# Patient Record
Sex: Female | Born: 1940 | Race: White | Hispanic: No | Smoking: Never smoker
Health system: Southern US, Community
[De-identification: ages and names within clinical notes are randomized; demographics above are authoritative.]

## PROBLEM LIST (undated history)

## (undated) DIAGNOSIS — E119 Type 2 diabetes mellitus without complications: Secondary | ICD-10-CM

## (undated) DIAGNOSIS — G4730 Sleep apnea, unspecified: Secondary | ICD-10-CM

## (undated) DIAGNOSIS — C189 Malignant neoplasm of colon, unspecified: Secondary | ICD-10-CM

## (undated) DIAGNOSIS — I820 Budd-Chiari syndrome: Secondary | ICD-10-CM

## (undated) DIAGNOSIS — G43809 Other migraine, not intractable, without status migrainosus: Secondary | ICD-10-CM

## (undated) DIAGNOSIS — E079 Disorder of thyroid, unspecified: Secondary | ICD-10-CM

## (undated) DIAGNOSIS — K219 Gastro-esophageal reflux disease without esophagitis: Secondary | ICD-10-CM

## (undated) DIAGNOSIS — K55069 Acute infarction of intestine, part and extent unspecified: Secondary | ICD-10-CM

## (undated) DIAGNOSIS — D649 Anemia, unspecified: Secondary | ICD-10-CM

## (undated) HISTORY — PX: UPPER GASTROINTESTINAL ENDOSCOPY: SHX188

## (undated) HISTORY — DX: Other migraine, not intractable, without status migrainosus: G43.809

## (undated) HISTORY — DX: Gastro-esophageal reflux disease without esophagitis: K21.9

## (undated) HISTORY — DX: Disorder of thyroid, unspecified: E07.9

## (undated) HISTORY — DX: Budd-Chiari syndrome: I82.0

## (undated) HISTORY — PX: COLON RESECTION: SHX5231

## (undated) HISTORY — PX: PORTACATH PLACEMENT: SHX2246

## (undated) HISTORY — DX: Type 2 diabetes mellitus without complications: E11.9

## (undated) HISTORY — DX: Acute infarction of intestine, part and extent unspecified: K55.069

## (undated) HISTORY — PX: COLONOSCOPY: SHX174

## (undated) HISTORY — DX: Malignant neoplasm of colon, unspecified: C18.9

## (undated) HISTORY — PX: SHOULDER ARTHROSCOPY: SHX128

## (undated) HISTORY — DX: Anemia, unspecified: D64.9

## (undated) HISTORY — DX: Sleep apnea, unspecified: G47.30

---

## 1985-11-19 HISTORY — PX: ABDOMINAL HYSTERECTOMY: SHX81

## 1986-11-19 DIAGNOSIS — C189 Malignant neoplasm of colon, unspecified: Secondary | ICD-10-CM

## 1986-11-19 HISTORY — DX: Malignant neoplasm of colon, unspecified: C18.9

## 2012-12-02 ENCOUNTER — Encounter: Payer: Self-pay | Admitting: Internal Medicine

## 2012-12-24 ENCOUNTER — Ambulatory Visit (INDEPENDENT_AMBULATORY_CARE_PROVIDER_SITE_OTHER): Payer: MEDICARE | Admitting: Internal Medicine

## 2012-12-24 ENCOUNTER — Encounter: Payer: Self-pay | Admitting: Internal Medicine

## 2012-12-24 VITALS — BP 118/62 | HR 64 | Ht 65.0 in | Wt 184.0 lb

## 2012-12-24 DIAGNOSIS — D509 Iron deficiency anemia, unspecified: Secondary | ICD-10-CM

## 2012-12-24 DIAGNOSIS — R195 Other fecal abnormalities: Secondary | ICD-10-CM

## 2012-12-24 HISTORY — DX: Iron deficiency anemia, unspecified: D50.9

## 2012-12-24 MED ORDER — NA SULFATE-K SULFATE-MG SULF 17.5-3.13-1.6 GM/177ML PO SOLN: ORAL | Status: DC

## 2012-12-24 NOTE — Patient Instructions (Addendum)
You have been scheduled for an endoscopy and colonoscopy with propofol. Please follow the written instructions given to you at your visit today. Please use the suprep kit you have been given today. If you use inhalers (even only as needed) or a CPAP machine, please bring them with you on the day of your procedure.  Thank you for choosing me and [facility].  Iva Boop, M.D., [facility]

## 2012-12-24 NOTE — Progress Notes (Signed)
Subjective:    Patient ID: Cristina Santos, female    DOB: 22-Jun-1941, 72 y.o.   MRN: [id number]  HPI Very nice elderly white woman here with her husband. She has a history of resection of colon cancer 25 years ago at [facility] in [place], [place]. Her daughter works as a Publishing rights manager there. This was followed by one year of chemotherapy. She apparently had no positive disease. In over time her port was not removed, and it remains in. She still follows with a hematologist oncologist and was found to be mildly anemic with microcytosis and had a low ferritin recently. An immune fecal occult blood test was positive. She has not seen any bleeding though indicates that she did have some dark stools at one point though she thought that that was probably related to ingestion of turnip greens. She was started on iron and stools have been dark since then.  Her last colonoscopy was October of 2009 was reportedly normal. She had an EGD at that time that showed fundic gland polyps was otherwise unremarkable. GI review of systems is otherwise negative. He has lost weight for reasons that are unclear to her at this point. She does say her appetite is improved after starting on the iron therapy. Allergies  Allergen Reactions  . Codeine   . Latex    No outpatient prescriptions prior to visit.    Last reviewed on 12/24/2012 10:58 AM by Iva Boop, MD Past Medical History  Diagnosis Date  . Thyroid disease   . GERD (gastroesophageal reflux disease)   . Diabetes   . Colon cancer 1988  . Sleep apnea    Past Surgical History  Procedure Date  . Abdominal hysterectomy 1987  . Shoulder arthroscopy     left  . Portacath placement   . Colon resection   . Upper gastrointestinal endoscopy   . Colonoscopy    History   Social History  . Marital Status:  married     Spouse Name: N/A    Number of Children: 2  . Years of Education: N/A   Occupational History  . retired    Social  History Main Topics  . Smoking status: Never Smoker   . Smokeless tobacco: Never Used  . Alcohol Use: No  . Drug Use: No    Social History Narrative   Married, 1 son one daughter. Daughter is a Publishing rights manager.She is retired2 cups coffee per day   Family History  Problem Relation Age of Onset  . Prostate cancer Brother   . Diabetes Mother   . Diabetes Brother   . Heart attack Brother   . Lung cancer Brother   . Bone cancer Brother   . Alzheimer's disease Mother        Review of Systems As per history of present illness all her systems are negative.    Objective:   Physical Exam General:  Well-developed, well-nourished and in no acute distress Eyes:  anicteric. Neck:   supple w/o thyromegaly or mass.  Lungs: Clear to auscultation bilaterally. Heart:  S1S2, no rubs, murmurs, gallops. Abdomen:  soft, non-tender, no hepatosplenomegaly, hernia, or mass and BS+.  Rectal: Deferred until colonoscopy Lymph:  no cervical or supraclavicular adenopathy. Extremities:   no edema Skin   no rash. Neuro:  A&O x 3.  Psych:  appropriate mood and  Affect.   Data Reviewed: As above Ferritin 22 11/20/12  On 12/08/2012 her hemoglobin was 12 with an MCV of 85 so  she has responded nicely to the iron supplementation she started.       Assessment & Plan:   1. iFOBT + stool  Ambulatory referral to Gastroenterology  2. Iron deficiency anemia  Ambulatory referral to Gastroenterology  3. Personal hx colon cancer resected 25 yrs ago   1. These problems may investigation with colonoscopy and possible upper endoscopy. The immune based fecal occult blood test is specific for colonic hemoglobin. 2. Will start with colonoscopy if that is unrevealing then would pursue an upper GI endoscopy. She believes she eats iron in her diet. The risks and benefits as well as alternatives of endoscopic procedure(s) have been discussed and reviewed. All questions answered. The patient agrees to  proceed.   I appreciate the opportunity to care for this patient.  ZO:XWRUEAV, Esperanza Sheets, MD and Elliot Dally, MD

## 2012-12-30 ENCOUNTER — Encounter: Payer: Self-pay | Admitting: Internal Medicine

## 2012-12-30 ENCOUNTER — Ambulatory Visit (AMBULATORY_SURGERY_CENTER): Payer: MEDICARE | Admitting: Internal Medicine

## 2012-12-30 VITALS — BP 137/62 | HR 61 | Temp 97.0°F | Resp 19 | Ht 65.0 in | Wt 184.0 lb

## 2012-12-30 DIAGNOSIS — D131 Benign neoplasm of stomach: Secondary | ICD-10-CM

## 2012-12-30 DIAGNOSIS — D126 Benign neoplasm of colon, unspecified: Secondary | ICD-10-CM

## 2012-12-30 DIAGNOSIS — D509 Iron deficiency anemia, unspecified: Secondary | ICD-10-CM

## 2012-12-30 DIAGNOSIS — K5730 Diverticulosis of large intestine without perforation or abscess without bleeding: Secondary | ICD-10-CM

## 2012-12-30 DIAGNOSIS — R195 Other fecal abnormalities: Secondary | ICD-10-CM

## 2012-12-30 DIAGNOSIS — Z85038 Personal history of other malignant neoplasm of large intestine: Secondary | ICD-10-CM

## 2012-12-30 DIAGNOSIS — K648 Other hemorrhoids: Secondary | ICD-10-CM

## 2012-12-30 MED ORDER — SODIUM CHLORIDE 0.9 % IV SOLN: 500.0000 mL | INTRAVENOUS | Status: DC

## 2012-12-30 NOTE — Patient Instructions (Addendum)
I found and removed 9 colon polyps, all small or medium size. They look benign. One tiny polyp was not recovered, the others were and will be analyzed by a pathologist.  You also have small internal hemorrhoids and mild diverticulosis.  The upper endoscopy exam showed multiple small polyps (fundic gland polyps) that we knew you had - these are not a problem.  I will let you know results and plans by mail and/or phone.  Thank you for choosing me and [facility].  Iva Boop, MD, FACG   YOU HAD AN ENDOSCOPIC PROCEDURE TODAY AT THE [facility]: Refer to the procedure report that was given to you for any specific questions about what was found during the examination.  If the procedure report does not answer your questions, please call your gastroenterologist to clarify.  If you requested that your care partner not be given the details of your procedure findings, then the procedure report has been included in a sealed envelope for you to review at your convenience later.  YOU SHOULD EXPECT: Some feelings of bloating in the abdomen. Passage of more gas than usual.  Walking can help get rid of the air that was put into your GI tract during the procedure and reduce the bloating. If you had a lower endoscopy (such as a colonoscopy or flexible sigmoidoscopy) you may notice spotting of blood in your stool or on the toilet paper. If you underwent a bowel prep for your procedure, then you may not have a normal bowel movement for a few days.  DIET: Your first meal following the procedure should be a light meal and then it is ok to progress to your normal diet.  A half-sandwich or bowl of soup is an example of a good first meal.  Heavy or fried foods are harder to digest and may make you feel nauseous or bloated.  Likewise meals heavy in dairy and vegetables can cause extra gas to form and this can also increase the bloating.  Drink plenty of fluids but you should avoid alcoholic  beverages for 24 hours.  ACTIVITY: Your care partner should take you home directly after the procedure.  You should plan to take it easy, moving slowly for the rest of the day.  You can resume normal activity the day after the procedure however you should NOT DRIVE or use heavy machinery for 24 hours (because of the sedation medicines used during the test).    SYMPTOMS TO REPORT IMMEDIATELY: A gastroenterologist can be reached at any hour.  During normal business hours, 8:30 AM to 5:00 PM Monday through Friday, call [phone].  After hours and on weekends, please call the GI answering service at [phone] who will take a message and have the physician on call contact you.   Following lower endoscopy (colonoscopy or flexible sigmoidoscopy):  Excessive amounts of blood in the stool  Significant tenderness or worsening of abdominal pains  Swelling of the abdomen that is new, acute  Fever of 100F or higher  Following upper endoscopy (EGD)  Vomiting of blood or coffee ground material  New chest pain or pain under the shoulder blades  Painful or persistently difficult swallowing  New shortness of breath  Fever of 100F or higher  Black, tarry-looking stools  FOLLOW UP: If any biopsies were taken you will be contacted by phone or by letter within the next 1-3 weeks.  Call your gastroenterologist if you have not heard about the biopsies in 3 weeks.  Our staff will call the home number listed on your records the next business day following your procedure to check on you and address any questions or concerns that you may have at that time regarding the information given to you following your procedure. This is a courtesy call and so if there is no answer at the home number and we have not heard from you through the emergency physician on call, we will assume that you have returned to your regular daily activities without incident.  SIGNATURES/CONFIDENTIALITY: You and/or your care partner  have signed paperwork which will be entered into your electronic medical record.  These signatures attest to the fact that that the information above on your After Visit Summary has been reviewed and is understood.  Full responsibility of the confidentiality of this discharge information lies with you and/or your care-partner.  Please continue your normal medications  YOU HAD AN ENDOSCOPIC PROCEDURE TODAY AT THE [facility]: Refer to the procedure report that was given to you for any specific questions about what was found during the examination.  If the procedure report does not answer your questions, please call your gastroenterologist to clarify.  If you requested that your care partner not be given the details of your procedure findings, then the procedure report has been included in a sealed envelope for you to review at your convenience later.  YOU SHOULD EXPECT: Some feelings of bloating in the abdomen. Passage of more gas than usual.  Walking can help get rid of the air that was put into your GI tract during the procedure and reduce the bloating. If you had a lower endoscopy (such as a colonoscopy or flexible sigmoidoscopy) you may notice spotting of blood in your stool or on the toilet paper. If you underwent a bowel prep for your procedure, then you may not have a normal bowel movement for a few days.  DIET: Your first meal following the procedure should be a light meal and then it is ok to progress to your normal diet.  A half-sandwich or bowl of soup is an example of a good first meal.  Heavy or fried foods are harder to digest and may make you feel nauseous or bloated.  Likewise meals heavy in dairy and vegetables can cause extra gas to form and this can also increase the bloating.  Drink plenty of fluids but you should avoid alcoholic beverages for 24 hours.  ACTIVITY: Your care partner should take you home directly after the procedure.  You should plan to take it easy, moving  slowly for the rest of the day.  You can resume normal activity the day after the procedure however you should NOT DRIVE or use heavy machinery for 24 hours (because of the sedation medicines used during the test).    SYMPTOMS TO REPORT IMMEDIATELY: A gastroenterologist can be reached at any hour.  During normal business hours, 8:30 AM to 5:00 PM Monday through Friday, call [phone].  After hours and on weekends, please call the GI answering service at [phone] who will take a message and have the physician on call contact you.   Following lower endoscopy (colonoscopy or flexible sigmoidoscopy):  Excessive amounts of blood in the stool  Significant tenderness or worsening of abdominal pains  Swelling of the abdomen that is new, acute  Fever of 100F or higher  Following upper endoscopy (EGD)  Vomiting of blood or coffee ground material  New chest pain or pain under the shoulder  blades  Painful or persistently difficult swallowing  New shortness of breath  Fever of 100F or higher  Black, tarry-looking stools  FOLLOW UP: If any biopsies were taken you will be contacted by phone or by letter within the next 1-3 weeks.  Call your gastroenterologist if you have not heard about the biopsies in 3 weeks.  Our staff will call the home number listed on your records the next business day following your procedure to check on you and address any questions or concerns that you may have at that time regarding the information given to you following your procedure. This is a courtesy call and so if there is no answer at the home number and we have not heard from you through the emergency physician on call, we will assume that you have returned to your regular daily activities without incident.  SIGNATURES/CONFIDENTIALITY: You and/or your care partner have signed paperwork which will be entered into your electronic medical record.  These signatures attest to the fact that that the information  above on your After Visit Summary has been reviewed and is understood.  Full responsibility of the confidentiality of this discharge information lies with you and/or your care-partner.  Dr. Marvell Fuller office will call you with the colon results and plan

## 2012-12-30 NOTE — Progress Notes (Signed)
Patient did not experience any of the following events: a burn prior to discharge; a fall within the facility; wrong site/side/patient/procedure/implant event; or a hospital transfer or hospital admission upon discharge from the facility. (G8907) Patient did not have preoperative order for IV antibiotic SSI prophylaxis. (G8918)  

## 2012-12-30 NOTE — Progress Notes (Signed)
Lidocaine-40mg IV prior to Propofol InductionPropofol given over incremental dosages 

## 2012-12-30 NOTE — Op Note (Signed)
 [facility] [address] [place], [address]   COLONOSCOPY PROCEDURE REPORT  PATIENT: Cristina Santos, Cristina Santos  MR#: [id number] BIRTHDATE: [birth year] , 71  yrs. old GENDER: Female ENDOSCOPIST: Iva Boop, MD, [facility]  PROCEDURE DATE:  12/30/2012 PROCEDURE:   Colonoscopy with biopsy and snare polypectomy ASA CLASS:   Class II INDICATIONS:Iron Deficiency Anemia, iFOBT + stool, and High risk patient with personal history of colon cancer. MEDICATIONS: propofol (Diprivan) 200mg  IV, MAC sedation, administered by CRNA, and These medications were titrated to patient response per physician's verbal order  DESCRIPTION OF PROCEDURE:   After the risks benefits and alternatives of the procedure were thoroughly explained, informed consent was obtained.  A digital rectal exam revealed no abnormalities of the rectum.   The LB CF-H180AL [id number]  endoscope was introduced through the anus and advanced to the terminal ileum which was intubated for a short distance. No adverse events experienced.   The quality of the prep was Suprep excellent  The instrument was then slowly withdrawn as the colon was fully examined.      COLON FINDINGS: Nine polypoid shaped sessile polyps measuring 1-8 mm in size were found at the cecum (2 cold bx removal), in the ascending colon, transverse colon, at the splenic flexure, in the descending colon, and sigmoid colon (all these cold snare removal). A polypectomy was performed with cold forceps and with a cold snare.  The resection was complete and the polyp tissue was partially retrieved, one polyp from ascending colon (3-22mm) not retrieved. All others retrieved.   There was rare diverticulosis noted in the sigmoid colon.   Small internal hemorrhoids were found.   The mucosa appeared normal in the distal terminal ileum. The colon mucosa was otherwise normal.  Retroflexed views revealed internal hemorrhoids. The time to cecum=2 minutes 40  seconds. Withdrawal time=15 minutes 40 seconds.  The scope was withdrawn and the procedure completed. COMPLICATIONS: There were no complications.  ENDOSCOPIC IMPRESSION: 1.   Nine sessile polyps measuring 1-8 mm in size were found at the cecum, in the ascending colon, transverse colon, at the splenic flexure, in the descending colon, and sigmoid colon; polypectomy was performed with cold forceps and with a cold snare 2.   There was mild diverticulosis noted in the sigmoid colon 3.   Small internal hemorrhoids 4.   Normal mucosa in the distal  terminal ileum 5.   The colon mucosa was otherwise normal - excellent prep  RECOMMENDATIONS: Upper endoscopy next as cannot say cause of anemia found here.   eSigned:  Iva Boop, MD, [facility] 12/30/2012 4:24 PM   cc: The Patient, Donnetta Hutching, MD and Elliot Dally, MD   PATIENT NAME:  Cristina Santos, Cristina Santos MR#: [id number]

## 2012-12-30 NOTE — Op Note (Signed)
[facility] [address] [place], [address]   ENDOSCOPY PROCEDURE REPORT  PATIENT: Cristina, Santos  MR#: [id number] BIRTHDATE: August 08, 1941 , 71  yrs. old GENDER: Female ENDOSCOPIST: Iva Boop, MD, [facility] PROCEDURE DATE:  12/30/2012 PROCEDURE:  EGD, diagnostic ASA CLASS:     Class II INDICATIONS:  Unexplained iron deficiency anemia. MEDICATIONS: propofol (Diprivan) 50mg  IV, MAC sedation, administered by CRNA, These medications were titrated to patient response per physician's verbal order, There was residual sedation effect present from prior procedure, and Robinul 0.2 mg IV TOPICAL ANESTHETIC: Cetacaine Spray  DESCRIPTION OF PROCEDURE: After the risks benefits and alternatives of the procedure were thoroughly explained, informed consent was obtained.  The LB GIF-H180 [id number] endoscope was introduced through the mouth and advanced to the second portion of the duodenum. Without limitations.  The instrument was slowly withdrawn as the mucosa was fully examined.        STOMACH: Multiple diminutive smooth sessile polyps were found in the gastric body and gastric fundus.  The remainder of the upper endoscopy exam was otherwise normal. Retroflexed views revealed no abnormalities.     The scope was then withdrawn from the patient and the procedure completed.  COMPLICATIONS: There were no complications. ENDOSCOPIC IMPRESSION: 1.   Multiple diminutive sessile polyps were found in the gastric body and gastric fundus 2.   The remainder of the upper endoscopy exam was otherwise normal  RECOMMENDATIONS: Office will call with results from colon pathology and plans. will discuss options - could need guaiac-based hemoccults to see if small bowel capsule endoscopy needed - could consider celiac serology also.   eSigned:  Iva Boop, MD, [facility] 12/30/2012 4:31 PM  CC:The Patient  , Donnetta Hutching, MD and Genia Plants, MD

## 2012-12-30 NOTE — Progress Notes (Addendum)
Called to room to assist during endoscopic procedure.  Patient ID and intended procedure confirmed with present staff. Received instructions for my participation in the procedure from the performing physician.  Total of 8 polyps retrieved in colon.

## 2012-12-31 ENCOUNTER — Telehealth: Payer: Self-pay | Admitting: *Deleted

## 2012-12-31 NOTE — Telephone Encounter (Signed)
  Follow up Call-  Call back number 12/30/2012  Post procedure Call Back phone  # [phone]  Permission to leave phone message Yes     Patient questions:  Do you have a fever, pain , or abdominal swelling? no Pain Score  0 *  Have you tolerated food without any problems? yes  Have you been able to return to your normal activities? yes  Do you have any questions about your discharge instructions: Diet   no Medications  no Follow up visit  no  Do you have questions or concerns about your Care? no  Actions: * If pain score is 4 or above: No action needed, pain <4.

## 2013-01-06 ENCOUNTER — Encounter: Payer: Self-pay | Admitting: Internal Medicine

## 2013-01-06 DIAGNOSIS — Z85038 Personal history of other malignant neoplasm of large intestine: Secondary | ICD-10-CM | POA: Insufficient documentation

## 2013-01-06 NOTE — Progress Notes (Signed)
Quick Note:  6 adenomas Repeat colonoscopy 3 years - 12/2015   Office to call  Also needs guaiac-based hemoccults - wait until 3 weeks after colonoscopy to do and TTG Ab and an Ig A level to evaluate iron-deficiency anemia  [facility] no letter - place the recall please ______

## 2013-01-07 ENCOUNTER — Other Ambulatory Visit: Payer: Self-pay

## 2013-01-07 DIAGNOSIS — D649 Anemia, unspecified: Secondary | ICD-10-CM

## 2013-01-26 ENCOUNTER — Other Ambulatory Visit (INDEPENDENT_AMBULATORY_CARE_PROVIDER_SITE_OTHER): Payer: MEDICARE

## 2013-01-26 DIAGNOSIS — D649 Anemia, unspecified: Secondary | ICD-10-CM

## 2013-01-26 LAB — IGA: IgA: 176 mg/dL (ref 68–378)

## 2013-01-27 LAB — TISSUE TRANSGLUTAMINASE, IGA: Tissue Transglutaminase Ab, IgA: 3.8 U/mL (ref <20)

## 2013-01-28 NOTE — Progress Notes (Signed)
Quick Note:  Labs negative for celiac dz Waiting on hemoccults ______

## 2013-02-09 ENCOUNTER — Telehealth: Payer: Self-pay | Admitting: Internal Medicine

## 2013-02-09 NOTE — Telephone Encounter (Signed)
Spoke with patient and gave her results of blood work. She states she mailed in her hemoccult cards also.

## 2013-02-09 NOTE — Telephone Encounter (Signed)
Lab has not received stool cards. Left a message for patient to call me.

## 2013-02-10 NOTE — Telephone Encounter (Signed)
Spoke with patient and told her the lab has not received her cards. Mailed her new cards.

## 2013-02-11 ENCOUNTER — Other Ambulatory Visit: Payer: MEDICARE

## 2013-02-11 DIAGNOSIS — D649 Anemia, unspecified: Secondary | ICD-10-CM

## 2013-02-11 LAB — HEMOCCULT SLIDES (X 3 CARDS)
Fecal Occult Blood: NEGATIVE
OCCULT 1: NEGATIVE
OCCULT 2: NEGATIVE
OCCULT 3: NEGATIVE
OCCULT 4: NEGATIVE
OCCULT 5: NEGATIVE

## 2013-02-12 NOTE — Progress Notes (Signed)
Quick Note:  No blood in stools So I do not recommend further testing Take iron supplements as directed and f/u hematologist and PCP  See me as planned for recall and prn ______

## 2016-01-03 ENCOUNTER — Encounter: Payer: Self-pay | Admitting: Internal Medicine

## 2016-02-29 ENCOUNTER — Ambulatory Visit (AMBULATORY_SURGERY_CENTER): Payer: Self-pay

## 2016-02-29 VITALS — Ht 65.0 in | Wt 200.8 lb

## 2016-02-29 DIAGNOSIS — C189 Malignant neoplasm of colon, unspecified: Secondary | ICD-10-CM

## 2016-02-29 NOTE — Progress Notes (Signed)
No allergies to eggs or soy No past problems with anesthesia No diet/weight loss meds No home oxygen  Has email and internet; refused emmi 

## 2016-03-14 ENCOUNTER — Ambulatory Visit (AMBULATORY_SURGERY_CENTER): Payer: MEDICARE | Admitting: Internal Medicine

## 2016-03-14 ENCOUNTER — Encounter: Payer: Self-pay | Admitting: Internal Medicine

## 2016-03-14 VITALS — BP 131/91 | HR 54 | Temp 97.8°F | Resp 18 | Ht 65.0 in | Wt 200.0 lb

## 2016-03-14 DIAGNOSIS — D120 Benign neoplasm of cecum: Secondary | ICD-10-CM | POA: Diagnosis not present

## 2016-03-14 DIAGNOSIS — D124 Benign neoplasm of descending colon: Secondary | ICD-10-CM

## 2016-03-14 DIAGNOSIS — Z86010 Personal history of colonic polyps: Secondary | ICD-10-CM

## 2016-03-14 MED ORDER — SODIUM CHLORIDE 0.9 % IV SOLN: 500.0000 mL | INTRAVENOUS | Status: DC

## 2016-03-14 NOTE — Progress Notes (Signed)
Called to room to assist during endoscopic procedure.  Patient ID and intended procedure confirmed with present staff. Received instructions for my participation in the procedure from the performing physician.  

## 2016-03-14 NOTE — Patient Instructions (Addendum)
I found and removed 3 tiny polyps. I will let you know pathology results and when/if to have another routine colonoscopy by mail. Not sure you will need another at 34.  I appreciate the opportunity to care for you. Gatha Mayer, MD, [facility]   Discharge instructions given. Handouts on polyps and diverticulosis. Resume previous medications. YOU HAD AN ENDOSCOPIC PROCEDURE TODAY AT [facility]:   Refer to the procedure report that was given to you for any specific questions about what was found during the examination.  If the procedure report does not answer your questions, please call your gastroenterologist to clarify.  If you requested that your care partner not be given the details of your procedure findings, then the procedure report has been included in a sealed envelope for you to review at your convenience later.  YOU SHOULD EXPECT: Some feelings of bloating in the abdomen. Passage of more gas than usual.  Walking can help get rid of the air that was put into your GI tract during the procedure and reduce the bloating. If you had a lower endoscopy (such as a colonoscopy or flexible sigmoidoscopy) you may notice spotting of blood in your stool or on the toilet paper. If you underwent a bowel prep for your procedure, you may not have a normal bowel movement for a few days.  Please Note:  You might notice some irritation and congestion in your nose or some drainage.  This is from the oxygen used during your procedure.  There is no need for concern and it should clear up in a day or so.  SYMPTOMS TO REPORT IMMEDIATELY:   Following lower endoscopy (colonoscopy or flexible sigmoidoscopy):  Excessive amounts of blood in the stool  Significant tenderness or worsening of abdominal pains  Swelling of the abdomen that is new, acute  Fever of 100F or higher  For urgent or emergent issues, a gastroenterologist can be reached at any hour by calling [phone].   DIET:  Your first meal following the procedure should be a small meal and then it is ok to progress to your normal diet. Heavy or fried foods are harder to digest and may make you feel nauseous or bloated.  Likewise, meals heavy in dairy and vegetables can increase bloating.  Drink plenty of fluids but you should avoid alcoholic beverages for 24 hours.  ACTIVITY:  You should plan to take it easy for the rest of today and you should NOT DRIVE or use heavy machinery until tomorrow (because of the sedation medicines used during the test).    FOLLOW UP: Our staff will call the number listed on your records the next business day following your procedure to check on you and address any questions or concerns that you may have regarding the information given to you following your procedure. If we do not reach you, we will leave a message.  However, if you are feeling well and you are not experiencing any problems, there is no need to return our call.  We will assume that you have returned to your regular daily activities without incident.  If any biopsies were taken you will be contacted by phone or by letter within the next 1-3 weeks.  Please call us at [phone] if you have not heard about the biopsies in 3 weeks.    SIGNATURES/CONFIDENTIALITY: You and/or your care partner have signed paperwork which will be entered into your electronic medical record.  These signatures attest to the  fact that that the information above on your After Visit Summary has been reviewed and is understood.  Full responsibility of the confidentiality of this discharge information lies with you and/or your care-partner.

## 2016-03-14 NOTE — Progress Notes (Signed)
A and Ox 3 Report to RN 

## 2016-03-14 NOTE — Op Note (Signed)
[facility] Patient Name: Cristina Santos Procedure Date: 03/14/2016 11:24 AM MRN: [id number] Endoscopist: Gatha Mayer , MD Age: 75 Date of Birth: 01/11/1941 Gender: Female Procedure:                Colonoscopy Indications:              Surveillance: Personal history of adenomatous                            polyps on last colonoscopy 3 years ago Medicines:                Propofol per Anesthesia, Monitored Anesthesia Care Procedure:                Pre-Anesthesia Assessment:                           - Prior to the procedure, a History and Physical                            was performed, and patient medications and                            allergies were reviewed. The patient's tolerance of                            previous anesthesia was also reviewed. The risks                            and benefits of the procedure and the sedation                            options and risks were discussed with the patient.                            All questions were answered, and informed consent                            was obtained. Prior Anticoagulants: The patient has                            taken no previous anticoagulant or antiplatelet                            agents. ASA Grade Assessment: II - A patient with                            mild systemic disease. After reviewing the risks                            and benefits, the patient was deemed in                            satisfactory condition to undergo the procedure.  After obtaining informed consent, the colonoscope                            was passed under direct vision. Throughout the                            procedure, the patient's blood pressure, pulse, and                            oxygen saturations were monitored continuously. The                            Model CF-HQ190L [id number]) scope was introduced                            through the anus and advanced to the  the cecum,                            identified by appendiceal orifice and ileocecal                            valve. The colonoscopy was performed without                            difficulty. The patient tolerated the procedure                            well. The quality of the bowel preparation was                            excellent. The bowel preparation used was Miralax.                            The ileocecal valve, appendiceal orifice, and                            rectum were photographed. Scope In: 11:35:29 AM Scope Out: 11:52:02 AM Scope Withdrawal Time: 0 hours 14 minutes 45 seconds  Total Procedure Duration: 0 hours 16 minutes 33 seconds  Findings:                 The perianal and digital rectal examinations were                            normal.                           A 2 mm polyp was found in the cecum. The polyp was                            sessile. The polyp was removed with a cold biopsy                            forceps. Resection and retrieval were complete.  Verification of patient identification for the                            specimen was done. Estimated blood loss was minimal.                           Two sessile polyps were found in the descending                            colon. The polyps were 3 to 5 mm in size. These                            polyps were removed with a cold snare. Resection                            and retrieval were complete. Verification of                            patient identification for the specimen was done.                            Estimated blood loss was minimal.                           Scattered diverticula were found in the sigmoid                            colon.                           The exam was otherwise without abnormality on                            direct and retroflexion views. Complications:            No immediate complications. Estimated Blood Loss:      Estimated blood loss was minimal. Impression:               - One 2 mm polyp in the cecum, removed with a cold                            biopsy forceps. Resected and retrieved.                           - Two 3 to 5 mm polyps in the descending colon,                            removed with a cold snare. Resected and retrieved.                           - The examination was otherwise normal on direct                            and retroflexion views. Recommendation:           -  Patient has a contact number available for                            emergencies. The signs and symptoms of potential                            delayed complications were discussed with the                            patient. Return to normal activities tomorrow.                            Written discharge instructions were provided to the                            patient.                           - Resume previous diet.                           - Continue present medications.                           - Repeat colonoscopy is to be considered .Probably                            does not need routine repeat colonoscopy w/                            findings and curent age. This will be determined                            after pathology results from today's exam become                            available for review. Gatha Mayer, MD 03/14/2016 12:03:52 PM This report has been signed electronically. CC Letter to:             Margit Banda. Josepha Pigg

## 2016-03-15 ENCOUNTER — Telehealth: Payer: Self-pay

## 2016-03-15 NOTE — Telephone Encounter (Signed)
  Follow up Call-  Call back number 03/14/2016  Post procedure Call Back phone  # [phone]  Permission to leave phone message Yes     Patient questions:  Do you have a fever, pain , or abdominal swelling? No. Pain Score  0 *  Have you tolerated food without any problems? Yes.    Have you been able to return to your normal activities? Yes.    Do you have any questions about your discharge instructions: Diet   No. Medications  No. Follow up visit  No.  Do you have questions or concerns about your Care? No.  Actions: * If pain score is 4 or above: No action needed, pain <4.

## 2016-03-19 ENCOUNTER — Encounter: Payer: Self-pay | Admitting: Internal Medicine

## 2016-03-19 DIAGNOSIS — Z85038 Personal history of other malignant neoplasm of large intestine: Secondary | ICD-10-CM

## 2016-03-19 NOTE — Progress Notes (Signed)
Quick Note:  Correction - no recall due to age + findings ______

## 2016-03-19 NOTE — Progress Notes (Signed)
Quick Note:  3 diminutive adenomas Recall 2020 ______

## 2019-04-23 ENCOUNTER — Other Ambulatory Visit: Payer: Self-pay

## 2019-05-27 NOTE — H&P (Signed)
TOTAL KNEE ADMISSION H&P  Patient is being admitted for left total knee arthroplasty.  Subjective:  Chief Complaint:left knee pain.  HPI: CHAMPAGNE PALETTA, 78 y.o. female, has a history of pain and functional disability in the left knee due to arthritis and has failed non-surgical conservative treatments for greater than 12 weeks to includecorticosteriod injections, viscosupplementation injections and activity modification.  Onset of symptoms was gradual, starting several years ago with gradually worsening course since that time. The patient noted no past surgery on the left knee(s).  Patient currently rates pain in the left knee(s) at 7 out of 10 with activity. Patient has worsening of pain with activity and weight bearing, crepitus and instability.  Patient has evidence of bone-on-bone arthritis in the medial and patellofemoral compartments of the left knee by imaging studies. There is no active infection.  Patient Active Problem List   Diagnosis Date Noted  . History of malignant neoplasm of large intestine 01/06/2013  . Iron deficiency anemia 12/24/2012  . iFOBT + stool 12/24/2012   Past Medical History:  Diagnosis Date  . Anemia   . Colon cancer ([place]) 1988  . Diabetes ([place])   . GERD (gastroesophageal reflux disease)   . Sleep apnea   . Thyroid disease     Past Surgical History:  Procedure Laterality Date  . ABDOMINAL HYSTERECTOMY  1987  . COLON RESECTION    . COLONOSCOPY    . PORTACATH PLACEMENT    . SHOULDER ARTHROSCOPY     left  . UPPER GASTROINTESTINAL ENDOSCOPY      No current facility-administered medications for this encounter.    Current Outpatient Medications  Medication Sig Dispense Refill Last Dose  . Biotin (BIOTIN MAXIMUM STRENGTH) 10 MG TABS Take by mouth.   03/13/2016  . Cholecalciferol (D3 ADULT PO) Take 1 tablet by mouth daily. Reported on 03/14/2016   Not Taking  . esomeprazole (NEXIUM) 40 MG capsule Take 40 mg by mouth daily before breakfast.   03/14/2016  .  fish oil-omega-3 fatty acids 1000 MG capsule Take 2 g by mouth daily.   Past Week  . levothyroxine (SYNTHROID, LEVOTHROID) 50 MCG tablet Take 50 mcg by mouth daily.   03/14/2016  . MAGNESIUM PO Take 1 tablet by mouth daily.   03/13/2016  . zolpidem (AMBIEN) 5 MG tablet Take 5 mg by mouth at bedtime as needed.   03/13/2016   Allergies  Allergen Reactions  . Codeine   . Latex     Social History   Tobacco Use  . Smoking status: Never Smoker  . Smokeless tobacco: Never Used  Substance Use Topics  . Alcohol use: No    Family History  Problem Relation Age of Onset  . Prostate cancer Brother   . Lung cancer Brother   . Heart disease Brother   . Diabetes Mother   . Alzheimer's disease Mother   . Diabetes Brother   . Heart attack Brother   . Lung cancer Brother   . Bone cancer Brother   . Colon cancer Neg Hx      Review of Systems  Constitutional: Negative for chills and fever.  HENT: Negative for congestion, sore throat and tinnitus.   Eyes: Negative for double vision, photophobia and pain.  Respiratory: Negative for cough, shortness of breath and wheezing.   Cardiovascular: Negative for chest pain, palpitations and orthopnea.  Gastrointestinal: Negative for heartburn, nausea and vomiting.  Genitourinary: Negative for dysuria, frequency and urgency.  Musculoskeletal: Positive for joint pain.  Neurological:  Negative for dizziness, weakness and headaches.    Objective:  Physical Exam  Well nourished and well developed.  General: Alert and oriented x3, cooperative and pleasant, no acute distress.  Head: normocephalic, atraumatic, neck supple.  Eyes: EOMI.  Respiratory: breath sounds clear in all fields, no wheezing, rales, or rhonchi. Cardiovascular: Regular rate and rhythm, no murmurs, gallops or rubs.  Abdomen: non-tender to palpation and soft, normoactive bowel sounds. Musculoskeletal:  Left Knee Exam:  No effusion.  Range of motion is 5-125 degrees.  No crepitus on  range of motion of the knee.  Positive medial joint line tenderness. No lateral joint line tenderness.  Stable knee.  Calves soft and nontender. Motor function intact in LE. Strength 5/5 LE bilaterally. Neuro: Distal pulses 2+. Sensation to light touch intact in LE.  Vital signs in last 24 hours: Blood pressure: 144/74 mmHg Pulse: 64 bpm  Labs:   Estimated body mass index is 33.28 kg/m as calculated from the following:   Height as of 03/14/16: 5\' 5"  (1.651 m).   Weight as of 03/14/16: 90.7 kg.   Imaging Review Plain radiographs demonstrate severe degenerative joint disease of the left knee(s). The overall alignment isneutral. The bone quality appears to be adequate for age and reported activity level.  Assessment/Plan:  End stage arthritis, left knee   The patient history, physical examination, clinical judgment of the provider and imaging studies are consistent with end stage degenerative joint disease of the left knee(s) and total knee arthroplasty is deemed medically necessary. The treatment options including medical management, injection therapy arthroscopy and arthroplasty were discussed at length. The risks and benefits of total knee arthroplasty were presented and reviewed. The risks due to aseptic loosening, infection, stiffness, patella tracking problems, thromboembolic complications and other imponderables were discussed. The patient acknowledged the explanation, agreed to proceed with the plan and consent was signed. Patient is being admitted for inpatient treatment for surgery, pain control, PT, OT, prophylactic antibiotics, VTE prophylaxis, progressive ambulation and ADL's and discharge planning. The patient is planning to be discharged home.  Anticipated LOS equal to or greater than 2 midnights due to - Age 61 and older with one or more of the following:  - Obesity  - Expected need for hospital services (PT, OT, Nursing) required for safe  discharge  - Anticipated need  for postoperative skilled nursing care or inpatient rehab  - Active co-morbidities: Diabetes OR   - Unanticipated findings during/Post Surgery: None  - Patient is a high risk of re-admission due to: None  Therapy Plans: Outpatient therapy at [facility] in [place] Disposition: Home with daughter Planned DVT Prophylaxis: Xarelto 10 mg daily (hx colon cancer) DME needed: None PCP: Ihor Gully, MD TXA: IV Allergies: Codeine (irregular heart rate), latex (throat swelling) Anesthesia Concerns: None BMI: 32.8 Last HgbA1c: 6.2% Other: Hx thrombocytopenia, most recent platelet count 124  - Patient was instructed on what medications to stop prior to surgery. - Follow-up visit in 2 weeks with Dr. Wynelle Link - Begin physical therapy following surgery - Pre-operative lab work as pre-surgical testing - Prescriptions will be provided in hospital at time of discharge  Theresa Duty, PA-C Orthopedic Surgery [facility] [place]

## 2019-06-02 ENCOUNTER — Other Ambulatory Visit (HOSPITAL_COMMUNITY): Payer: Self-pay | Admitting: *Deleted

## 2019-06-02 ENCOUNTER — Encounter (HOSPITAL_COMMUNITY): Payer: Self-pay

## 2019-06-02 NOTE — Patient Instructions (Addendum)
YOU NEED TO HAVE A COVID 19 TEST ON 06-04-2019 AT 1210 PM.  THIS TEST MUST BE DONE BEFORE SURGERY, COME TO [facility] ENTRANCE. ONCE YOUR COVID TEST IS COMPLETED, PLEASE BEGIN THE QUARANTINE INSTRUCTIONS AS OUTLINED IN YOUR HANDOUT.                Cristina Santos    Your procedure is scheduled on: 06-08-2019   Report to [facility] Main  Entrance    Report to admitting at 6:55  AM    Call this number if you have problems the morning of surgery [phone]    PLEASE [employer] CPAP MACHINE   Remember:  NO SOLID FOOD AFTER MIDNIGHT THE NIGHT PRIOR TO SURGERY. NOTHING BY MOUTH EXCEPT CLEAR LIQUIDS UNTIL 625AM. PLEASE FINISH G2  DRINK PER SURGEON ORDER 3 HOURS PRIOR TO SCHEDULED SURGERY TIME WHICH NEEDS TO BE COMPLETED AT 6:25 AM.  CLEAR LIQUID DIET   Foods Allowed                                                                     Foods Excluded  Coffee and tea, regular and decaf                             liquids that you cannot  Plain Jell-O in any flavor                                             see through such as: Fruit ices (not with fruit pulp)                                     milk, soups, orange juice  Iced Popsicles                                    All solid food Carbonated beverages, regular and diet                                    Cranberry, grape and apple juices Sports drinks like Gatorade Lightly seasoned clear broth or consume(fat free) Sugar, honey syrup  Sample Menu Breakfast                                Lunch                                     Supper Cranberry juice                    Beef broth  Chicken broth Jell-O                                     Grape juice                           Apple juice Coffee or tea                        Jell-O                                      Popsicle                                                Coffee or tea                         Coffee or tea  _____________________________________________________________________     Take these medicines the morning of surgery with A SIP OF WATER: Levothyroxine (Synthroid), and Pantoprazole (Protonix)   BRUSH YOUR TEETH MORNING OF SURGERY AND RINSE YOUR MOUTH OUT, NO CHEWING GUM CANDY OR MINTS.                               You may not have any metal on your body including hair pins and              piercings     Do not wear jewelry, make-up, lotions, powders or perfumes, deodorant              Do not wear nail polish.  Do not shave  48 hours prior to surgery.                 Do not bring valuables to the hospital. [place].  Contacts, dentures or bridgework may not be worn into surgery.     _____________________________________________________________________             [facility] - Preparing for Surgery Before surgery, you can play an important role.  Because skin is not sterile, your skin needs to be as free of germs as possible.  You can reduce the number of germs on your skin by washing with CHG (chlorahexidine gluconate) soap before surgery.  CHG is an antiseptic cleaner which kills germs and bonds with the skin to continue killing germs even after washing. Please DO NOT use if you have an allergy to CHG or antibacterial soaps.  If your skin becomes reddened/irritated stop using the CHG and inform your nurse when you arrive at Short Stay. Do not shave (including legs and underarms) for at least 48 hours prior to the first CHG shower.  You may shave your face/neck. Please follow these instructions carefully:  1.  Shower with CHG Soap the night before surgery and the  morning of Surgery.  2.  If you choose to wash your hair, wash your hair first as usual with your  normal  shampoo.  3.  After you shampoo, rinse your hair and body thoroughly to remove the  shampoo.                           4.  Use CHG as you  would any other liquid soap.  You can apply chg directly  to the skin and wash                       Gently with a scrungie or clean washcloth.  5.  Apply the CHG Soap to your body ONLY FROM THE NECK DOWN.   Do not use on face/ open                           Wound or open sores. Avoid contact with eyes, ears mouth and genitals (private parts).                       Wash face,  Genitals (private parts) with your normal soap.             6.  Wash thoroughly, paying special attention to the area where your surgery  will be performed.  7.  Thoroughly rinse your body with warm water from the neck down.  8.  DO NOT shower/wash with your normal soap after using and rinsing off  the CHG Soap.                9.  Pat yourself dry with a clean towel.            10.  Wear clean pajamas.            11.  Place clean sheets on your bed the night of your first shower and do not  sleep with pets. Day of Surgery : Do not apply any lotions/deodorants the morning of surgery.  Please wear clean clothes to the hospital/surgery center.  FAILURE TO FOLLOW THESE INSTRUCTIONS MAY RESULT IN THE CANCELLATION OF YOUR SURGERY PATIENT SIGNATURE_________________________________  NURSE SIGNATURE__________________________________  ________________________________________________________________________   [name]  An incentive spirometer is a tool that can help keep your lungs clear and active. This tool measures how well you are filling your lungs with each breath. Taking long deep breaths may help reverse or decrease the chance of developing breathing (pulmonary) problems (especially infection) following:  A long period of time when you are unable to move or be active. BEFORE THE PROCEDURE   If the spirometer includes an indicator to show your best effort, your nurse or respiratory therapist will set it to a desired goal.  If possible, sit up straight or lean slightly forward. Try not to slouch.  Hold  the incentive spirometer in an upright position. INSTRUCTIONS FOR USE  1. Sit on the edge of your bed if possible, or sit up as far as you can in bed or on a chair. 2. Hold the incentive spirometer in an upright position. 3. Breathe out normally. 4. Place the mouthpiece in your mouth and seal your lips tightly around it. 5. Breathe in slowly and as deeply as possible, raising the piston or the ball toward the top of the column. 6. Hold your breath for 3-5 seconds or for as long as possible. Allow the piston or ball to fall to the bottom of the column. 7. Remove the mouthpiece from your mouth and breathe out normally. 8. Rest for  a few seconds and repeat Steps 1 through 7 at least 10 times every 1-2 hours when you are awake. Take your time and take a few normal breaths between deep breaths. 9. The spirometer may include an indicator to show your best effort. Use the indicator as a goal to work toward during each repetition. 10. After each set of 10 deep breaths, practice coughing to be sure your lungs are clear. If you have an incision (the cut made at the time of surgery), support your incision when coughing by placing a pillow or rolled up towels firmly against it. Once you are able to get out of bed, walk around indoors and cough well. You may stop using the incentive spirometer when instructed by your caregiver.  RISKS AND COMPLICATIONS  Take your time so you do not get dizzy or light-headed.  If you are in pain, you may need to take or ask for pain medication before doing incentive spirometry. It is harder to take a deep breath if you are having pain. AFTER USE  Rest and breathe slowly and easily.  It can be helpful to keep track of a log of your progress. Your caregiver can provide you with a simple table to help with this. If you are using the spirometer at home, follow these instructions: [place] IF:   You are having difficultly using the spirometer.  You have trouble  using the spirometer as often as instructed.  Your pain medication is not giving enough relief while using the spirometer.  You develop fever of 100.5 F (38.1 C) or higher. SEEK IMMEDIATE MEDICAL CARE IF:   You cough up bloody sputum that had not been present before.  You develop fever of 102 F (38.9 C) or greater.  You develop worsening pain at or near the incision site. MAKE SURE YOU:   Understand these instructions.  Will watch your condition.  Will get help right away if you are not doing well or get worse. Document Released: 03/18/2007 Document Revised: 01/28/2012 Document Reviewed: 05/19/2007 ExitCare Patient Information 2014 ExitCare, [place].   ________________________________________________________________________  WHAT IS A BLOOD TRANSFUSION? Blood Transfusion Information  A transfusion is the replacement of blood or some of its parts. Blood is made up of multiple cells which provide different functions.  Red blood cells carry oxygen and are used for blood loss replacement.  White blood cells fight against infection.  Platelets control bleeding.  Plasma helps clot blood.  Other blood products are available for specialized needs, such as hemophilia or other clotting disorders. BEFORE THE TRANSFUSION  Who gives blood for transfusions?   Healthy volunteers who are fully evaluated to make sure their blood is safe. This is blood bank blood. Transfusion therapy is the safest it has ever been in the practice of medicine. Before blood is taken from a donor, a complete history is taken to make sure that person has no history of diseases nor engages in risky social behavior (examples are intravenous drug use or sexual activity with multiple partners). The donor's travel history is screened to minimize risk of transmitting infections, such as malaria. The donated blood is tested for signs of infectious diseases, such as HIV and hepatitis. The blood is then tested to be  sure it is compatible with you in order to minimize the chance of a transfusion reaction. If you or a relative donates blood, this is often done in anticipation of surgery and is not appropriate for emergency situations. It takes many days  to process the donated blood. RISKS AND COMPLICATIONS Although transfusion therapy is very safe and saves many lives, the main dangers of transfusion include:   Getting an infectious disease.  Developing a transfusion reaction. This is an allergic reaction to something in the blood you were given. Every precaution is taken to prevent this. The decision to have a blood transfusion has been considered carefully by your caregiver before blood is given. Blood is not given unless the benefits outweigh the risks. AFTER THE TRANSFUSION  Right after receiving a blood transfusion, you will usually feel much better and more energetic. This is especially true if your red blood cells have gotten low (anemic). The transfusion raises the level of the red blood cells which carry oxygen, and this usually causes an energy increase.  The nurse administering the transfusion will monitor you carefully for complications. HOME CARE INSTRUCTIONS  No special instructions are needed after a transfusion. You may find your energy is better. Speak with your caregiver about any limitations on activity for underlying diseases you may have. SEEK MEDICAL CARE IF:   Your condition is not improving after your transfusion.  You develop redness or irritation at the intravenous (IV) site. SEEK IMMEDIATE MEDICAL CARE IF:  Any of the following symptoms occur over the next 12 hours:  Shaking chills.  You have a temperature by mouth above 102 F (38.9 C), not controlled by medicine.  Chest, back, or muscle pain.  People around you feel you are not acting correctly or are confused.  Shortness of breath or difficulty breathing.  Dizziness and fainting.  You get a rash or develop  hives.  You have a decrease in urine output.  Your urine turns a dark color or changes to pink, red, or brown. Any of the following symptoms occur over the next 10 days:  You have a temperature by mouth above 102 F (38.9 C), not controlled by medicine.  Shortness of breath.  Weakness after normal activity.  The white part of the eye turns yellow (jaundice).  You have a decrease in the amount of urine or are urinating less often.  Your urine turns a dark color or changes to pink, red, or brown. Document Released: 11/02/2000 Document Revised: 01/28/2012 Document Reviewed: 06/21/2008 [facility] Patient Information 2014 [place], [place].  _______________________________________________________________________

## 2019-06-04 ENCOUNTER — Encounter (HOSPITAL_COMMUNITY)
Admission: RE | Admit: 2019-06-04 | Discharge: 2019-06-04 | Disposition: A | Discharge status: Home / Self Care | Payer: MEDICARE | Source: Ambulatory Visit | Attending: Orthopedic Surgery | Admitting: Orthopedic Surgery

## 2019-06-04 ENCOUNTER — Other Ambulatory Visit (HOSPITAL_COMMUNITY)
Admission: RE | Admit: 2019-06-04 | Discharge: 2019-06-04 | Disposition: A | Discharge status: Home / Self Care | Payer: MEDICARE | Source: Ambulatory Visit | Attending: Orthopedic Surgery | Admitting: Orthopedic Surgery

## 2019-06-04 ENCOUNTER — Encounter (HOSPITAL_COMMUNITY): Payer: Self-pay

## 2019-06-04 ENCOUNTER — Other Ambulatory Visit: Payer: Self-pay

## 2019-06-04 DIAGNOSIS — Z01812 Encounter for preprocedural laboratory examination: Secondary | ICD-10-CM | POA: Insufficient documentation

## 2019-06-04 DIAGNOSIS — Z1159 Encounter for screening for other viral diseases: Secondary | ICD-10-CM | POA: Diagnosis not present

## 2019-06-04 DIAGNOSIS — I10 Essential (primary) hypertension: Secondary | ICD-10-CM | POA: Insufficient documentation

## 2019-06-04 DIAGNOSIS — M1712 Unilateral primary osteoarthritis, left knee: Secondary | ICD-10-CM | POA: Diagnosis not present

## 2019-06-04 LAB — COMPREHENSIVE METABOLIC PANEL
ALT: 26 U/L (ref 0–44)
AST: 25 U/L (ref 15–41)
Albumin: 4.4 g/dL (ref 3.5–5.0)
Alkaline Phosphatase: 69 U/L (ref 38–126)
Anion gap: 12 (ref 5–15)
BUN: 13 mg/dL (ref 8–23)
CO2: 27 mmol/L (ref 22–32)
Calcium: 9.6 mg/dL (ref 8.9–10.3)
Chloride: 94 mmol/L — ABNORMAL LOW (ref 98–111)
Creatinine, Ser: 0.77 mg/dL (ref 0.44–1.00)
GFR calc Af Amer: >60 mL/min (ref >60)
GFR calc non Af Amer: >60 mL/min (ref >60)
Glucose, Bld: 103 mg/dL — ABNORMAL HIGH (ref 70–99)
Potassium: 3.5 mmol/L (ref 3.5–5.1)
Sodium: 133 mmol/L — ABNORMAL LOW (ref 135–145)
Total Bilirubin: 1.1 mg/dL (ref 0.3–1.2)
Total Protein: 7.5 g/dL (ref 6.5–8.1)

## 2019-06-04 LAB — SURGICAL PCR SCREEN
MRSA, PCR: NEGATIVE
Staphylococcus aureus: NEGATIVE

## 2019-06-04 LAB — CBC
HCT: 37.2 % (ref 36.0–46.0)
Hemoglobin: 11.8 g/dL — ABNORMAL LOW (ref 12.0–15.0)
MCH: 26.6 pg (ref 26.0–34.0)
MCHC: 31.7 g/dL (ref 30.0–36.0)
MCV: 84 fL (ref 80.0–100.0)
Platelets: 135 10*3/uL — ABNORMAL LOW (ref 150–400)
RBC: 4.43 MIL/uL (ref 3.87–5.11)
RDW: 14.5 % (ref 11.5–15.5)
WBC: 4.3 10*3/uL (ref 4.0–10.5)
nRBC: 0 % (ref 0.0–0.2)

## 2019-06-04 LAB — PROTIME-INR
INR: 1.1 (ref 0.8–1.2)
Prothrombin Time: 14.1 seconds (ref 11.4–15.2)

## 2019-06-04 LAB — APTT: aPTT: 31 seconds (ref 24–36)

## 2019-06-04 LAB — ABO/RH: ABO/RH(D): O POS

## 2019-06-04 LAB — GLUCOSE, CAPILLARY: Glucose-Capillary: 117 mg/dL — ABNORMAL HIGH (ref 70–99)

## 2019-06-04 NOTE — Progress Notes (Addendum)
04-10-19 EKG and  HGA1C on chart.

## 2019-06-05 LAB — SARS CORONAVIRUS 2 (TAT 6-24 HRS): SARS Coronavirus 2: NEGATIVE

## 2019-06-07 MED ORDER — BUPIVACAINE LIPOSOME 1.3 % IJ SUSP
20.0000 mL | Freq: Once | INTRAMUSCULAR | Status: AC
  Filled 2019-06-07: qty 20

## 2019-06-08 ENCOUNTER — Encounter (HOSPITAL_COMMUNITY)
Admission: RE | Disposition: A | Discharge status: Home / Self Care | Payer: Self-pay | Source: Ambulatory Visit | Attending: Orthopedic Surgery

## 2019-06-08 ENCOUNTER — Other Ambulatory Visit: Payer: Self-pay

## 2019-06-08 ENCOUNTER — Inpatient Hospital Stay (HOSPITAL_COMMUNITY): Payer: MEDICARE | Admitting: Anesthesiology

## 2019-06-08 ENCOUNTER — Encounter (HOSPITAL_COMMUNITY): Payer: Self-pay | Admitting: Emergency Medicine

## 2019-06-08 ENCOUNTER — Observation Stay (HOSPITAL_COMMUNITY)
Admission: RE | Admit: 2019-06-08 | Discharge: 2019-06-09 | Disposition: A | Discharge status: Home / Self Care | Payer: MEDICARE | Source: Ambulatory Visit | Attending: Orthopedic Surgery | Admitting: Orthopedic Surgery

## 2019-06-08 ENCOUNTER — Inpatient Hospital Stay (HOSPITAL_COMMUNITY): Payer: MEDICARE | Admitting: Emergency Medicine

## 2019-06-08 DIAGNOSIS — G4730 Sleep apnea, unspecified: Secondary | ICD-10-CM | POA: Diagnosis not present

## 2019-06-08 DIAGNOSIS — K219 Gastro-esophageal reflux disease without esophagitis: Secondary | ICD-10-CM | POA: Insufficient documentation

## 2019-06-08 DIAGNOSIS — M1712 Unilateral primary osteoarthritis, left knee: Secondary | ICD-10-CM | POA: Diagnosis present

## 2019-06-08 DIAGNOSIS — E669 Obesity, unspecified: Secondary | ICD-10-CM | POA: Insufficient documentation

## 2019-06-08 DIAGNOSIS — Z6832 Body mass index (BMI) 32.0-32.9, adult: Secondary | ICD-10-CM | POA: Diagnosis not present

## 2019-06-08 DIAGNOSIS — D649 Anemia, unspecified: Secondary | ICD-10-CM | POA: Insufficient documentation

## 2019-06-08 DIAGNOSIS — E119 Type 2 diabetes mellitus without complications: Secondary | ICD-10-CM | POA: Insufficient documentation

## 2019-06-08 DIAGNOSIS — E039 Hypothyroidism, unspecified: Secondary | ICD-10-CM | POA: Diagnosis not present

## 2019-06-08 DIAGNOSIS — Z79899 Other long term (current) drug therapy: Secondary | ICD-10-CM | POA: Insufficient documentation

## 2019-06-08 DIAGNOSIS — Z79890 Hormone replacement therapy (postmenopausal): Secondary | ICD-10-CM | POA: Diagnosis not present

## 2019-06-08 DIAGNOSIS — Z85038 Personal history of other malignant neoplasm of large intestine: Secondary | ICD-10-CM | POA: Diagnosis not present

## 2019-06-08 DIAGNOSIS — M179 Osteoarthritis of knee, unspecified: Secondary | ICD-10-CM | POA: Diagnosis present

## 2019-06-08 DIAGNOSIS — M1710 Unilateral primary osteoarthritis, unspecified knee: Secondary | ICD-10-CM | POA: Diagnosis present

## 2019-06-08 HISTORY — PX: TOTAL KNEE ARTHROPLASTY: SHX125

## 2019-06-08 LAB — TYPE AND SCREEN
ABO/RH(D): O POS
Antibody Screen: NEGATIVE

## 2019-06-08 LAB — GLUCOSE, CAPILLARY
Glucose-Capillary: 119 mg/dL — ABNORMAL HIGH (ref 70–99)
Glucose-Capillary: 106 mg/dL — ABNORMAL HIGH (ref 70–99)

## 2019-06-08 SURGERY — ARTHROPLASTY, KNEE, TOTAL
Anesthesia: Regional | Site: Knee | Laterality: Left

## 2019-06-08 MED ORDER — BUPIVACAINE IN DEXTROSE 0.75-8.25 % IT SOLN
INTRATHECAL | Status: DC | PRN
  Administered 2019-06-08: 1.6 mL via INTRATHECAL

## 2019-06-08 MED ORDER — PROPOFOL 10 MG/ML IV BOLUS
INTRAVENOUS | Status: DC | PRN
  Administered 2019-06-08: 20 mg via INTRAVENOUS

## 2019-06-08 MED ORDER — ACETAMINOPHEN 10 MG/ML IV SOLN
1000.0000 mg | Freq: Four times a day (QID) | INTRAVENOUS | Status: DC
  Administered 2019-06-08: 1000 mg via INTRAVENOUS
  Filled 2019-06-08: qty 100

## 2019-06-08 MED ORDER — MENTHOL 3 MG MT LOZG: 1.0000 | LOZENGE | OROMUCOSAL | Status: DC | PRN

## 2019-06-08 MED ORDER — MORPHINE SULFATE (PF) 2 MG/ML IV SOLN: 0.5000 mg | INTRAVENOUS | Status: DC | PRN

## 2019-06-08 MED ORDER — DOCUSATE SODIUM 100 MG PO CAPS
100.0000 mg | ORAL_CAPSULE | Freq: Two times a day (BID) | ORAL | Status: DC
  Administered 2019-06-08 – 2019-06-09 (×2): 100 mg via ORAL
  Filled 2019-06-08 (×2): qty 1

## 2019-06-08 MED ORDER — TRANEXAMIC ACID-NACL 1000-0.7 MG/100ML-% IV SOLN
1000.0000 mg | INTRAVENOUS | Status: AC
  Administered 2019-06-08: 1000 mg via INTRAVENOUS
  Filled 2019-06-08: qty 100

## 2019-06-08 MED ORDER — METOCLOPRAMIDE HCL 5 MG PO TABS: 5.0000 mg | ORAL_TABLET | Freq: Three times a day (TID) | ORAL | Status: DC | PRN

## 2019-06-08 MED ORDER — ONDANSETRON HCL 4 MG/2ML IJ SOLN
INTRAMUSCULAR | Status: DC | PRN
  Administered 2019-06-08: 4 mg via INTRAVENOUS

## 2019-06-08 MED ORDER — FENTANYL CITRATE (PF) 100 MCG/2ML IJ SOLN: 25.0000 ug | INTRAMUSCULAR | Status: DC | PRN

## 2019-06-08 MED ORDER — CLONIDINE HCL (ANALGESIA) 100 MCG/ML EP SOLN
EPIDURAL | Status: DC | PRN
  Administered 2019-06-08: 100 ug

## 2019-06-08 MED ORDER — ONDANSETRON HCL 4 MG/2ML IJ SOLN
INTRAMUSCULAR | Status: AC
  Filled 2019-06-08: qty 2

## 2019-06-08 MED ORDER — CEFAZOLIN SODIUM-DEXTROSE 2-4 GM/100ML-% IV SOLN
2.0000 g | Freq: Four times a day (QID) | INTRAVENOUS | Status: AC
  Administered 2019-06-08 (×2): 2 g via INTRAVENOUS
  Filled 2019-06-08 (×2): qty 100

## 2019-06-08 MED ORDER — DEXAMETHASONE SODIUM PHOSPHATE 10 MG/ML IJ SOLN
10.0000 mg | Freq: Once | INTRAMUSCULAR | Status: DC
  Filled 2019-06-08: qty 1

## 2019-06-08 MED ORDER — LEVOTHYROXINE SODIUM 75 MCG PO TABS
75.0000 ug | ORAL_TABLET | Freq: Every day | ORAL | Status: DC
  Administered 2019-06-09: 75 ug via ORAL
  Filled 2019-06-08: qty 1

## 2019-06-08 MED ORDER — FENTANYL CITRATE (PF) 100 MCG/2ML IJ SOLN
INTRAMUSCULAR | Status: DC | PRN
  Administered 2019-06-08: 100 ug via INTRAVENOUS

## 2019-06-08 MED ORDER — CHLORHEXIDINE GLUCONATE 4 % EX LIQD: 60.0000 mL | Freq: Once | CUTANEOUS | Status: DC

## 2019-06-08 MED ORDER — SODIUM CHLORIDE (PF) 0.9 % IJ SOLN
INTRAMUSCULAR | Status: AC
  Filled 2019-06-08: qty 50

## 2019-06-08 MED ORDER — 0.9 % SODIUM CHLORIDE (POUR BTL) OPTIME
TOPICAL | Status: DC | PRN
  Administered 2019-06-08: 1000 mL

## 2019-06-08 MED ORDER — TRAMADOL HCL 50 MG PO TABS
50.0000 mg | ORAL_TABLET | Freq: Four times a day (QID) | ORAL | Status: DC | PRN
  Filled 2019-06-08: qty 2

## 2019-06-08 MED ORDER — PROPOFOL 500 MG/50ML IV EMUL
INTRAVENOUS | Status: DC | PRN
  Administered 2019-06-08: 75 ug/kg/min via INTRAVENOUS

## 2019-06-08 MED ORDER — POVIDONE-IODINE 10 % EX SWAB
2.0000 "application " | Freq: Once | CUTANEOUS | Status: AC
  Administered 2019-06-08: 2 via TOPICAL

## 2019-06-08 MED ORDER — BISACODYL 10 MG RE SUPP: 10.0000 mg | Freq: Every day | RECTAL | Status: DC | PRN

## 2019-06-08 MED ORDER — OXYCODONE HCL 5 MG PO TABS
5.0000 mg | ORAL_TABLET | ORAL | Status: DC | PRN
  Administered 2019-06-08 (×3): 5 mg via ORAL
  Administered 2019-06-09: 10 mg via ORAL
  Filled 2019-06-08: qty 1
  Filled 2019-06-08: qty 2
  Filled 2019-06-08 (×2): qty 1

## 2019-06-08 MED ORDER — DIPHENHYDRAMINE HCL 12.5 MG/5ML PO ELIX: 12.5000 mg | ORAL_SOLUTION | ORAL | Status: DC | PRN

## 2019-06-08 MED ORDER — DEXAMETHASONE SODIUM PHOSPHATE 10 MG/ML IJ SOLN
INTRAMUSCULAR | Status: AC
  Filled 2019-06-08: qty 1

## 2019-06-08 MED ORDER — LIDOCAINE 2% (20 MG/ML) 5 ML SYRINGE
INTRAMUSCULAR | Status: AC
  Filled 2019-06-08: qty 5

## 2019-06-08 MED ORDER — FENTANYL CITRATE (PF) 100 MCG/2ML IJ SOLN
INTRAMUSCULAR | Status: AC
  Filled 2019-06-08: qty 2

## 2019-06-08 MED ORDER — PANTOPRAZOLE SODIUM 40 MG PO TBEC
40.0000 mg | DELAYED_RELEASE_TABLET | Freq: Every day | ORAL | Status: DC
  Administered 2019-06-09: 40 mg via ORAL
  Filled 2019-06-08: qty 1

## 2019-06-08 MED ORDER — TRANEXAMIC ACID-NACL 1000-0.7 MG/100ML-% IV SOLN
1000.0000 mg | Freq: Once | INTRAVENOUS | Status: AC
  Administered 2019-06-08: 1000 mg via INTRAVENOUS
  Filled 2019-06-08: qty 100

## 2019-06-08 MED ORDER — BUPIVACAINE-EPINEPHRINE (PF) 0.5% -1:200000 IJ SOLN
INTRAMUSCULAR | Status: DC | PRN
  Administered 2019-06-08: 20 mL via PERINEURAL

## 2019-06-08 MED ORDER — RIVAROXABAN 10 MG PO TABS
10.0000 mg | ORAL_TABLET | Freq: Every day | ORAL | Status: DC
  Administered 2019-06-09: 10 mg via ORAL
  Filled 2019-06-08: qty 1

## 2019-06-08 MED ORDER — LACTATED RINGERS IV SOLN
INTRAVENOUS | Status: DC
  Administered 2019-06-08 (×2): via INTRAVENOUS

## 2019-06-08 MED ORDER — FENTANYL CITRATE (PF) 100 MCG/2ML IJ SOLN
50.0000 ug | Freq: Once | INTRAMUSCULAR | Status: AC
  Administered 2019-06-08: 50 ug via INTRAVENOUS
  Filled 2019-06-08: qty 2

## 2019-06-08 MED ORDER — METOCLOPRAMIDE HCL 5 MG/ML IJ SOLN: 5.0000 mg | Freq: Three times a day (TID) | INTRAMUSCULAR | Status: DC | PRN

## 2019-06-08 MED ORDER — PHENOL 1.4 % MT LIQD: 1.0000 | OROMUCOSAL | Status: DC | PRN

## 2019-06-08 MED ORDER — ONDANSETRON HCL 4 MG/2ML IJ SOLN: 4.0000 mg | Freq: Four times a day (QID) | INTRAMUSCULAR | Status: DC | PRN

## 2019-06-08 MED ORDER — CEFAZOLIN SODIUM-DEXTROSE 2-4 GM/100ML-% IV SOLN
2.0000 g | INTRAVENOUS | Status: AC
  Administered 2019-06-08: 2 g via INTRAVENOUS
  Filled 2019-06-08: qty 100

## 2019-06-08 MED ORDER — DEXAMETHASONE SODIUM PHOSPHATE 10 MG/ML IJ SOLN
8.0000 mg | Freq: Once | INTRAMUSCULAR | Status: AC
  Administered 2019-06-08: 8 mg via INTRAVENOUS

## 2019-06-08 MED ORDER — MIDAZOLAM HCL 2 MG/2ML IJ SOLN
1.0000 mg | Freq: Once | INTRAMUSCULAR | Status: DC
  Filled 2019-06-08: qty 2

## 2019-06-08 MED ORDER — SODIUM CHLORIDE (PF) 0.9 % IJ SOLN
INTRAMUSCULAR | Status: DC | PRN
  Administered 2019-06-08: 60 mL

## 2019-06-08 MED ORDER — LIDOCAINE 2% (20 MG/ML) 5 ML SYRINGE
INTRAMUSCULAR | Status: DC | PRN
  Administered 2019-06-08: 50 mg via INTRAVENOUS

## 2019-06-08 MED ORDER — ONDANSETRON HCL 4 MG PO TABS: 4.0000 mg | ORAL_TABLET | Freq: Four times a day (QID) | ORAL | Status: DC | PRN

## 2019-06-08 MED ORDER — BUPIVACAINE LIPOSOME 1.3 % IJ SUSP
INTRAMUSCULAR | Status: DC | PRN
  Administered 2019-06-08: 20 mL

## 2019-06-08 MED ORDER — PRAMIPEXOLE DIHYDROCHLORIDE 0.25 MG PO TABS
0.5000 mg | ORAL_TABLET | Freq: Every day | ORAL | Status: DC
  Administered 2019-06-08: 0.5 mg via ORAL
  Filled 2019-06-08: qty 2

## 2019-06-08 MED ORDER — FLEET ENEMA 7-19 GM/118ML RE ENEM: 1.0000 | ENEMA | Freq: Once | RECTAL | Status: DC | PRN

## 2019-06-08 MED ORDER — METHOCARBAMOL 500 MG PO TABS
500.0000 mg | ORAL_TABLET | Freq: Four times a day (QID) | ORAL | Status: DC | PRN
  Administered 2019-06-08 (×2): 500 mg via ORAL
  Filled 2019-06-08 (×2): qty 1

## 2019-06-08 MED ORDER — POLYETHYLENE GLYCOL 3350 17 G PO PACK: 17.0000 g | PACK | Freq: Every day | ORAL | Status: DC | PRN

## 2019-06-08 MED ORDER — SODIUM CHLORIDE 0.9 % IV SOLN
INTRAVENOUS | Status: DC
  Administered 2019-06-08 (×2): 1000 mL via INTRAVENOUS
  Administered 2019-06-09: 04:00:00 via INTRAVENOUS

## 2019-06-08 MED ORDER — GABAPENTIN 100 MG PO CAPS
100.0000 mg | ORAL_CAPSULE | Freq: Three times a day (TID) | ORAL | Status: DC
  Administered 2019-06-08 – 2019-06-09 (×3): 100 mg via ORAL
  Filled 2019-06-08 (×3): qty 1

## 2019-06-08 MED ORDER — METHOCARBAMOL 500 MG IVPB - SIMPLE MED
500.0000 mg | Freq: Four times a day (QID) | INTRAVENOUS | Status: DC | PRN
  Filled 2019-06-08: qty 50

## 2019-06-08 MED ORDER — SODIUM CHLORIDE (PF) 0.9 % IJ SOLN
INTRAMUSCULAR | Status: AC
  Filled 2019-06-08: qty 10

## 2019-06-08 MED ORDER — PROPOFOL 10 MG/ML IV BOLUS
INTRAVENOUS | Status: AC
  Filled 2019-06-08: qty 40

## 2019-06-08 MED ORDER — SODIUM CHLORIDE 0.9 % IR SOLN
Status: DC | PRN
  Administered 2019-06-08: 1000 mL

## 2019-06-08 MED ORDER — ACETAMINOPHEN 500 MG PO TABS: 1000.0000 mg | ORAL_TABLET | Freq: Once | ORAL | Status: DC

## 2019-06-08 MED ORDER — ACETAMINOPHEN 500 MG PO TABS
1000.0000 mg | ORAL_TABLET | Freq: Four times a day (QID) | ORAL | Status: AC
  Administered 2019-06-08 – 2019-06-09 (×4): 1000 mg via ORAL
  Filled 2019-06-08 (×5): qty 2

## 2019-06-08 SURGICAL SUPPLY — 59 items
BAG ZIPLOCK 12X15 (MISCELLANEOUS) ×2 IMPLANT
BLADE SAG 18X100X1.27 (BLADE) ×2 IMPLANT
BLADE SAW SGTL 11.0X1.19X90.0M (BLADE) ×2 IMPLANT
BLADE SURG SZ10 CARB STEEL (BLADE) ×4 IMPLANT
BNDG ELASTIC 6X5.8 VLCR STR LF (GAUZE/BANDAGES/DRESSINGS) ×2 IMPLANT
BOWL SMART MIX CTS (DISPOSABLE) ×2 IMPLANT
CEMENT HV SMART SET (Cement) ×4 IMPLANT
CEMENT TIBIA MBT (Knees) IMPLANT
CLSR STERI-STRIP ANTIMIC 1/2X4 (GAUZE/BANDAGES/DRESSINGS) ×2 IMPLANT
COVER SURGICAL LIGHT HANDLE (MISCELLANEOUS) ×2 IMPLANT
COVER WAND RF STERILE (DRAPES) IMPLANT
CUFF TOURN SGL QUICK 34 (TOURNIQUET CUFF) ×1
CUFF TRNQT CYL 34X4.125X (TOURNIQUET CUFF) ×1 IMPLANT
DECANTER SPIKE VIAL GLASS SM (MISCELLANEOUS) ×3 IMPLANT
DRAPE U-SHAPE 47X51 STRL (DRAPES) ×2 IMPLANT
DRSG ADAPTIC 3X8 NADH LF (GAUZE/BANDAGES/DRESSINGS) ×2 IMPLANT
DRSG PAD ABDOMINAL 8X10 ST (GAUZE/BANDAGES/DRESSINGS) ×2 IMPLANT
DURAPREP 26ML APPLICATOR (WOUND CARE) ×2 IMPLANT
ELECT REM PT RETURN 15FT ADLT (MISCELLANEOUS) ×2 IMPLANT
EVACUATOR 1/8 PVC DRAIN (DRAIN) ×2 IMPLANT
FEMUR SIGMA PS KNEE SZ 4.0N L (Femur) ×1 IMPLANT
GAUZE SPONGE 4X4 12PLY STRL (GAUZE/BANDAGES/DRESSINGS) ×2 IMPLANT
GLOVE BIO SURGEON STRL SZ7 (GLOVE) ×1 IMPLANT
GLOVE BIO SURGEON STRL SZ8 (GLOVE) ×1 IMPLANT
GLOVE BIOGEL PI IND STRL 6.5 (GLOVE) ×1 IMPLANT
GLOVE BIOGEL PI IND STRL 7.0 (GLOVE) ×1 IMPLANT
GLOVE BIOGEL PI IND STRL 8 (GLOVE) ×1 IMPLANT
GLOVE BIOGEL PI INDICATOR 6.5 (GLOVE) ×1
GLOVE BIOGEL PI INDICATOR 7.0 (GLOVE)
GLOVE BIOGEL PI INDICATOR 8 (GLOVE) ×1
GLOVE SURG SS PI 6.5 STRL IVOR (GLOVE) ×2 IMPLANT
GLOVE SURG SS PI 8.0 STRL IVOR (GLOVE) ×1 IMPLANT
GOWN STRL REUS W/TWL LRG LVL3 (GOWN DISPOSABLE) ×6 IMPLANT
HANDPIECE INTERPULSE COAX TIP (DISPOSABLE) ×1
HOLDER FOLEY CATH W/STRAP (MISCELLANEOUS) ×1 IMPLANT
IMMOBILIZER KNEE 20 (SOFTGOODS) ×2
IMMOBILIZER KNEE 20 THIGH 36 (SOFTGOODS) ×1 IMPLANT
KIT TURNOVER KIT A (KITS) ×1 IMPLANT
MANIFOLD NEPTUNE II (INSTRUMENTS) ×2 IMPLANT
NS IRRIG 1000ML POUR BTL (IV SOLUTION) ×1 IMPLANT
PACK TOTAL KNEE CUSTOM (KITS) ×2 IMPLANT
PADDING CAST COTTON 6X4 STRL (CAST SUPPLIES) ×5 IMPLANT
PATELLA DOME PFC 38MM (Knees) ×1 IMPLANT
PIN STEINMAN FIXATION KNEE (PIN) ×1 IMPLANT
PLATE ROT INSERT 10MM SIZE 4 (Plate) ×1 IMPLANT
PROTECTOR NERVE ULNAR (MISCELLANEOUS) ×2 IMPLANT
SET HNDPC FAN SPRY TIP SCT (DISPOSABLE) ×1 IMPLANT
STRIP CLOSURE SKIN 1/2X4 (GAUZE/BANDAGES/DRESSINGS) ×2 IMPLANT
SUT MNCRL AB 4-0 PS2 18 (SUTURE) ×2 IMPLANT
SUT STRATAFIX 0 PDS 27 VIOLET (SUTURE) ×2
SUT VIC AB 2-0 CT1 27 (SUTURE) ×3
SUT VIC AB 2-0 CT1 TAPERPNT 27 (SUTURE) ×3 IMPLANT
SUTURE STRATFX 0 PDS 27 VIOLET (SUTURE) ×1 IMPLANT
TIBIA MBT CEMENT (Knees) ×2 IMPLANT
TRAY FOLEY MTR SLVR 14FR STAT (SET/KITS/TRAYS/PACK) ×1 IMPLANT
TRAY FOLEY MTR SLVR 16FR STAT (SET/KITS/TRAYS/PACK) ×1 IMPLANT
WATER STERILE IRR 1000ML POUR (IV SOLUTION) ×4 IMPLANT
WRAP KNEE MAXI GEL POST OP (GAUZE/BANDAGES/DRESSINGS) ×2 IMPLANT
YANKAUER SUCT BULB TIP 10FT TU (MISCELLANEOUS) ×2 IMPLANT

## 2019-06-08 NOTE — Progress Notes (Deleted)
Dr. Lanetta Inch notified that pt cbg was 397 on arrival to short stay.

## 2019-06-08 NOTE — Anesthesia Procedure Notes (Signed)
Anesthesia Regional Block: Adductor canal block   Pre-Anesthetic Checklist: ,, timeout performed, Correct Patient, Correct Site, Correct Laterality, Correct Procedure, Correct Position, site marked, Risks and benefits discussed,  Surgical consent,  Pre-op evaluation,  At surgeon's request and post-op pain management  Laterality: Left  Prep: Maximum Sterile Barrier Precautions used, chloraprep       Needles:  Injection technique: Single-shot  Needle Type: Echogenic Stimulator Needle     Needle Length: 9cm  Needle Gauge: 22     Additional Needles:   Procedures:,,,, ultrasound used (permanent image in chart),,,,  Narrative:  Start time: 06/08/2019 8:19 AM End time: 06/08/2019 8:29 AM Injection made incrementally with aspirations every 5 mL.  Performed by: Personally  Anesthesiologist: Freddrick March, MD  Additional Notes: Monitors applied. No increased pain on injection. No increased resistance to injection. Injection made in 5cc increments. Good needle visualization. Patient tolerated procedure well.

## 2019-06-08 NOTE — Anesthesia Preprocedure Evaluation (Addendum)
Anesthesia Evaluation  Patient identified by MRN, date of birth, ID band Patient awake    Reviewed: Allergy & Precautions, NPO status , Patient's Chart, lab work & pertinent test results  Airway Mallampati: II  TM Distance: >3 FB Neck ROM: Full    Dental no notable dental hx. (+) Teeth Intact, Dental Advisory Given   Pulmonary sleep apnea and Continuous Positive Airway Pressure Ventilation ,    Pulmonary exam normal breath sounds clear to auscultation       Cardiovascular negative cardio ROS Normal cardiovascular exam Rhythm:Regular Rate:Normal     Neuro/Psych negative neurological ROS  negative psych ROS   GI/Hepatic Neg liver ROS, GERD  Medicated,  Endo/Other  diabetesHypothyroidism   Renal/GU negative Renal ROS  negative genitourinary   Musculoskeletal  (+) Arthritis ,   Abdominal   Peds  Hematology negative hematology ROS (+)   Anesthesia Other Findings   Reproductive/Obstetrics                           Anesthesia Physical Anesthesia Plan  ASA: III  Anesthesia Plan: Spinal and Regional   Post-op Pain Management:  Regional for Post-op pain   Induction:   PONV Risk Score and Plan: Treatment may vary due to age or medical condition  Airway Management Planned: Natural Airway  Additional Equipment:   Intra-op Plan:   Post-operative Plan:   Informed Consent: I have reviewed the patients History and Physical, chart, labs and discussed the procedure including the risks, benefits and alternatives for the proposed anesthesia with the patient or authorized representative who has indicated his/her understanding and acceptance.     Dental advisory given  Plan Discussed with: CRNA  Anesthesia Plan Comments:         Anesthesia Quick Evaluation

## 2019-06-08 NOTE — Anesthesia Procedure Notes (Signed)
Spinal  Patient location during procedure: OR End time: 06/08/2019 9:24 AM Staffing Resident/CRNA: Noralyn Pick D, CRNA Performed: anesthesiologist and resident/CRNA  Preanesthetic Checklist Completed: patient identified, site marked, surgical consent, pre-op evaluation, timeout performed, IV checked, risks and benefits discussed and monitors and equipment checked Spinal Block Patient position: sitting Prep: Betadine Patient monitoring: heart rate, continuous pulse ox and blood pressure Approach: midline Location: L3-4 Injection technique: single-shot Needle Needle type: Sprotte  Needle gauge: 24 G Needle length: 9 cm Assessment Sensory level: T6 Additional Notes Expiration date of kit checked and confirmed. Patient tolerated procedure well, without complications.

## 2019-06-08 NOTE — Anesthesia Postprocedure Evaluation (Signed)
Anesthesia Post Note  Patient: Cristina Santos  Procedure(s) Performed: TOTAL KNEE ARTHROPLASTY (Left Knee)     Patient location during evaluation: PACU Anesthesia Type: Regional and Spinal Level of consciousness: oriented and awake and alert Pain management: pain level controlled Vital Signs Assessment: post-procedure vital signs reviewed and stable Respiratory status: spontaneous breathing, respiratory function stable and patient connected to nasal cannula oxygen Cardiovascular status: blood pressure returned to baseline and stable Postop Assessment: no headache, no backache and no apparent nausea or vomiting Anesthetic complications: no    Last Vitals:  Vitals:   06/08/19 1145 06/08/19 1200  BP: 122/60 128/63  Pulse: (!) 59 (!) 57  Resp: 17 12  Temp: (!) 36.3 C   SpO2: 97% 100%    Last Pain:  Vitals:   06/08/19 1115  TempSrc:   PainSc: 0-No pain                 Cristina Santos

## 2019-06-08 NOTE — Transfer of Care (Signed)
Immediate Anesthesia Transfer of Care Note  Patient: Cristina Santos  Procedure(s) Performed: TOTAL KNEE ARTHROPLASTY (Left Knee)  Patient Location: PACU  Anesthesia Type:Spinal  Level of Consciousness: awake, alert  and oriented  Airway & Oxygen Therapy: Patient Spontanous Breathing and Patient connected to nasal cannula oxygen  Post-op Assessment: Report given to RN and Post -op Vital signs reviewed and stable  Post vital signs: Reviewed and stable  Last Vitals:  Vitals Value Taken Time  BP    Temp    Pulse    Resp    SpO2      Last Pain:  Vitals:   06/08/19 0742  TempSrc: Oral  PainSc:       Patients Stated Pain Goal: 4 (25/67/20 9198)  Complications: No apparent anesthesia complications

## 2019-06-08 NOTE — Evaluation (Signed)
Physical Therapy Evaluation Patient Details Name: Cristina Santos MRN: [id number] DOB: 02-12-1941 Today's Date: 06/08/2019   History of Present Illness  s/p L TKA; PMH: R glut tendon repair  Clinical Impression  Pt is s/p TKA resulting in the deficits listed below (see PT Problem List).  Pt amb 11' with RW and min/mod assist, limited by knee buckling with KI in place.  Anticipate steady progress. Continue PT in acute setting  Pt will benefit from skilled PT to increase their independence and safety with mobility to allow discharge to the venue listed below.      Follow Up Recommendations Follow surgeon's recommendation for DC plan and follow-up therapies    Equipment Recommendations  None recommended by PT    Recommendations for Other Services       Precautions / Restrictions Precautions Precautions: Fall;Knee Precaution Booklet Issued: No Required Braces or Orthoses: Knee Immobilizer - Right Restrictions Weight Bearing Restrictions: No LLE Weight Bearing: Weight bearing as tolerated      Mobility  Bed Mobility Overal bed mobility: Needs Assistance Bed Mobility: Supine to Sit     Supine to sit: Min assist     General bed mobility comments: assist with LLE  Transfers Overall transfer level: Needs assistance Equipment used: Rolling walker (2 wheeled) Transfers: Sit to/from Stand Sit to Stand: Min assist         General transfer comment: cues for hand placement and LLE positioning  Ambulation/Gait Ambulation/Gait assistance: Min assist Gait Distance (Feet): 11 Feet Assistive device: Rolling walker (2 wheeled) Gait Pattern/deviations: Step-to pattern;Decreased weight shift to left     General Gait Details: cues for sequence, L knee buckling inside KI despite functional motor control at bed level, requiring assist when wt shifting  Stairs            Wheelchair Mobility    Modified Rankin (Stroke Patients Only)       Balance                                              Pertinent Vitals/Pain Pain Assessment: 0-10 Pain Score: 5  Pain Location: left knee Pain Descriptors / Indicators: Aching;Sore Pain Intervention(s): Monitored during session;Limited activity within patient's tolerance    Home Living Family/patient expects to be discharged to:: Private residence Living Arrangements: Alone Available Help at Discharge: Available PRN/intermittently Type of Home: House Home Access: Stairs to enter   [employer] of Steps: 1 Home Layout: One level Home Equipment: [place] - 4 wheels;Bedside commode;Walker - 2 wheels;Shower seat;Grab bars - tub/shower;Grab bars - toilet      Prior Function Level of Independence: Independent               Hand Dominance        Extremity/Trunk Assessment   Upper Extremity Assessment Upper Extremity Assessment: Overall WFL for tasks assessed    Lower Extremity Assessment Lower Extremity Assessment: LLE deficits/detail LLE Deficits / Details: ankle WFL; knee extension with hip flexion 2+/5       Communication   Communication: No difficulties  Cognition Arousal/Alertness: Awake/alert Behavior During Therapy: WFL for tasks assessed/performed Overall Cognitive Status: Within Functional Limits for tasks assessed  General Comments      Exercises Total Joint Exercises Ankle Circles/Pumps: AROM;10 reps;Both Quad Sets: AROM;5 reps;Both   Assessment/Plan    PT Assessment Patient needs continued PT services  PT Problem List Decreased strength;Decreased range of motion;Decreased activity tolerance;Decreased mobility;Pain;Decreased knowledge of use of DME       PT Treatment Interventions DME instruction;Gait training;Functional mobility training;Therapeutic activities;Patient/family education;Therapeutic exercise    PT Goals (Current goals can be found in the Care Plan section)  Acute Rehab PT  Goals PT Goal Formulation: With patient Time For Goal Achievement: 06/15/19 Potential to Achieve Goals: Good    Frequency 7X/week   Barriers to discharge        Co-evaluation               AM-PAC PT "6 Clicks" Mobility  Outcome Measure Help needed turning from your back to your side while in a flat bed without using bedrails?: A Little Help needed moving from lying on your back to sitting on the side of a flat bed without using bedrails?: A Little Help needed moving to and from a bed to a chair (including a wheelchair)?: A Little Help needed standing up from a chair using your arms (e.g., wheelchair or bedside chair)?: A Little Help needed to walk in hospital room?: A Lot Help needed climbing 3-5 steps with a railing? : Total 6 Click Score: 15    End of Session Equipment Utilized During Treatment: Gait belt;Left knee immobilizer Activity Tolerance: Patient tolerated treatment well;Other (comment)(limited by dizziness) Patient left: in chair;with call bell/phone within reach;with chair alarm set   PT Visit Diagnosis: Difficulty in walking, not elsewhere classified (R26.2)    Time: 0165-5374 PT Time Calculation (min) (ACUTE ONLY): 18 min   Charges:   PT Evaluation $PT Eval Low Complexity: 1 Low          Kenyon Ana, PT  Pager: [phone] Acute Rehab Dept [facility]): [phone]   06/08/2019   [facility] 06/08/2019, 5:33 PM

## 2019-06-08 NOTE — Progress Notes (Signed)
Assisted Dr. Lanetta Inch with left, ultrasound guided, adductor canal block. Side rails up, monitors on throughout procedure. See vital signs in flow sheet. Tolerated Procedure well.

## 2019-06-08 NOTE — Interval H&P Note (Signed)
History and Physical Interval Note:  06/08/2019 7:20 AM  Cristina Santos  has presented today for surgery, with the diagnosis of left knee osteoarthritis.  The various methods of treatment have been discussed with the patient and family. After consideration of risks, benefits and other options for treatment, the patient has consented to  Procedure(s) with comments: TOTAL KNEE ARTHROPLASTY (Left) - 40min as a surgical intervention.  The patient's history has been reviewed, patient examined, no change in status, stable for surgery.  I have reviewed the patient's chart and labs.  Questions were answered to the patient's satisfaction.     Pilar Plate Tamecka Milham

## 2019-06-08 NOTE — Op Note (Signed)
OPERATIVE REPORT-TOTAL KNEE ARTHROPLASTY   Pre-operative diagnosis- Osteoarthritis  Left knee(s)  Post-operative diagnosis- Osteoarthritis Left knee(s)  Procedure-  Left  Total Knee Arthroplasty  Surgeon- Dione Plover. Ligia Duguay, MD  Assistant- Ardeen Jourdain, PA-C   Anesthesia-  Adductor canal block and spinal  EBL- 25 ml   Drains Hemovac  Tourniquet time-  Total Tourniquet Time Documented: Thigh (Left) - 41 minutes Total: Thigh (Left) - 41 minutes     Complications- None  Condition-PACU - hemodynamically stable.   Brief Clinical Note  Cristina Santos is a 78 y.o. year old female with end stage OA of her left knee with progressively worsening pain and dysfunction. She has constant pain, with activity and at rest and significant functional deficits with difficulties even with ADLs. She has had extensive non-op management including analgesics, injections of cortisone and viscosupplements, and home exercise program, but remains in significant pain with significant dysfunction. Radiographs show bone on bone arthritis medial and patellofemoral. She presents now for left Total Knee Arthroplasty.    Procedure in detail---   The patient is brought into the operating room and positioned supine on the operating table. After successful administration of  Adductor canal block and spinal,   a tourniquet is placed high on the  Left thigh(s) and the lower extremity is prepped and draped in the usual sterile fashion. Time out is performed by the operating team and then the  Left lower extremity is wrapped in Esmarch, knee flexed and the tourniquet inflated to 300 mmHg.       A midline incision is made with a ten blade through the subcutaneous tissue to the level of the extensor mechanism. A fresh blade is used to make a medial parapatellar arthrotomy. Soft tissue over the proximal medial tibia is subperiosteally elevated to the joint line with a knife and into the semimembranosus bursa with a Cobb  elevator. Soft tissue over the proximal lateral tibia is elevated with attention being paid to avoiding the patellar tendon on the tibial tubercle. The patella is everted, knee flexed 90 degrees and the ACL and PCL are removed. Findings are bone on bone medial and patellofemoral with large global osteophytes.        The drill is used to create a starting hole in the distal femur and the canal is thoroughly irrigated with sterile saline to remove the fatty contents. The 5 degree Left  valgus alignment guide is placed into the femoral canal and the distal femoral cutting block is pinned to remove 10 mm off the distal femur. Resection is made with an oscillating saw.      The tibia is subluxed forward and the menisci are removed. The extramedullary alignment guide is placed referencing proximally at the medial aspect of the tibial tubercle and distally along the second metatarsal axis and tibial crest. The block is pinned to remove 72mm off the more deficient medial  side. Resection is made with an oscillating saw. Size 3is the most appropriate size for the tibia and the proximal tibia is prepared with the modular drill and keel punch for that size.      The femoral sizing guide is placed and size 4 is most appropriate. Rotation is marked off the epicondylar axis and confirmed by creating a rectangular flexion gap at 90 degrees. The size 4 cutting block is pinned in this rotation and the anterior, posterior and chamfer cuts are made with the oscillating saw. The intercondylar block is then placed and that cut is made.  Trial size 3 tibial component, trial size 4 narrow posterior stabilized femur and a 10  mm posterior stabilized rotating platform insert trial is placed. Full extension is achieved with excellent varus/valgus and anterior/posterior balance throughout full range of motion. The patella is everted and thickness measured to be 22  mm. Free hand resection is taken to 12 mm, a 38 template is placed, lug  holes are drilled, trial patella is placed, and it tracks normally. Osteophytes are removed off the posterior femur with the trial in place. All trials are removed and the cut bone surfaces prepared with pulsatile lavage. Cement is mixed and once ready for implantation, the size 3 tibial implant, size  4 narrow posterior stabilized femoral component, and the size 38 patella are cemented in place and the patella is held with the clamp. The trial insert is placed and the knee held in full extension. The Exparel (20 ml mixed with 60 ml saline) is injected into the extensor mechanism, posterior capsule, medial and lateral gutters and subcutaneous tissues.  All extruded cement is removed and once the cement is hard the permanent 10 mm posterior stabilized rotating platform insert is placed into the tibial tray.      The wound is copiously irrigated with saline solution and the extensor mechanism closed over a hemovac drain with #1 V-loc suture. The tourniquet is released for a total tourniquet time of 41  minutes. Flexion against gravity is 130 degrees and the patella tracks normally. Subcutaneous tissue is closed with 2.0 vicryl and subcuticular with running 4.0 Monocryl. The incision is cleaned and dried and steri-strips and a bulky sterile dressing are applied. The limb is placed into a knee immobilizer and the patient is awakened and transported to recovery in stable condition.      Please note that a surgical assistant was a medical necessity for this procedure in order to perform it in a safe and expeditious manner. Surgical assistant was necessary to retract the ligaments and vital neurovascular structures to prevent injury to them and also necessary for proper positioning of the limb to allow for anatomic placement of the prosthesis.   Dione Plover Yuriko Portales, MD    06/08/2019, 10:35 AM

## 2019-06-08 NOTE — Discharge Instructions (Addendum)
° °Dr. Frank Aluisio °Total Joint Specialist °[facility] °[address] °[place], [place] [address] °[phone] ° °TOTAL KNEE REPLACEMENT POSTOPERATIVE DIRECTIONS ° °Knee Rehabilitation, Guidelines Following Surgery  °Results after knee surgery are often greatly improved when you follow the exercise, range of motion and muscle strengthening exercises prescribed by your doctor. Safety measures are also important to protect the knee from further injury. Any time any of these exercises cause you to have increased pain or swelling in your knee joint, decrease the amount until you are comfortable again and slowly increase them. If you have problems or questions, call your caregiver or physical therapist for advice.  ° °HOME CARE INSTRUCTIONS  °• Remove items at home which could result in a fall. This includes throw rugs or furniture in walking pathways.  °· ICE to the affected knee every three hours for 30 minutes at a time and then as needed for pain and swelling.  Continue to use ice on the knee for pain and swelling from surgery. You may notice swelling that will progress down to the foot and ankle.  This is normal after surgery.  Elevate the leg when you are not up walking on it.   °· Continue to use the breathing machine which will help keep your temperature down.  It is common for your temperature to cycle up and down following surgery, especially at night when you are not up moving around and exerting yourself.  The breathing machine keeps your lungs expanded and your temperature down. °· Do not place pillow under knee, focus on keeping the knee straight while resting ° °DIET °You may resume your previous home diet once your are discharged from the hospital. ° °DRESSING / WOUND CARE / SHOWERING °You may change your dressing 3-5 days after surgery.  Then change the dressing every day with sterile gauze.  Please use good hand washing techniques before changing the dressing.  Do not use any lotions  or creams on the incision until instructed by your surgeon. °You may start showering once you are discharged home but do not submerge the incision under water. Just pat the incision dry and apply a dry gauze dressing on daily. °Change the surgical dressing daily and reapply a dry dressing each time. ° °ACTIVITY °Walk with your walker as instructed. °Use walker as long as suggested by your caregivers. °Avoid periods of inactivity such as sitting longer than an hour when not asleep. This helps prevent blood clots.  °You may resume a sexual relationship in one month or when given the OK by your doctor.  °You may return to work once you are cleared by your doctor.  °Do not drive a car for 6 weeks or until released by you surgeon.  °Do not drive while taking narcotics. ° °WEIGHT BEARING °Weight bearing as tolerated with assist device (walker, cane, etc) as directed, use it as long as suggested by your surgeon or therapist, typically at least 4-6 weeks. ° °POSTOPERATIVE CONSTIPATION PROTOCOL °Constipation - defined medically as fewer than three stools per week and severe constipation as less than one stool per week. ° °One of the most common issues patients have following surgery is constipation.  Even if you have a regular bowel pattern at home, your normal regimen is likely to be disrupted due to multiple reasons following surgery.  Combination of anesthesia, postoperative narcotics, change in appetite and fluid intake all can affect your bowels.  In order to avoid complications following surgery, here are some   recommendations in order to help you during your recovery period. ° °Colace (docusate) - Pick up an over-the-counter form of Colace or another stool softener and take twice a day as long as you are requiring postoperative pain medications.  Take with a full glass of water daily.  If you experience loose stools or diarrhea, hold the colace until you stool forms back up.  If your symptoms do not get better within 1  week or if they get worse, check with your doctor. ° °Dulcolax (bisacodyl) - Pick up over-the-counter and take as directed by the product packaging as needed to assist with the movement of your bowels.  Take with a full glass of water.  Use this product as needed if not relieved by Colace only.  ° °MiraLax (polyethylene glycol) - Pick up over-the-counter to have on hand.  MiraLax is a solution that will increase the amount of water in your bowels to assist with bowel movements.  Take as directed and can mix with a glass of water, juice, soda, coffee, or tea.  Take if you go more than two days without a movement. °Do not use MiraLax more than once per day. Call your doctor if you are still constipated or irregular after using this medication for 7 days in a row. ° °If you continue to have problems with postoperative constipation, please contact the office for further assistance and recommendations.  If you experience "the worst abdominal pain ever" or develop nausea or vomiting, please contact the office immediatly for further recommendations for treatment. ° °ITCHING °If you experience itching with your medications, try taking only a single pain pill, or even half a pain pill at a time.  You can also use Benadryl over the counter for itching or also to help with sleep.  ° °TED HOSE STOCKINGS °Wear the elastic stockings on both legs for three weeks following surgery during the day but you may remove then at night for sleeping. ° °MEDICATIONS °See your medication summary on the “After Visit Summary” that the nursing staff will review with you prior to discharge.  You may have some home medications which will be placed on hold until you complete the course of blood thinner medication.  It is important for you to complete the blood thinner medication as prescribed by your surgeon.  Continue your approved medications as instructed at time of discharge. ° °PRECAUTIONS °If you experience chest pain or shortness of breath -  call 911 immediately for transfer to the hospital emergency department.  °If you develop a fever greater that 101 F, purulent drainage from wound, increased redness or drainage from wound, foul odor from the wound/dressing, or calf pain - CONTACT YOUR SURGEON.   °                                                °FOLLOW-UP APPOINTMENTS °Make sure you keep all of your appointments after your operation with your surgeon and caregivers. You should call the office at the above phone number and make an appointment for approximately two weeks after the date of your surgery or on the date instructed by your surgeon outlined in the "After Visit Summary". ° °RANGE OF MOTION AND STRENGTHENING EXERCISES  °Rehabilitation of the knee is important following a knee injury or an operation. After just a few days of immobilization, the muscles of   the thigh which control the knee become weakened and shrink (atrophy). Knee exercises are designed to build up the tone and strength of the thigh muscles and to improve knee motion. Often times heat used for twenty to thirty minutes before working out will loosen up your tissues and help with improving the range of motion but do not use heat for the first two weeks following surgery. These exercises can be done on a training (exercise) mat, on the floor, on a table or on a bed. Use what ever works the best and is most comfortable for you Knee exercises include:   Leg Lifts - While your knee is still immobilized in a splint or cast, you can do straight leg raises. Lift the leg to 60 degrees, hold for 3 sec, and slowly lower the leg. Repeat 10-20 times 2-3 times daily. Perform this exercise against resistance later as your knee gets better.   Quad and Hamstring Sets - Tighten up the muscle on the front of the thigh (Quad) and hold for 5-10 sec. Repeat this 10-20 times hourly. Hamstring sets are done by pushing the foot backward against an object and holding for 5-10 sec. Repeat as with quad  sets.   Leg Slides: Lying on your back, slowly slide your foot toward your buttocks, bending your knee up off the floor (only go as far as is comfortable). Then slowly slide your foot back down until your leg is flat on the floor again.  Angel Wings: Lying on your back spread your legs to the side as far apart as you can without causing discomfort.  A rehabilitation program following serious knee injuries can speed recovery and prevent re-injury in the future due to weakened muscles. Contact your doctor or a physical therapist for more information on knee rehabilitation.   IF YOU ARE TRANSFERRED TO A SKILLED REHAB FACILITY If the patient is transferred to a skilled rehab facility following release from the hospital, a list of the current medications will be sent to the facility for the patient to continue.  When discharged from the skilled rehab facility, please have the facility set up the patient's [place] prior to being released. Also, the skilled facility will be responsible for providing the patient with their medications at time of release from the facility to include their pain medication, the muscle relaxants, and their blood thinner medication. If the patient is still at the rehab facility at time of the two week follow up appointment, the skilled rehab facility will also need to assist the patient in arranging follow up appointment in our office and any transportation needs.  MAKE SURE YOU:   Understand these instructions.   Get help right away if you are not doing well or get worse.    Pick up stool softner and laxative for home use following surgery while on pain medications. Do not submerge incision under water. Please use good hand washing techniques while changing dressing each day. May shower starting three days after surgery. Please use a clean towel to pat the incision dry following showers. Continue to use ice for pain and swelling after surgery. Do not  use any lotions or creams on the incision until instructed by your surgeon.   Information on my medicine - XARELTO (Rivaroxaban)  This medication education was reviewed with me or my healthcare representative as part of my discharge preparation.  The pharmacist that spoke with me during my hospital stay was:    Why was Xarelto  prescribed for you? Xarelto was prescribed for you to reduce the risk of blood clots forming after orthopedic surgery. The medical term for these abnormal blood clots is venous thromboembolism (VTE).  What do you need to know about xarelto ? Take your Xarelto ONCE DAILY at the same time every day. You may take it either with or without food.  If you have difficulty swallowing the tablet whole, you may crush it and mix in applesauce just prior to taking your dose.  Take Xarelto exactly as prescribed by your doctor and DO NOT stop taking Xarelto without talking to the doctor who prescribed the medication.  Stopping without other VTE prevention medication to take the place of Xarelto may increase your risk of developing a clot.  After discharge, you should have regular check-up appointments with your healthcare provider that is prescribing your Xarelto.    What do you do if you miss a dose? If you miss a dose, take it as soon as you remember on the same day then continue your regularly scheduled once daily regimen the next day. Do not take two doses of Xarelto on the same day.   Important Safety Information A possible side effect of Xarelto is bleeding. You should call your healthcare provider right away if you experience any of the following: ? Bleeding from an injury or your nose that does not stop. ? Unusual colored urine (red or dark brown) or unusual colored stools (red or black). ? Unusual bruising for unknown reasons. ? A serious fall or if you hit your head (even if there is no bleeding).  Some medicines may interact with Xarelto and might  increase your risk of bleeding while on Xarelto. To help avoid this, consult your healthcare provider or pharmacist prior to using any new prescription or non-prescription medications, including herbals, vitamins, non-steroidal anti-inflammatory drugs (NSAIDs) and supplements.  This website has more information on Xarelto: [email or web address]

## 2019-06-09 ENCOUNTER — Encounter (HOSPITAL_COMMUNITY): Payer: Self-pay | Admitting: Orthopedic Surgery

## 2019-06-09 DIAGNOSIS — M1712 Unilateral primary osteoarthritis, left knee: Secondary | ICD-10-CM | POA: Diagnosis not present

## 2019-06-09 LAB — CBC
HCT: 32.4 % — ABNORMAL LOW (ref 36.0–46.0)
Hemoglobin: 9.7 g/dL — ABNORMAL LOW (ref 12.0–15.0)
MCH: 25.6 pg — ABNORMAL LOW (ref 26.0–34.0)
MCHC: 29.9 g/dL — ABNORMAL LOW (ref 30.0–36.0)
MCV: 85.5 fL (ref 80.0–100.0)
Platelets: 104 10*3/uL — ABNORMAL LOW (ref 150–400)
RBC: 3.79 MIL/uL — ABNORMAL LOW (ref 3.87–5.11)
RDW: 14.2 % (ref 11.5–15.5)
WBC: 4.7 10*3/uL (ref 4.0–10.5)
nRBC: 0 % (ref 0.0–0.2)

## 2019-06-09 LAB — BASIC METABOLIC PANEL
Anion gap: 4 — ABNORMAL LOW (ref 5–15)
BUN: 9 mg/dL (ref 8–23)
CO2: 27 mmol/L (ref 22–32)
Calcium: 8.7 mg/dL — ABNORMAL LOW (ref 8.9–10.3)
Chloride: 105 mmol/L (ref 98–111)
Creatinine, Ser: 0.71 mg/dL (ref 0.44–1.00)
GFR calc Af Amer: >60 mL/min (ref >60)
GFR calc non Af Amer: >60 mL/min (ref >60)
Glucose, Bld: 150 mg/dL — ABNORMAL HIGH (ref 70–99)
Potassium: 4.5 mmol/L (ref 3.5–5.1)
Sodium: 136 mmol/L (ref 135–145)

## 2019-06-09 MED ORDER — RIVAROXABAN 10 MG PO TABS: 10.0000 mg | ORAL_TABLET | Freq: Every day | ORAL | 0 refills | Status: DC

## 2019-06-09 MED ORDER — GABAPENTIN 100 MG PO CAPS: 100.0000 mg | ORAL_CAPSULE | Freq: Three times a day (TID) | ORAL | 0 refills | Status: DC

## 2019-06-09 MED ORDER — TRAMADOL HCL 50 MG PO TABS: 50.0000 mg | ORAL_TABLET | Freq: Four times a day (QID) | ORAL | 0 refills | Status: DC | PRN

## 2019-06-09 MED ORDER — OXYCODONE HCL 5 MG PO TABS: 5.0000 mg | ORAL_TABLET | Freq: Four times a day (QID) | ORAL | 0 refills | Status: DC | PRN

## 2019-06-09 MED ORDER — METHOCARBAMOL 500 MG PO TABS: 500.0000 mg | ORAL_TABLET | Freq: Four times a day (QID) | ORAL | 0 refills | Status: DC | PRN

## 2019-06-09 NOTE — Progress Notes (Signed)
   06/09/19 1400  PT Visit Information  Last PT Received On 06/09/19--pt progressing well thi spm. No dizziness. Reviewed transfer safety, gait, stairs. Pt ready for d/c from PT standpoint  Assistance Needed +1  History of Present Illness s/p L TKA PMH: R glut tendon repair, DM  Precautions  Precautions Fall;Knee  Precaution Booklet Issued No  Precaution Comments independent SLRs  Restrictions  Weight Bearing Restrictions No  Other Position/Activity Restrictions WBAT  Pain Assessment  Pain Assessment 0-10  Pain Score 4  Pain Location left knee  Pain Descriptors / Indicators Aching;Sore  Pain Intervention(s) Limited activity within patient's tolerance;Monitored during session;Ice applied  Cognition  Arousal/Alertness Awake/alert  Behavior During Therapy WFL for tasks assessed/performed  Overall Cognitive Status Within Functional Limits for tasks assessed  Bed Mobility  General bed mobility comments NT--pt in recliner  Transfers  Overall transfer level Needs assistance  Equipment used Rolling walker (2 wheeled)  Transfers Sit to/from Stand  Sit to Stand Min guard  General transfer comment cues for hand placement and LLE positioning  Ambulation/Gait  Ambulation/Gait assistance Min guard;Supervision  Gait Distance (Feet) 100 Feet  Assistive device Rolling walker (2 wheeled)  Gait Pattern/deviations Step-to pattern;Decreased weight shift to left  General Gait Details cues for sequence and RW position. dizzy with amb, BP 142/66  Stairs Yes  Stairs assistance Min guard;Min assist  Stair Management No rails;Step to pattern;Backwards;With walker  Number of Stairs 1  General stair comments cues for sequence  and safe technique  Total Joint Exercises  Ankle Circles/Pumps AROM;Both;10 reps;Supine  Knee Flexion AROM;Left;5 reps;Seated  PT - End of Session  Equipment Utilized During Treatment Gait belt  Activity Tolerance Patient tolerated treatment well  Patient left with call  bell/phone within reach;in chair;with chair alarm set  Nurse Communication Mobility status   PT - Assessment/Plan  PT Plan Current plan remains appropriate  PT Visit Diagnosis Difficulty in walking, not elsewhere classified (R26.2)  PT Frequency (ACUTE ONLY) 7X/week  Follow Up Recommendations Follow surgeon's recommendation for DC plan and follow-up therapies  PT equipment None recommended by PT  AM-PAC PT "6 Clicks" Mobility Outcome Measure (Version 2)  Help needed turning from your back to your side while in a flat bed without using bedrails? 3  Help needed moving from lying on your back to sitting on the side of a flat bed without using bedrails? 3  Help needed moving to and from a bed to a chair (including a wheelchair)? 3  Help needed standing up from a chair using your arms (e.g., wheelchair or bedside chair)? 3  Help needed to walk in hospital room? 3  Help needed climbing 3-5 steps with a railing?  2  6 Click Score 17  Consider Recommendation of Discharge To: Home with [facility]  PT Goal Progression  Progress towards PT goals Progressing toward goals  Acute Rehab PT Goals  PT Goal Formulation With patient  Time For Goal Achievement 06/15/19  Potential to Achieve Goals Good  PT Time Calculation  PT Start Time (ACUTE ONLY) 1411  PT Stop Time (ACUTE ONLY) 1426  PT Time Calculation (min) (ACUTE ONLY) 15 min  PT General Charges  $$ ACUTE PT VISIT 1 Visit  PT Treatments  $Gait Training 8-22 mins

## 2019-06-09 NOTE — Plan of Care (Signed)
  Problem: Clinical Measurements: Goal: Ability to maintain clinical measurements within normal limits will improve Outcome: Progressing   Problem: Activity: Goal: Risk for activity intolerance will decrease Outcome: Progressing   Problem: Pain Managment: Goal: General experience of comfort will improve Outcome: Progressing   Problem: Education: Goal: Knowledge of the prescribed therapeutic regimen will improve Outcome: Progressing   Problem: Pain Management: Goal: Pain level will decrease with appropriate interventions Outcome: Progressing

## 2019-06-09 NOTE — Progress Notes (Signed)
   Subjective: 1 Day Post-Op Procedure(s) (LRB): TOTAL KNEE ARTHROPLASTY (Left) Patient reports pain as moderate.   Patient seen in rounds by Dr. Wynelle Link. Patient is well, and has had no acute complaints or problems other than pain in the left knee. Denies chest pain, SOB, or calf pain. Foley catheter removed this AM. No issues overnight.  We will continue therapy today.   Objective: Vital signs in last 24 hours: Temp:  [97.4 F (36.3 C)-98.1 F (36.7 C)] 97.5 F (36.4 C) (07/21 0525) Pulse Rate:  [55-67] 64 (07/21 0525) Resp:  [10-19] 16 (07/21 0525) BP: (114-151)/(54-79) 122/64 (07/21 0525) SpO2:  [97 %-100 %] 98 % (07/21 0525)  Intake/Output from previous day:  Intake/Output Summary (Last 24 hours) at 06/09/2019 0738 Last data filed at 06/09/2019 0786 Gross per 24 hour  Intake 4773.49 ml  Output 3420 ml  Net 1353.49 ml    Labs: Recent Labs    06/09/19 0323  HGB 9.7*   Recent Labs    06/09/19 0323  WBC 4.7  RBC 3.79*  HCT 32.4*  PLT 104*   Recent Labs    06/09/19 0323  NA 136  K 4.5  CL 105  CO2 27  BUN 9  CREATININE 0.71  GLUCOSE 150*  CALCIUM 8.7*   Exam: General - Patient is Alert and Oriented Extremity - Neurologically intact Neurovascular intact Sensation intact distally Dorsiflexion/Plantar flexion intact Dressing - dressing C/D/I Motor Function - intact, moving foot and toes well on exam.   Past Medical History:  Diagnosis Date  . Anemia   . Colon cancer ([place]) 1988  . Diabetes ([place])   . GERD (gastroesophageal reflux disease)   . Sleep apnea   . Thyroid disease     Assessment/Plan: 1 Day Post-Op Procedure(s) (LRB): TOTAL KNEE ARTHROPLASTY (Left) Principal Problem:   OA (osteoarthritis) of knee Active Problems:   Osteoarthritis of left knee  Estimated body mass index is 32.73 kg/m as calculated from the following:   Height as of this encounter: 5\' 6"  (1.676 m).   Weight as of this encounter: 92 kg. Advance diet Up with  therapy D/C IV fluids  Anticipated LOS equal to or greater than 2 midnights due to - Age 17 and older with one or more of the following:  - Obesity  - Expected need for hospital services (PT, OT, Nursing) required for safe  discharge  - Anticipated need for postoperative skilled nursing care or inpatient rehab  - Active co-morbidities: Diabetes OR   - Unanticipated findings during/Post Surgery: None  - Patient is a high risk of re-admission due to: None3    DVT Prophylaxis - Xarelto Weight bearing as tolerated. D/C O2 and pulse ox and try on room air. Hemovac pulled without difficulty, will continue therapy today.  Plan is to go Home after hospital stay. Possible discharge this afternoon if progresses with therapy. Scheduled for OPPT at [facility] in [place]. Follow-up in the office in 2 weeks.   Theresa Duty, PA-C Orthopedic Surgery 06/09/2019, 7:38 AM

## 2019-06-09 NOTE — Progress Notes (Signed)
Physical Therapy Progress Note  Clinical Impression: Patient seen for LE strengthening exercises. Patient tolerated therex well with cueing and demonstration. PT provided pt with HEP handout and reviewed in full with pt. PT also provided patient education re: positioning, ice and car transfers with demonstration. Pt would continue to benefit from skilled physical therapy services at this time while admitted and after d/c to address the below listed limitations in order to improve overall safety and independence with functional mobility.  Sherie Don, [place], DPT  Acute Rehabilitation Services Pager [phone] Office [phone]    06/09/19 1200  PT Visit Information  Last PT Received On 06/09/19  Assistance Needed +1  History of Present Illness Pt is a 78 y/o female s/p L TKA on 06/08/19. PMH including but not limited to DM and colon cancer.  Precautions  Precautions Fall;Knee  Precaution Booklet Issued Yes (comment)  Precaution Comments PT reviewed positioning of LE following knee surgery with pt  Restrictions  Weight Bearing Restrictions Yes  LLE Weight Bearing WBAT  Pain Assessment  Pain Assessment 0-10  Pain Score 5  Pain Location left knee  Pain Descriptors / Indicators Aching;Sore  Pain Intervention(s) Monitored during session;Repositioned  Cognition  Arousal/Alertness Awake/alert  Behavior During Therapy WFL for tasks assessed/performed  Overall Cognitive Status Within Functional Limits for tasks assessed  Exercises  Exercises Total Joint  Total Joint Exercises  Ankle Circles/Pumps AROM;Both;10 reps;Supine  Quad Sets AROM;Strengthening;Left;10 reps;Supine  Short Arc Quad AAROM;Left;10 reps;Supine  Heel Slides AAROM;Left;10 reps;Supine  Hip ABduction/ADduction AAROM;Left;10 reps;Supine  Straight Leg Raises AAROM;Left;10 reps;Supine  PT - End of Session  Activity Tolerance Patient tolerated treatment well  Patient left in bed;with call bell/phone within reach;with  SCD's reapplied  Nurse Communication Mobility status  CPM Left Knee  CPM Left Knee Off   PT - Assessment/Plan  PT Plan Current plan remains appropriate  PT Visit Diagnosis Other abnormalities of gait and mobility (R26.89);Pain  Pain - Right/Left Left  Pain - part of body Knee  PT Frequency (ACUTE ONLY) 7X/week  Follow Up Recommendations Supervision for mobility/OOB;Outpatient PT  PT equipment None recommended by PT  AM-PAC PT "6 Clicks" Mobility Outcome Measure (Version 2)  Help needed turning from your back to your side while in a flat bed without using bedrails? 3  Help needed moving from lying on your back to sitting on the side of a flat bed without using bedrails? 3  Help needed moving to and from a bed to a chair (including a wheelchair)? 3  Help needed standing up from a chair using your arms (e.g., wheelchair or bedside chair)? 3  Help needed to walk in hospital room? 3  Help needed climbing 3-5 steps with a railing?  2  6 Click Score 17  Consider Recommendation of Discharge To: Home with [facility]  PT Goal Progression  Progress towards PT goals Progressing toward goals  Acute Rehab PT Goals  PT Goal Formulation With patient  Time For Goal Achievement 06/15/19  Potential to Achieve Goals Good  PT Time Calculation  PT Start Time (ACUTE ONLY) 0949  PT Stop Time (ACUTE ONLY) 1004  PT Time Calculation (min) (ACUTE ONLY) 15 min  PT General Charges  $$ ACUTE PT VISIT 1 Visit  PT Treatments  $Therapeutic Exercise 8-22 mins

## 2019-06-09 NOTE — Care Management Obs Status (Signed)
[facility] NOTIFICATION   Patient Details  Name: YORLEY BUCH MRN: [id number] Date of Birth: 21-Oct-1941   Medicare Observation Status Notification Given:  Yes    Leeroy Cha, RN 06/09/2019, 12:03 PM

## 2019-06-09 NOTE — Progress Notes (Signed)
   06/09/19 1313  PT Visit Information  Last PT Received On 06/09/19--pt dizzy with amb. RN aware. Will see again this pm for second session to review stairs if dizziness resolved  Assistance Needed +1  History of Present Illness s/p L TKA PMH: R glut tendon repair, DM  Precautions  Precautions Fall;Knee  Precaution Booklet Issued No  Precaution Comments independent SLRs  Restrictions  Weight Bearing Restrictions No  Other Position/Activity Restrictions WBAT  Pain Assessment  Pain Assessment 0-10  Pain Score 4  Pain Location left knee  Pain Descriptors / Indicators Aching;Sore  Pain Intervention(s) Limited activity within patient's tolerance;Monitored during session;Premedicated before session  Cognition  Arousal/Alertness Awake/alert  Behavior During Therapy WFL for tasks assessed/performed  Overall Cognitive Status Within Functional Limits for tasks assessed  Bed Mobility  General bed mobility comments NT--pt in recliner  Transfers  Overall transfer level Needs assistance  Equipment used Rolling walker (2 wheeled)  Transfers Sit to/from Stand  Sit to Stand Min guard  General transfer comment cues for hand placement and LLE positioning  Ambulation/Gait  Ambulation/Gait assistance Min guard  Gait Distance (Feet) 35 Feet  Assistive device Rolling walker (2 wheeled)  Gait Pattern/deviations Step-to pattern;Decreased weight shift to left  General Gait Details cues for sequence and RW position. dizzy with amb, BP 142/66  PT - End of Session  Equipment Utilized During Treatment Gait belt  Activity Tolerance Patient tolerated treatment well;Other (comment) (limited by dizziness)  Patient left with call bell/phone within reach;in chair;with chair alarm set   PT - Assessment/Plan  PT Plan Current plan remains appropriate  PT Visit Diagnosis Difficulty in walking, not elsewhere classified (R26.2)  PT Frequency (ACUTE ONLY) 7X/week  Follow Up Recommendations Follow surgeon's  recommendation for DC plan and follow-up therapies  PT equipment None recommended by PT  AM-PAC PT "6 Clicks" Mobility Outcome Measure (Version 2)  Help needed turning from your back to your side while in a flat bed without using bedrails? 3  Help needed moving from lying on your back to sitting on the side of a flat bed without using bedrails? 3  Help needed moving to and from a bed to a chair (including a wheelchair)? 3  Help needed standing up from a chair using your arms (e.g., wheelchair or bedside chair)? 3  Help needed to walk in hospital room? 3  Help needed climbing 3-5 steps with a railing?  2  6 Click Score 17  Consider Recommendation of Discharge To: Home with [facility]  PT Goal Progression  Progress towards PT goals Progressing toward goals  Acute Rehab PT Goals  PT Goal Formulation With patient  Time For Goal Achievement 06/15/19  Potential to Achieve Goals Good  PT Time Calculation  PT Start Time (ACUTE ONLY) 1139  PT Stop Time (ACUTE ONLY) 1204  PT Time Calculation (min) (ACUTE ONLY) 25 min  PT General Charges  $$ ACUTE PT VISIT 1 Visit  PT Treatments  $Gait Training 23-37 mins  Kenyon Ana, PT  Pager: [phone] Acute Rehab Dept [facility]): [phone]   06/09/2019

## 2019-06-09 NOTE — Progress Notes (Signed)
Rn reviewed discharge instructions with patient and family (via phone).   All questions answered.   Paperwork sent with patient.   Prescriptions sent to pharmacy.   NT rolled patient down with all belongings to family car.

## 2019-06-10 NOTE — Discharge Summary (Signed)
Physician Discharge Summary   Patient ID: Cristina Santos MRN: [id number] DOB/AGE: 1941/08/07 78 y.o.  Admit date: 06/08/2019 Discharge date: 06/09/2019  Primary Diagnosis: Osteoarthritis, left knee   Admission Diagnoses:  Past Medical History:  Diagnosis Date   Anemia    Colon cancer ([place]) 1988   Diabetes ([place])    GERD (gastroesophageal reflux disease)    Sleep apnea    Thyroid disease    Discharge Diagnoses:   Principal Problem:   OA (osteoarthritis) of knee Active Problems:   Osteoarthritis of left knee  Estimated body mass index is 32.73 kg/m as calculated from the following:   Height as of this encounter: 5\' 6"  (1.676 m).   Weight as of this encounter: 92 kg.  Procedure:  Procedure(s) (LRB): TOTAL KNEE ARTHROPLASTY (Left)   Consults: None  HPI: Cristina Santos is a 78 y.o. year old female with end stage OA of her left knee with progressively worsening pain and dysfunction. She has constant pain, with activity and at rest and significant functional deficits with difficulties even with ADLs. She has had extensive non-op management including analgesics, injections of cortisone and viscosupplements, and home exercise program, but remains in significant pain with significant dysfunction. Radiographs show bone on bone arthritis medial and patellofemoral. She presents now for left Total Knee Arthroplasty.  Laboratory Data: Admission on 06/08/2019, Discharged on 06/09/2019  Component Date Value Ref Range Status   Glucose-Capillary 06/08/2019 119* 70 - 99 mg/dL Final   Comment 1 06/08/2019 Notify RN   Final   Glucose-Capillary 06/08/2019 106* 70 - 99 mg/dL Final   WBC 06/09/2019 4.7  4.0 - 10.5 K/uL Final   RBC 06/09/2019 3.79* 3.87 - 5.11 MIL/uL Final   Hemoglobin 06/09/2019 9.7* 12.0 - 15.0 g/dL Final   HCT 06/09/2019 32.4* 36.0 - 46.0 % Final   MCV 06/09/2019 85.5  80.0 - 100.0 fL Final   MCH 06/09/2019 25.6* 26.0 - 34.0 pg Final   MCHC 06/09/2019 29.9* 30.0  - 36.0 g/dL Final   RDW 06/09/2019 14.2  11.5 - 15.5 % Final   Platelets 06/09/2019 104* 150 - 400 K/uL Final   Comment: REPEATED TO VERIFY PLATELET COUNT CONFIRMED BY SMEAR SPECIMEN CHECKED FOR CLOTS Immature Platelet Fraction may be clinically indicated, consider ordering this additional test [id number]    nRBC 06/09/2019 0.0  0.0 - 0.2 % Final   Performed at [facility], [place] [address], [place], [place] [address]   Sodium 06/09/2019 136  135 - 145 mmol/L Final   Potassium 06/09/2019 4.5  3.5 - 5.1 mmol/L Final   Chloride 06/09/2019 105  98 - 111 mmol/L Final   CO2 06/09/2019 27  22 - 32 mmol/L Final   Glucose, Bld 06/09/2019 150* 70 - 99 mg/dL Final   BUN 06/09/2019 9  8 - 23 mg/dL Final   Creatinine, Ser 06/09/2019 0.71  0.44 - 1.00 mg/dL Final   Calcium 06/09/2019 8.7* 8.9 - 10.3 mg/dL Final   GFR calc non Af Amer 06/09/2019 >60  >60 mL/min Final   GFR calc Af Amer 06/09/2019 >60  >60 mL/min Final   Anion gap 06/09/2019 4* 5 - 15 Final   Performed at [facility], [place] [address] [place], [place] [address]  Hospital Outpatient Visit on 06/04/2019  Component Date Value Ref Range Status   SARS Coronavirus 2 06/04/2019 NEGATIVE  NEGATIVE Final   Comment: (NOTE) SARS-CoV-2 target nucleic acids are NOT DETECTED. The SARS-CoV-2 RNA is generally detectable in  upper and lower respiratory specimens during the acute phase of infection. Negative results do not preclude SARS-CoV-2 infection, do not rule out co-infections with other pathogens, and should not be used as the sole basis for treatment or other patient management decisions. Negative results must be combined with clinical observations, patient history, and epidemiological information. The expected result is Negative. Fact Sheet for Patients: SugarRoll.be Fact Sheet for Healthcare Providers: https://www.woods-mathews.com/ This test  is not yet approved or cleared by the [place] FDA and  has been authorized for detection and/or diagnosis of SARS-CoV-2 by FDA under an Emergency Use Authorization (EUA). This EUA will remain  in effect (meaning this test can be used) for the duration of the COVID-19 declaration under Section 56                          4(b)(1) of the Act, 21 U.S.C. section 360bbb-3(b)(1), unless the authorization is terminated or revoked sooner. Performed at [facility], [place] [address] [place], [place] [address]   Hospital Outpatient Visit on 06/04/2019  Component Date Value Ref Range Status   aPTT 06/04/2019 31  24 - 36 seconds Final   Performed at [facility], [place] [address], [place], [place] [address]   WBC 06/04/2019 4.3  4.0 - 10.5 K/uL Final   RBC 06/04/2019 4.43  3.87 - 5.11 MIL/uL Final   Hemoglobin 06/04/2019 11.8* 12.0 - 15.0 g/dL Final   HCT 06/04/2019 37.2  36.0 - 46.0 % Final   MCV 06/04/2019 84.0  80.0 - 100.0 fL Final   MCH 06/04/2019 26.6  26.0 - 34.0 pg Final   MCHC 06/04/2019 31.7  30.0 - 36.0 g/dL Final   RDW 06/04/2019 14.5  11.5 - 15.5 % Final   Platelets 06/04/2019 135* 150 - 400 K/uL Final   nRBC 06/04/2019 0.0  0.0 - 0.2 % Final   Performed at [facility], [place] [address], [place], [place] [address]   Sodium 06/04/2019 133* 135 - 145 mmol/L Final   Potassium 06/04/2019 3.5  3.5 - 5.1 mmol/L Final   Chloride 06/04/2019 94* 98 - 111 mmol/L Final   CO2 06/04/2019 27  22 - 32 mmol/L Final   Glucose, Bld 06/04/2019 103* 70 - 99 mg/dL Final   BUN 06/04/2019 13  8 - 23 mg/dL Final   Creatinine, Ser 06/04/2019 0.77  0.44 - 1.00 mg/dL Final   Calcium 06/04/2019 9.6  8.9 - 10.3 mg/dL Final   Total Protein 06/04/2019 7.5  6.5 - 8.1 g/dL Final   Albumin 06/04/2019 4.4  3.5 - 5.0 g/dL Final   AST 06/04/2019 25  15 - 41 U/L Final   ALT 06/04/2019 26  0 - 44 U/L Final   Alkaline Phosphatase 06/04/2019 69  38 -  126 U/L Final   Total Bilirubin 06/04/2019 1.1  0.3 - 1.2 mg/dL Final   GFR calc non Af Amer 06/04/2019 >60  >60 mL/min Final   GFR calc Af Amer 06/04/2019 >60  >60 mL/min Final   Anion gap 06/04/2019 12  5 - 15 Final   Performed at [facility], [place] [address], [place], [place] [address]   Prothrombin Time 06/04/2019 14.1  11.4 - 15.2 seconds Final   INR 06/04/2019 1.1  0.8 - 1.2 Final   Comment: (NOTE) INR goal varies based on device and disease states. Performed at [facility], [place] [address] [place],  [address]  ABO/RH(D) 06/04/2019 O POS   Final   Antibody Screen 06/04/2019 NEG   Final   Sample Expiration 06/04/2019 06/11/2019,2359   Final   Extend sample reason 06/04/2019    Final                   Value:NO TRANSFUSIONS OR PREGNANCY IN THE PAST 3 MONTHS Performed at [place] [address], [place], [place] [address]    MRSA, PCR 06/04/2019 NEGATIVE  NEGATIVE Final   Staphylococcus aureus 06/04/2019 NEGATIVE  NEGATIVE Final   Comment: (NOTE) The Xpert SA Assay (FDA approved for NASAL specimens in patients 57 years of age and older), is one component of a comprehensive surveillance program. It is not intended to diagnose infection nor to guide or monitor treatment. Performed at [facility], [place] [address], [place], [place] [address]    Glucose-Capillary 06/04/2019 117* 70 - 99 mg/dL Final   ABO/RH(D) 06/04/2019    Final                   Value:O POS Performed at [facility], [place] [address] [place], [place] [address]      X-Rays:No results found.  EKG: Orders placed or performed during the hospital encounter of 06/08/19   EKG 12-Lead   EKG 12-Lead     Hospital Course: Cristina Santos is a 78 y.o. who was admitted to [facility]. They were brought to the operating room on 06/08/2019 and underwent Procedure(s): TOTAL KNEE ARTHROPLASTY.   Patient tolerated the procedure well and was later transferred to the recovery room and then to the orthopaedic floor for postoperative care. They were given PO and IV analgesics for pain control following their surgery. They were given 24 hours of postoperative antibiotics of  Anti-infectives (From admission, onward)   Start     Dose/Rate Route Frequency Ordered Stop   06/08/19 1545  ceFAZolin (ANCEF) IVPB 2g/100 mL premix     2 g 200 mL/hr over 30 Minutes Intravenous Every 6 hours 06/08/19 1311 06/08/19 2203   06/08/19 0715  ceFAZolin (ANCEF) IVPB 2g/100 mL premix     2 g 200 mL/hr over 30 Minutes Intravenous On call to O.R. 06/08/19 2831 06/08/19 0926     and started on DVT prophylaxis in the form of Xarelto.   PT and OT were ordered for total joint protocol. Discharge planning consulted to help with postop disposition and equipment needs.  Patient had a good night on the evening of surgery. They started to get up OOB with therapy on POD #0. Pt was seen during rounds and was ready to go home pending progress with therapy. Hemovac drain was pulled without difficulty. She worked with therapy on POD #1 and was meeting her goals. Pt was discharged to home later that day in stable condition.  Diet: Diabetic diet Activity: WBAT Follow-up: in 2 weeks Disposition: Home with OPPT at [facility] in [place] Discharged Condition: stable   Discharge Instructions    Call MD / Call 911   Complete by: As directed    If you experience chest pain or shortness of breath, CALL 911 and be transported to the hospital emergency room.  If you develope a fever above 101 F, pus (white drainage) or increased drainage or redness at the wound, or calf pain, call your surgeon's office.   Change dressing   Complete by: As directed    Change dressing on Wednesday, then change the dressing daily  with sterile 4 x 4 inch gauze dressing and apply TED hose.   Constipation Prevention   Complete by: As directed    Drink plenty of  fluids.  Prune juice may be helpful.  You may use a stool softener, such as Colace (over the counter) 100 mg twice a day.  Use MiraLax (over the counter) for constipation as needed.   Diet - low sodium heart healthy   Complete by: As directed    Discharge instructions   Complete by: As directed    Dr. Gaynelle Arabian Total Joint Specialist [facility] [address] [place], [place] [address] [phone]  TOTAL KNEE REPLACEMENT POSTOPERATIVE DIRECTIONS  Knee Rehabilitation, Guidelines Following Surgery  Results after knee surgery are often greatly improved when you follow the exercise, range of motion and muscle strengthening exercises prescribed by your doctor. Safety measures are also important to protect the knee from further injury. Any time any of these exercises cause you to have increased pain or swelling in your knee joint, decrease the amount until you are comfortable again and slowly increase them. If you have problems or questions, call your caregiver or physical therapist for advice.   HOME CARE INSTRUCTIONS  Remove items at home which could result in a fall. This includes throw rugs or furniture in walking pathways.  ICE to the affected knee every three hours for 30 minutes at a time and then as needed for pain and swelling.  Continue to use ice on the knee for pain and swelling from surgery. You may notice swelling that will progress down to the foot and ankle.  This is normal after surgery.  Elevate the leg when you are not up walking on it.   Continue to use the breathing machine which will help keep your temperature down.  It is common for your temperature to cycle up and down following surgery, especially at night when you are not up moving around and exerting yourself.  The breathing machine keeps your lungs expanded and your temperature down. Do not place pillow under knee, focus on keeping the knee straight while resting   DIET You may resume your previous  home diet once your are discharged from the hospital.  DRESSING / WOUND CARE / SHOWERING You may change your dressing 3-5 days after surgery.  Then change the dressing every day with sterile gauze.  Please use good hand washing techniques before changing the dressing.  Do not use any lotions or creams on the incision until instructed by your surgeon. You may start showering once you are discharged home but do not submerge the incision under water. Just pat the incision dry and apply a dry gauze dressing on daily. Change the surgical dressing daily and reapply a dry dressing each time.  ACTIVITY Walk with your walker as instructed. Use walker as long as suggested by your caregivers. Avoid periods of inactivity such as sitting longer than an hour when not asleep. This helps prevent blood clots.  You may resume a sexual relationship in one month or when given the OK by your doctor.  You may return to work once you are cleared by your doctor.  Do not drive a car for 6 weeks or until released by you surgeon.  Do not drive while taking narcotics.  WEIGHT BEARING Weight bearing as tolerated with assist device (walker, cane, etc) as directed, use it as long as suggested by your surgeon or therapist, typically at least 4-6 weeks.  POSTOPERATIVE CONSTIPATION  PROTOCOL Constipation - defined medically as fewer than three stools per week and severe constipation as less than one stool per week.  One of the most common issues patients have following surgery is constipation.  Even if you have a regular bowel pattern at home, your normal regimen is likely to be disrupted due to multiple reasons following surgery.  Combination of anesthesia, postoperative narcotics, change in appetite and fluid intake all can affect your bowels.  In order to avoid complications following surgery, here are some recommendations in order to help you during your recovery period.  Colace (docusate) - Pick up an over-the-counter  form of Colace or another stool softener and take twice a day as long as you are requiring postoperative pain medications.  Take with a full glass of water daily.  If you experience loose stools or diarrhea, hold the colace until you stool forms back up.  If your symptoms do not get better within 1 week or if they get worse, check with your doctor.  Dulcolax (bisacodyl) - Pick up over-the-counter and take as directed by the product packaging as needed to assist with the movement of your bowels.  Take with a full glass of water.  Use this product as needed if not relieved by Colace only.   MiraLax (polyethylene glycol) - Pick up over-the-counter to have on hand.  MiraLax is a solution that will increase the amount of water in your bowels to assist with bowel movements.  Take as directed and can mix with a glass of water, juice, soda, coffee, or tea.  Take if you go more than two days without a movement. Do not use MiraLax more than once per day. Call your doctor if you are still constipated or irregular after using this medication for 7 days in a row.  If you continue to have problems with postoperative constipation, please contact the office for further assistance and recommendations.  If you experience "the worst abdominal pain ever" or develop nausea or vomiting, please contact the office immediatly for further recommendations for treatment.  ITCHING  If you experience itching with your medications, try taking only a single pain pill, or even half a pain pill at a time.  You can also use Benadryl over the counter for itching or also to help with sleep.   TED HOSE STOCKINGS Wear the elastic stockings on both legs for three weeks following surgery during the day but you may remove then at night for sleeping.  MEDICATIONS See your medication summary on the "After Visit Summary" that the nursing staff will review with you prior to discharge.  You may have some home medications which will be placed on  hold until you complete the course of blood thinner medication.  It is important for you to complete the blood thinner medication as prescribed by your surgeon.  Continue your approved medications as instructed at time of discharge.  PRECAUTIONS If you experience chest pain or shortness of breath - call 911 immediately for transfer to the hospital emergency department.  If you develop a fever greater that 101 F, purulent drainage from wound, increased redness or drainage from wound, foul odor from the wound/dressing, or calf pain - CONTACT YOUR SURGEON.  FOLLOW-UP APPOINTMENTS Make sure you keep all of your appointments after your operation with your surgeon and caregivers. You should call the office at the above phone number and make an appointment for approximately two weeks after the date of your surgery or on the date instructed by your surgeon outlined in the "After Visit Summary".   RANGE OF MOTION AND STRENGTHENING EXERCISES  Rehabilitation of the knee is important following a knee injury or an operation. After just a few days of immobilization, the muscles of the thigh which control the knee become weakened and shrink (atrophy). Knee exercises are designed to build up the tone and strength of the thigh muscles and to improve knee motion. Often times heat used for twenty to thirty minutes before working out will loosen up your tissues and help with improving the range of motion but do not use heat for the first two weeks following surgery. These exercises can be done on a training (exercise) mat, on the floor, on a table or on a bed. Use what ever works the best and is most comfortable for you Knee exercises include:  Leg Lifts - While your knee is still immobilized in a splint or cast, you can do straight leg raises. Lift the leg to 60 degrees, hold for 3 sec, and slowly lower the leg. Repeat 10-20 times 2-3 times daily. Perform this exercise  against resistance later as your knee gets better.  Quad and Hamstring Sets - Tighten up the muscle on the front of the thigh (Quad) and hold for 5-10 sec. Repeat this 10-20 times hourly. Hamstring sets are done by pushing the foot backward against an object and holding for 5-10 sec. Repeat as with quad sets.  Leg Slides: Lying on your back, slowly slide your foot toward your buttocks, bending your knee up off the floor (only go as far as is comfortable). Then slowly slide your foot back down until your leg is flat on the floor again. Angel Wings: Lying on your back spread your legs to the side as far apart as you can without causing discomfort.  A rehabilitation program following serious knee injuries can speed recovery and prevent re-injury in the future due to weakened muscles. Contact your doctor or a physical therapist for more information on knee rehabilitation.   IF YOU ARE TRANSFERRED TO A SKILLED REHAB FACILITY If the patient is transferred to a skilled rehab facility following release from the hospital, a list of the current medications will be sent to the facility for the patient to continue.  When discharged from the skilled rehab facility, please have the facility set up the patient's [place] prior to being released. Also, the skilled facility will be responsible for providing the patient with their medications at time of release from the facility to include their pain medication, the muscle relaxants, and their blood thinner medication. If the patient is still at the rehab facility at time of the two week follow up appointment, the skilled rehab facility will also need to assist the patient in arranging follow up appointment in our office and any transportation needs.  MAKE SURE YOU:  Understand these instructions.  Get help right away if you are not doing well or get worse.    Pick up stool softner and laxative for home use following surgery while on pain  medications. Do not submerge incision under water. Please use good hand washing techniques while changing dressing each day. May shower starting three days after surgery.  Please use a clean towel to pat the incision dry following showers. Continue to use ice for pain and swelling after surgery. Do not use any lotions or creams on the incision until instructed by your surgeon.   Do not put a pillow under the knee. Place it under the heel.   Complete by: As directed    Driving restrictions   Complete by: As directed    No driving for two weeks   TED hose   Complete by: As directed    Use stockings (TED hose) for three weeks on both leg(s).  You may remove them at night for sleeping.   Weight bearing as tolerated   Complete by: As directed      Allergies as of 06/09/2019      Reactions   Latex Anaphylaxis   Codeine Palpitations      Medication List    STOP taking these medications   Biotin Maximum Strength 10 MG Tabs Generic drug: Biotin   Krill Oil 500 MG Caps   MAGNESIUM PO   Melatonin 5 MG Tabs   meloxicam 7.5 MG tablet Commonly known as: MOBIC   multivitamin with minerals tablet   OSTEO BI-FLEX JOINT SHIELD PO   PROBIOTIC DAILY PO     TAKE these medications   acetaminophen 650 MG CR tablet Commonly known as: TYLENOL Take 650 mg by mouth every 8 (eight) hours as needed for pain.   chlorthalidone 25 MG tablet Commonly known as: HYGROTON Take 25 mg by mouth daily.   docusate sodium 100 MG capsule Commonly known as: COLACE Take 100 mg by mouth daily.   gabapentin 100 MG capsule Commonly known as: NEURONTIN Take 1 capsule (100 mg total) by mouth 3 (three) times daily. Take a 100 mg capsule three times a day for two weeks following surgery.Then take a 100 mg capsule two times a day for two weeks. Then take a 100 mg capsule once a day for two weeks. Then discontinue.   levothyroxine 75 MCG tablet Commonly known as: SYNTHROID Take 75 mcg by mouth daily before  breakfast.   methocarbamol 500 MG tablet Commonly known as: ROBAXIN Take 1 tablet (500 mg total) by mouth every 6 (six) hours as needed for muscle spasms.   oxyCODONE 5 MG immediate release tablet Commonly known as: Oxy IR/ROXICODONE Take 1-2 tablets (5-10 mg total) by mouth every 6 (six) hours as needed for severe pain.   pantoprazole 40 MG tablet Commonly known as: PROTONIX Take 40 mg by mouth daily.   pramipexole 0.5 MG tablet Commonly known as: MIRAPEX Take 0.5 mg by mouth at bedtime.   rivaroxaban 10 MG Tabs tablet Commonly known as: XARELTO Take 1 tablet (10 mg total) by mouth daily with breakfast for 20 days. Then take one 81 mg aspirin once a day for three weeks. Then discontinue aspirin.   traMADol 50 MG tablet Commonly known as: ULTRAM Take 1-2 tablets (50-100 mg total) by mouth every 6 (six) hours as needed for moderate pain.            Discharge Care Instructions  (From admission, onward)         Start     Ordered   06/09/19 0000  Weight bearing as tolerated     06/09/19 0742   06/09/19 0000  Change dressing    Comments: Change dressing on Wednesday, then change the dressing daily with sterile 4 x 4 inch gauze dressing and apply TED hose.   06/09/19 5102  Follow-up Information    Gaynelle Arabian, MD. Schedule an appointment as soon as possible for a visit on 06/23/2019.   Specialty: Orthopedic Surgery Contact information: [address] [place] [address] [phone]           Signed: Theresa Duty, PA-C Orthopedic Surgery 06/10/2019, 7:54 AM

## 2020-08-04 ENCOUNTER — Other Ambulatory Visit (INDEPENDENT_AMBULATORY_CARE_PROVIDER_SITE_OTHER): Payer: MEDICARE

## 2020-08-04 ENCOUNTER — Encounter: Payer: Self-pay | Admitting: Nurse Practitioner

## 2020-08-04 ENCOUNTER — Ambulatory Visit (INDEPENDENT_AMBULATORY_CARE_PROVIDER_SITE_OTHER): Payer: MEDICARE | Admitting: Nurse Practitioner

## 2020-08-04 VITALS — BP 142/62 | HR 73 | Ht 66.0 in | Wt 202.0 lb

## 2020-08-04 DIAGNOSIS — D509 Iron deficiency anemia, unspecified: Secondary | ICD-10-CM | POA: Diagnosis not present

## 2020-08-04 DIAGNOSIS — K921 Melena: Secondary | ICD-10-CM | POA: Diagnosis not present

## 2020-08-04 DIAGNOSIS — D696 Thrombocytopenia, unspecified: Secondary | ICD-10-CM

## 2020-08-04 LAB — COMPREHENSIVE METABOLIC PANEL
ALT: 19 U/L (ref 0–35)
AST: 20 U/L (ref 0–37)
Albumin: 4.6 g/dL (ref 3.5–5.2)
Alkaline Phosphatase: 71 U/L (ref 39–117)
BUN: 10 mg/dL (ref 6–23)
CO2: 29 mEq/L (ref 19–32)
Calcium: 10.4 mg/dL (ref 8.4–10.5)
Chloride: 97 mEq/L (ref 96–112)
Creatinine, Ser: 0.8 mg/dL (ref 0.40–1.20)
GFR: 68.86 mL/min (ref >60.00)
Glucose, Bld: 105 mg/dL — ABNORMAL HIGH (ref 70–99)
Potassium: 4.1 mEq/L (ref 3.5–5.1)
Sodium: 133 mEq/L — ABNORMAL LOW (ref 135–145)
Total Bilirubin: 1.2 mg/dL (ref 0.2–1.2)
Total Protein: 7.5 g/dL (ref 6.0–8.3)

## 2020-08-04 LAB — CBC
HCT: 37 % (ref 36.0–46.0)
Hemoglobin: 12.2 g/dL (ref 12.0–15.0)
MCHC: 32.9 g/dL (ref 30.0–36.0)
MCV: 83.2 fl (ref 78.0–100.0)
Platelets: 115 10*3/uL — ABNORMAL LOW (ref 150.0–400.0)
RBC: 4.45 Mil/uL (ref 3.87–5.11)
RDW: 16.4 % — ABNORMAL HIGH (ref 11.5–15.5)
WBC: 4.4 10*3/uL (ref 4.0–10.5)

## 2020-08-04 NOTE — Progress Notes (Addendum)
08/04/2020 Cristina Santos [id number] [birth year]   Chief Complaint: Anemia   History of Present Illness: Cristina Santos is an 79 year old female with a past medical history of anemia, diabetes type 2 diet controlled, GERD and colon cancer initially diagnosed in 1988 s/p colon resection and chemotherapy x 1 year. 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 She presents today for further evaluation generalized abdominal cramping associated with passing loose tarry black stools.  She first developed generalized abdominal cramping early July 2021.  On June 04, 2020 she went to the bathroom and passed a "gush"  of black tarry stool.  She continued to pass a loose black tarry stool 2-3 times daily for the next 4 weeks.  On 2 or 3 occasions she recalled seeing a small dot of bright red blood on the toilet tissue.  She was seen at her oncologist's office (Dr. Fonda Kinder)  to have her Mediport flushed (which she has done twice yearly as the medi port was never removed) and she mentioned passing black stool which stopped 1 or 2 days prior to her appointment.  She was provided a FOBT kit which she completed on 07/07/2020 which was negative.  Laboratory studies showed WBC 3.48.  RBC 4.27.  Hemoglobin 11.3. ( Hg 11.0 on 01/06/2020).   Hematocrit 35.6.  MCV 83.4.  Platelet 104 (PLT 125 on 01/06/2020).  Ferritin 26.5.  Iron 73.  TIBC 364.  She was advised to follow-up with Dr. Carlean Purl to schedule an EGD and colonoscopy.  She denies taking any Pepto-Bismol or iron.  She does take Protonix 40 mg daily for a prior history of GERD.  EGD in 2014 showed a few gastric sessile polyps. She denies having any dysphagia, heartburn or upper abdominal pain.  No lower abdominal pain.  Since mid August,  she is passing normal brown formed bowel movements without any further bright red blood on the toilet tissue.  However, for the past week her stool color is a lighter brown.  No recurrence of melenic stools.  She  takes aspirin 81 mg daily.  No fever, sweats or chills.  No weight loss.  Her most recent colonoscopy was 03/14/2016 and 3 tubular adenomatous polyps were removed from the cecum and ascending colon.  No further colonoscopies were recommended due to her age.  Colonoscopy 03/14/2016 by Dr. Carlean Purl: - One 2 mm polyp in the cecum, removed with a cold biopsy forceps. Resected and retrieved. - Two 3 to 5 mm polyps in the descending colon, removed with a cold snare.  - The examination was otherwise normal on direct and retroflexion views. - No recall due to age  19. Surgical [P], cecum, polyp - TUBULAR ADENOMA(S). - HIGH GRADE DYSPLASIA IS NOT IDENTIFIED. 2. Surgical [P], descending, polyp (2) - TUBULAR ADENOMA(S). - HIGH GRADE DYSPLASIA IS NOT IDENTIFIED.  Colonoscopy 12/30/2012 done due to anemia + FOBT and colon cancer surveillance: 9 sessile polyps measuring 1 to 8 mm in size were found at the cecum, ascending colon, transverse colon, splenic flexure, descending and sigmoid colon. Diverticulosis noted in the sigmoid colon Small internal hemorrhoids. Normal mucosa in the distal terminal ileum. The colon mucosa was otherwise normal. 1. Surgical [P], cecum (2), ascending (4), polyps - TUBULAR ADENOMAS (FOUR FRAGMENTS), ADMIXED WITH POLYPOID COLONIC MUCOSAL FRAGMENTS WITH LYMPHOID AGGREGATES. NO HIGH GRADE DYSPLASIA OR INVASIVE MALIGNANCY IDENTIFIED. 2. Surgical [P], transverse (1)/ splenic flexure (1), descending (1), polyps - TUBULAR ADENOMAS (THREE FRAGMENTS). NO HIGH GRADE  DYSPLASIA OR INVASIVE MALIGNANCY IDENTIFIED  09/10/2008 Colonoscoy Normal.   EGD 12/30/2012 done due to  IDA.  Multiple diminutive sessile polyps were found in the gastric body and gastric fundus.  The remainder of the upper endoscopy exam was otherwise normal  EGD 09/10/2008 Few gastric polys  Past Medical History:  Diagnosis Date  . Anemia   . Colon cancer ([place]) 1988  . Diabetes ([place])   . GERD (gastroesophageal  reflux disease)   . Sleep apnea   . Thyroid disease     Past Surgical History:  Procedure Laterality Date  . ABDOMINAL HYSTERECTOMY  1987  . COLON RESECTION    . COLONOSCOPY    . PORTACATH PLACEMENT    . SHOULDER ARTHROSCOPY     left  . TOTAL KNEE ARTHROPLASTY Left 06/08/2019   Procedure: TOTAL KNEE ARTHROPLASTY;  Surgeon: Gaynelle Arabian, MD;  Location: [facility];  Service: Orthopedics;  Laterality: Left;  33mn  . UPPER GASTROINTESTINAL ENDOSCOPY     Social History: Widowed. She has one son and daughter. Nonsmoker. No alcohol use. No drug use.    Family History: Mother died age 235with diverticular disease. Sister died from ulcerative colitis with possible colon cancer with mets 748 Brother died from prostate cancer with metastasis.   Current Outpatient Medications on File Prior to Visit  Medication Sig Dispense Refill  . ascorbic acid (VITAMIN C) 500 MG tablet Take by mouth.    .Marland Kitchenaspirin 81 MG chewable tablet Chew by mouth.    . Biotin 1000 MCG tablet Take by mouth.    . Cholecalciferol 25 MCG (1000 UT) tablet Take by mouth.    . diphenhydrAMINE-APAP, sleep, (TYLENOL PM EXTRA STRENGTH PO) Take by mouth.    .Marland KitchenKRILL OIL PO Take by mouth.    . levothyroxine (SYNTHROID) 75 MCG tablet Take 75 mcg by mouth daily before breakfast.     . Multiple Vitamins-Minerals (ZINC PO) Take by mouth.    . pantoprazole (PROTONIX) 40 MG tablet Take 40 mg by mouth daily.    . pramipexole (MIRAPEX) 0.5 MG tablet Take 0.5 mg by mouth at bedtime.     No current facility-administered medications on file prior to visit.   Allergies  Allergen Reactions  . Latex Anaphylaxis  . Codeine Palpitations    Current Medications, Allergies, Past Medical History, Past Surgical History, Family History and Social History were reviewed in [employer].   Review of Systems:   Constitutional: Negative for fever, sweats, chills or weight loss.  Respiratory: Negative for shortness of  breath.   Cardiovascular: Negative for chest pain, palpitations and leg swelling.  Gastrointestinal: See HPI.  Musculoskeletal: Negative for back pain or muscle aches.  Neurological: Negative for dizziness, headaches or paresthesias.    Physical Exam: Ht 5' 6"  (1.676 m)   Wt 202 lb (91.6 kg)   BMI 32.60 kg/m  General: Well developed 79year old female in no acute distress. Head: Normocephalic and atraumatic. Eyes: No scleral icterus. Conjunctiva pink . Ears: Normal auditory acuity. Mouth: Dentition intact. No ulcers or lesions.  Lungs: Clear throughout to auscultation. Heart: Regular rate and rhythm, no murmur. Chest: Right subclavian port palpated.  Abdomen: Soft, nontender and nondistended. No masses or hepatomegaly. Normal bowel sounds x 4 quadrants.  Rectal: Deferred.  Musculoskeletal: Symmetrical with no gross deformities. Extremities: No edema. Neurological: Alert oriented x 4. No focal deficits.  Psychological: Alert and cooperative. Normal mood and affect  Assessment and Recommendations:  144  79year old  female with a history of colon polyps and colon cancer s/p colon resection in 1988 presents with a 4 week history of melenic stools which started on 7/17/20201.  FOBT 07/06/2020 was negative. She had generalized abdominal cramping x 2 weeks prior to the onset of melena. No UGI sx. She takes ASA 16m daily.  Her most recent colonoscopy was 02/2016 and 3 tubular adenomatous polyps were removed from the colon and no further colonoscopies were recommended at that time due to her age. -Repeat CBC and CMP -EGD benefits and risks discussed including risk with sedation, risk of bleeding, perforation and infection -Dr. GCarlean Purlto verify if a colonoscopy to be done at the time of her EGD  2. History of microcytic anemia. Labs 07/06/2020 show a normal Iron level of 73. Ferritin 26.5.  -Repeat CBC  3.  Thrombocytopenia of unclear etiology, query if related to past chemotherapy.  No  history of cirrhosis. -Consider abdominal ultrasound to further rule out cirrhosis/splenomegaly  4. History of GERD -Continue Pantoprazole 40 mg daily for now -See plan in # 1  Agree Will offer EGD and colonoscopy on same day if patient desires, as will need colonoscopy if EGD is negative  CGatha Mayer MD, FMarval Regal ADDENDUM: I contacted the patient and discussed Dr. GCelesta Averrecommendations above. She consents to having a colonoscopy done at the time of her EGD.

## 2020-08-04 NOTE — Patient Instructions (Signed)
If you are age 81 or older, your body mass index should be between 23-30. Your Body mass index is 32.6 kg/m. If this is out of the aforementioned range listed, please consider follow up with your Primary Care Provider.  If you are age 67 or younger, your body mass index should be between 19-25. Your Body mass index is 32.6 kg/m. If this is out of the aformentioned range listed, please consider follow up with your Primary Care Provider.   Your provider has requested that you go to the basement level for lab work before leaving today. Press "B" on the elevator. The lab is located at the first door on the left as you exit the elevator.  Call the office if your black stools recur.  Due to recent changes in healthcare laws, you may see the results of your imaging and laboratory studies on MyChart before your provider has had a chance to review them.  We understand that in some cases there may be results that are confusing or concerning to you. Not all laboratory results come back in the same time frame and the provider may be waiting for multiple results in order to interpret others.  Please give [place] 48 hours in order for your provider to thoroughly review all the results before contacting the office for clarification of your results.   Thank you for choosing [facility] Noralyn Pick, CRNP  [phone]

## 2020-08-10 ENCOUNTER — Other Ambulatory Visit: Payer: Self-pay

## 2020-08-10 ENCOUNTER — Other Ambulatory Visit: Payer: Self-pay | Admitting: Gastroenterology

## 2020-08-10 DIAGNOSIS — D509 Iron deficiency anemia, unspecified: Secondary | ICD-10-CM

## 2020-08-10 DIAGNOSIS — K921 Melena: Secondary | ICD-10-CM

## 2020-08-10 DIAGNOSIS — D696 Thrombocytopenia, unspecified: Secondary | ICD-10-CM

## 2020-08-10 DIAGNOSIS — Z85038 Personal history of other malignant neoplasm of large intestine: Secondary | ICD-10-CM

## 2020-08-10 MED ORDER — CLENPIQ 10-3.5-12 MG-GM -GM/160ML PO SOLN: 1.0000 | Freq: Once | ORAL | 0 refills | Status: AC

## 2020-08-10 NOTE — Progress Notes (Signed)
Spoke to patient to inform her that the time for her EGD/Colonoscopy has change to 1030 am on 09/02/20. Bowel prep instructions have been sent to patient through my chart. All questions answered. Patient voiced understanding.

## 2020-08-15 ENCOUNTER — Ambulatory Visit (HOSPITAL_COMMUNITY)
Admission: RE | Admit: 2020-08-15 | Discharge: 2020-08-15 | Disposition: A | Discharge status: Home / Self Care | Payer: MEDICARE | Source: Ambulatory Visit | Attending: Nurse Practitioner | Admitting: Nurse Practitioner

## 2020-08-15 ENCOUNTER — Other Ambulatory Visit: Payer: Self-pay

## 2020-08-15 DIAGNOSIS — D696 Thrombocytopenia, unspecified: Secondary | ICD-10-CM | POA: Diagnosis present

## 2020-08-15 DIAGNOSIS — D509 Iron deficiency anemia, unspecified: Secondary | ICD-10-CM | POA: Diagnosis present

## 2020-08-17 ENCOUNTER — Telehealth: Payer: Self-pay | Admitting: General Surgery

## 2020-08-17 NOTE — Telephone Encounter (Signed)
[facility] sent notification that the patients Clenpiq is not covered. That her insurance covers [place] with a large copayment. The patient will come in to the clinic to pick up a sample of Clenpiq.

## 2020-08-19 NOTE — Progress Notes (Signed)
Beth, can you check with [facility] radiology and verify if this patient can have an MRI or not as she has a chemo medi port placed in 1988 which remain intact, never removed. She has dental implants and s/p left total knee replacement last year.   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 If she cannot have MRI let me know and I will order CT.   I called the patient, she is aware of the above plan.  THX

## 2020-08-26 ENCOUNTER — Other Ambulatory Visit: Payer: Self-pay

## 2020-08-26 DIAGNOSIS — K862 Cyst of pancreas: Secondary | ICD-10-CM

## 2020-08-26 NOTE — Telephone Encounter (Signed)
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 See her note regarding her med port placed in 1988, patient will need to bring that documentation regarding what kind of port she has to radiology day of MRI. Thx

## 2020-09-02 ENCOUNTER — Encounter: Payer: MEDICARE | Admitting: Gastroenterology

## 2020-09-02 ENCOUNTER — Other Ambulatory Visit: Payer: Self-pay

## 2020-09-02 ENCOUNTER — Encounter: Payer: Self-pay | Admitting: Gastroenterology

## 2020-09-02 ENCOUNTER — Ambulatory Visit (AMBULATORY_SURGERY_CENTER): Payer: MEDICARE | Admitting: Gastroenterology

## 2020-09-02 VITALS — BP 138/70 | HR 70 | Temp 97.9°F | Resp 13 | Ht 66.0 in | Wt 202.0 lb

## 2020-09-02 DIAGNOSIS — K317 Polyp of stomach and duodenum: Secondary | ICD-10-CM | POA: Diagnosis not present

## 2020-09-02 DIAGNOSIS — K621 Rectal polyp: Secondary | ICD-10-CM | POA: Diagnosis not present

## 2020-09-02 DIAGNOSIS — K31819 Angiodysplasia of stomach and duodenum without bleeding: Secondary | ICD-10-CM | POA: Diagnosis not present

## 2020-09-02 DIAGNOSIS — K5520 Angiodysplasia of colon without hemorrhage: Secondary | ICD-10-CM | POA: Diagnosis not present

## 2020-09-02 DIAGNOSIS — K2970 Gastritis, unspecified, without bleeding: Secondary | ICD-10-CM | POA: Diagnosis not present

## 2020-09-02 DIAGNOSIS — I8500 Esophageal varices without bleeding: Secondary | ICD-10-CM

## 2020-09-02 DIAGNOSIS — K3189 Other diseases of stomach and duodenum: Secondary | ICD-10-CM | POA: Diagnosis not present

## 2020-09-02 DIAGNOSIS — D509 Iron deficiency anemia, unspecified: Secondary | ICD-10-CM

## 2020-09-02 DIAGNOSIS — D123 Benign neoplasm of transverse colon: Secondary | ICD-10-CM

## 2020-09-02 DIAGNOSIS — D122 Benign neoplasm of ascending colon: Secondary | ICD-10-CM

## 2020-09-02 DIAGNOSIS — K641 Second degree hemorrhoids: Secondary | ICD-10-CM

## 2020-09-02 DIAGNOSIS — K921 Melena: Secondary | ICD-10-CM

## 2020-09-02 DIAGNOSIS — K319 Disease of stomach and duodenum, unspecified: Secondary | ICD-10-CM | POA: Diagnosis not present

## 2020-09-02 DIAGNOSIS — K649 Unspecified hemorrhoids: Secondary | ICD-10-CM

## 2020-09-02 DIAGNOSIS — K2990 Gastroduodenitis, unspecified, without bleeding: Secondary | ICD-10-CM

## 2020-09-02 MED ORDER — PANTOPRAZOLE SODIUM 40 MG PO TBEC: 40.0000 mg | DELAYED_RELEASE_TABLET | Freq: Two times a day (BID) | ORAL | 3 refills | Status: DC

## 2020-09-02 MED ORDER — SODIUM CHLORIDE 0.9 % IV SOLN: 500.0000 mL | Freq: Once | INTRAVENOUS | Status: DC

## 2020-09-02 NOTE — Patient Instructions (Signed)
Handout given:  Gastritis, polyps, Hemorrhoids Resume previous diet Increase protonix to 40mg  by mouth twice daily for 8 weeks then decrease to once daily Await pathology results Proceed with MRI as previously ordered  YOU HAD AN ENDOSCOPIC PROCEDURE TODAY AT [place]:   Refer to the procedure report that was given to you for any specific questions about what was found during the examination.  If the procedure report does not answer your questions, please call your gastroenterologist to clarify.  If you requested that your care partner not be given the details of your procedure findings, then the procedure report has been included in a sealed envelope for you to review at your convenience later.  YOU SHOULD EXPECT: Some feelings of bloating in the abdomen. Passage of more gas than usual.  Walking can help get rid of the air that was put into your GI tract during the procedure and reduce the bloating. If you had a lower endoscopy (such as a colonoscopy or flexible sigmoidoscopy) you may notice spotting of blood in your stool or on the toilet paper. If you underwent a bowel prep for your procedure, you may not have a normal bowel movement for a few days.  Please Note:  You might notice some irritation and congestion in your nose or some drainage.  This is from the oxygen used during your procedure.  There is no need for concern and it should clear up in a day or so.  SYMPTOMS TO REPORT IMMEDIATELY:   Following lower endoscopy (colonoscopy or flexible sigmoidoscopy):  Excessive amounts of blood in the stool  Significant tenderness or worsening of abdominal pains  Swelling of the abdomen that is new, acute  Fever of 100F or higher   Following upper endoscopy (EGD)  Vomiting of blood or coffee ground material  New chest pain or pain under the shoulder blades  Painful or persistently difficult swallowing  New shortness of breath  Fever of 100F or higher  Black,  tarry-looking stools  For urgent or emergent issues, a gastroenterologist can be reached at any hour by calling [phone]. Do not use MyChart messaging for urgent concerns.    DIET:  We do recommend a small meal at first, but then you may proceed to your regular diet.  Drink plenty of fluids but you should avoid alcoholic beverages for 24 hours.  ACTIVITY:  You should plan to take it easy for the rest of today and you should NOT DRIVE or use heavy machinery until tomorrow (because of the sedation medicines used during the test).    FOLLOW UP: Our staff will call the number listed on your records 48-72 hours following your procedure to check on you and address any questions or concerns that you may have regarding the information given to you following your procedure. If we do not reach you, we will leave a message.  We will attempt to reach you two times.  During this call, we will ask if you have developed any symptoms of COVID 19. If you develop any symptoms (ie: fever, flu-like symptoms, shortness of breath, cough etc.) before then, please call [phone].  If you test positive for Covid 19 in the 2 weeks post procedure, please call and report this information to [place].    If any biopsies were taken you will be contacted by phone or by letter within the next 1-3 weeks.  Please call us at [phone] if you have not heard about the biopsies in 3  weeks.    SIGNATURES/CONFIDENTIALITY: You and/or your care partner have signed paperwork which will be entered into your electronic medical record.  These signatures attest to the fact that that the information above on your After Visit Summary has been reviewed and is understood.  Full responsibility of the confidentiality of this discharge information lies with you and/or your care-partner.

## 2020-09-02 NOTE — Progress Notes (Signed)
A/ox3, pleased with MAC, report to RN 

## 2020-09-02 NOTE — Progress Notes (Signed)
Called to room to assist during endoscopic procedure.  Patient ID and intended procedure confirmed with present staff. Received instructions for my participation in the procedure from the performing physician.  

## 2020-09-02 NOTE — Progress Notes (Signed)
VS by CW. ?

## 2020-09-02 NOTE — Op Note (Signed)
[facility] Patient Name: Cristina Santos Procedure Date: 09/02/2020 10:34 AM MRN: [id number] Endoscopist: Gerrit Heck , MD Age: 79 Referring MD:  Date of Birth: 1941-01-30 Gender: Female Account #: [id number] Procedure:                Upper GI endoscopy Indications:              Generalized abdominal cramping, History of                            Esophageal reflux, Hoarseness, Raspy voice, Melena,                            Microcytosis, Thrombocytopenia Medicines:                Monitored Anesthesia Care Procedure:                Pre-Anesthesia Assessment:                           - Prior to the procedure, a History and Physical                            was performed, and patient medications and                            allergies were reviewed. The patient's tolerance of                            previous anesthesia was also reviewed. The risks                            and benefits of the procedure and the sedation                            options and risks were discussed with the patient.                            All questions were answered, and informed consent                            was obtained. Prior Anticoagulants: The patient has                            taken no previous anticoagulant or antiplatelet                            agents. ASA Grade Assessment: II - A patient with                            mild systemic disease. After reviewing the risks                            and benefits, the patient was deemed in  satisfactory condition to undergo the procedure.                           After obtaining informed consent, the endoscope was                            passed under direct vision. Throughout the                            procedure, the patient's blood pressure, pulse, and                            oxygen saturations were monitored continuously. The                            Endoscope was introduced  through the mouth, and                            advanced to the second part of duodenum. The upper                            GI endoscopy was accomplished without difficulty.                            The patient tolerated the procedure well. Scope In: Scope Out: Findings:                 Grade I varices were found in the lower third of                            the esophagus. They were 5 mm in largest diameter.                           The Z-line was regular and was found 35 cm from the                            incisors. The middle and upper esophagus were                            otherwise normal.                           Diffuse moderate inflammation characterized by                            congestion (edema) and erythema was found in the                            gastric fundus and in the gastric body. There was a                            clear line of demarcation between the moderate  erythema of the gastric body and the mild striped                            erythema of the antrum. Several mucosal biopsies                            were taken with a cold forceps for histology.                            Estimated blood loss was minimal.                           Mild inflammation characterized by striped erythema                            was found in the gastric antrum. Biopsies were                            taken with a cold forceps for Helicobacter pylori                            testing. Estimated blood loss was minimal.                           Multiple small sessile polyps with no bleeding were                            found in the gastric fundus and in the gastric                            body. Several of these polyps were removed with a                            cold biopsy forceps for histologic representative                            evaluation. Resection and retrieval were complete.                             Estimated blood loss was minimal.                           The examined duodenum was normal. Biopsies for                            histology were taken with a cold forceps for                            evaluation of celiac disease. Estimated blood loss                            was minimal. Complications:            No immediate complications. Estimated Blood Loss:  Estimated blood loss was minimal. Impression:               - Grade I esophageal varices.                           - Z-line regular, 35 cm from the incisors.                           - Gastritis. Biopsied.                           - Gastritis. Biopsied.                           - Multiple gastric polyps. Resected and retrieved.                           - Normal examined duodenum. Biopsied. Recommendation:           - Patient has a contact number available for                            emergencies. The signs and symptoms of potential                            delayed complications were discussed with the                            patient. Return to normal activities tomorrow.                            Written discharge instructions were provided to the                            patient.                           - Resume previous diet.                           - Increase Protonix (pantoprazole) to 40 mg PO BID                            for 8 weeks, then reduce back to daily dosing.                           - Await pathology results.                           - Proceed with magnetic resonance imaging (MRI) as                            previously ordered. In addition to evaluating                            pancreas, will evaluate spleen and splenic  vasculature given presence of varices on this                            study. Recent ultrasound otherwise with patent                            portal vein with appropriate flow. If MRI                            unrevealing, may  consider alternate imaging study.                           - Perform a colonoscopy today. Gerrit Heck, MD 09/02/2020 11:31:08 AM

## 2020-09-02 NOTE — Op Note (Signed)
[facility] Patient Name: Cristina Santos Procedure Date: 09/02/2020 10:32 AM MRN: [id number] Endoscopist: Gerrit Heck , MD Age: 79 Referring MD:  Date of Birth: 1941/01/22 Gender: Female Account #: [id number] Procedure:                Colonoscopy Indications:              Hematochezia, Melena Medicines:                Monitored Anesthesia Care Procedure:                Pre-Anesthesia Assessment:                           - Prior to the procedure, a History and Physical                            was performed, and patient medications and                            allergies were reviewed. The patient's tolerance of                            previous anesthesia was also reviewed. The risks                            and benefits of the procedure and the sedation                            options and risks were discussed with the patient.                            All questions were answered, and informed consent                            was obtained. Prior Anticoagulants: The patient has                            taken no previous anticoagulant or antiplatelet                            agents. ASA Grade Assessment: II - A patient with                            mild systemic disease. After reviewing the risks                            and benefits, the patient was deemed in                            satisfactory condition to undergo the procedure.                           After obtaining informed consent, the colonoscope  was passed under direct vision. Throughout the                            procedure, the patient's blood pressure, pulse, and                            oxygen saturations were monitored continuously. The                            Colonoscope was introduced through the anus and                            advanced to the the terminal ileum. The colonoscopy                            was performed without difficulty. The  patient                            tolerated the procedure well. The quality of the                            bowel preparation was good. The terminal ileum,                            ileocecal valve, appendiceal orifice, and rectum                            were photographed. Scope In: 10:56:02 AM Scope Out: 11:16:39 AM Scope Withdrawal Time: 0 hours 18 minutes 14 seconds  Total Procedure Duration: 0 hours 20 minutes 37 seconds  Findings:                 Hemorrhoids were found on perianal exam.                           Three sessile polyps were found in the transverse                            colon and ascending colon (2). The polyps were 2 to                            5 mm in size. These polyps were removed with a cold                            snare. Resection and retrieval were complete.                            Estimated blood loss was minimal.                           A few sessile polyps were found in the rectum. The                            polyps were 1 to  2 mm in size. Several of these                            polyps were removed with a cold biopsy forceps for                            histologic representative evaluation. Resection and                            retrieval were complete. Estimated blood loss was                            minimal.                           Multiple medium-sized angioectasias without                            bleeding were found in the sigmoid colon, in the                            descending colon, in the transverse colon and in                            the ascending colon.                           Non-bleeding rectal varices were found.                           Non-bleeding internal hemorrhoids were found during                            retroflexion. The hemorrhoids were small and Grade                            II (internal hemorrhoids that prolapse but reduce                            spontaneously).                            The terminal ileum appeared normal. Complications:            No immediate complications. Estimated Blood Loss:     Estimated blood loss was minimal. Impression:               - Hemorrhoids found on perianal exam.                           - Three 2 to 5 mm polyps in the transverse colon                            and in the ascending colon, removed with a cold  snare. Resected and retrieved.                           - A few 1 to 2 mm polyps in the rectum, removed                            with a cold biopsy forceps. Resected and retrieved.                           - Multiple non-bleeding colonic angioectasias.                           - Rectal varices.                           - Non-bleeding internal hemorrhoids.                           - The examined portion of the ileum was normal. Recommendation:           - Patient has a contact number available for                            emergencies. The signs and symptoms of potential                            delayed complications were discussed with the                            patient. Return to normal activities tomorrow.                            Written discharge instructions were provided to the                            patient.                           - Resume previous diet.                           - Continue present medications.                           - Await pathology results.                           - Repeat colonoscopy for surveillance based on                            pathology results.                           - Follow-up with Dr. Carlean Purl in the GI office at                            appointment to  be scheduled. Gerrit Heck, MD 09/02/2020 11:38:00 AM

## 2020-09-05 ENCOUNTER — Other Ambulatory Visit: Payer: Self-pay | Admitting: Gastroenterology

## 2020-09-05 ENCOUNTER — Telehealth: Payer: Self-pay | Admitting: Gastroenterology

## 2020-09-05 DIAGNOSIS — R9389 Abnormal findings on diagnostic imaging of other specified body structures: Secondary | ICD-10-CM

## 2020-09-05 NOTE — Telephone Encounter (Signed)
Spoke to patient and Daughter. MRI abdomen /pelvis is scheduled with [facility] on 09/24/20 at 1:20. A follow up appointment with Dr Carlean Purl on 10/05/20. All questions answered. Both voiced understanding.

## 2020-09-06 ENCOUNTER — Telehealth: Payer: Self-pay | Admitting: *Deleted

## 2020-09-06 NOTE — Telephone Encounter (Signed)
°  Follow up Call-  Call back number 09/02/2020  Post procedure Call Back phone  # [phone]  Permission to leave phone message Yes  Some recent data might be hidden     Patient questions:  Do you have a fever, pain , or abdominal swelling? No. Pain Score  0 *  Have you tolerated food without any problems? Yes.    Have you been able to return to your normal activities? Yes.    Do you have any questions about your discharge instructions: Diet   No. Medications  No. Follow up visit  No.  Do you have questions or concerns about your Care? No.  Actions: * If pain score is 4 or above: No action needed, pain <4  1. Have you developed a fever since your procedure? NO  2.   Have you had an respiratory symptoms (SOB or cough) since your procedure? NO  3.   Have you tested positive for COVID 19 since your procedure NO  4.   Have you had any family members/close contacts diagnosed with the COVID 19 since your procedure?  NO   If yes to any of these questions please route to Joylene John, RN and Joella Prince, RN

## 2020-09-14 ENCOUNTER — Encounter: Payer: Self-pay | Admitting: Gastroenterology

## 2020-09-24 ENCOUNTER — Other Ambulatory Visit: Payer: Self-pay

## 2020-09-24 ENCOUNTER — Ambulatory Visit
Admission: RE | Admit: 2020-09-24 | Discharge: 2020-09-24 | Disposition: A | Discharge status: Home / Self Care | Payer: MEDICARE | Source: Ambulatory Visit | Attending: Gastroenterology | Admitting: Gastroenterology

## 2020-09-24 DIAGNOSIS — R9389 Abnormal findings on diagnostic imaging of other specified body structures: Secondary | ICD-10-CM

## 2020-09-24 MED ORDER — GADOBENATE DIMEGLUMINE 529 MG/ML IV SOLN
19.0000 mL | Freq: Once | INTRAVENOUS | Status: AC | PRN
  Administered 2020-09-24: 19 mL via INTRAVENOUS

## 2020-09-27 ENCOUNTER — Telehealth: Payer: Self-pay | Admitting: General Surgery

## 2020-09-27 NOTE — Telephone Encounter (Signed)
Contacted the patient and went over MRI with her. She had printed it out yesterday and is very concerned. I suggested that she keep her appointment with Dr Carlean Purl to come up with a treatment plan and follow up time frame of imaging. The patient verbalized understanding.

## 2020-09-27 NOTE — Telephone Encounter (Signed)
-----   Message from [place], DO sent at 09/26/2020  5:05 PM EST ----- MRI notable for the following:-2 small pancreatic cysts with the larger measuring 1.7 x 1.2 x 1.3 cm, with no definite connection to the pancreatic duct.  Possibilities include postinflammatory cystic lesions or IPMN, with recommendation for repeat MRI in 2 years-Mild prominence of lateral left back lobe suspicious for early cirrhosis-Splenomegaly-Trace perihepatic ascites-Suspected small right gastric varices and paraesophageal varices indicating portal venous hypertension-Lower thoracic and lumbar degenerative disc diseaseRecommendations:-Follow-up in GI clinic as scheduled on 10/05/2020-Will plan to discuss the role in optimal timing for repeat imaging for ongoing surveillance of the pancreatic cystic lesions-MRI findings go along with endoscopic findings portal hypertension

## 2020-10-05 ENCOUNTER — Encounter: Payer: Self-pay | Admitting: Internal Medicine

## 2020-10-05 ENCOUNTER — Ambulatory Visit (INDEPENDENT_AMBULATORY_CARE_PROVIDER_SITE_OTHER): Payer: MEDICARE | Admitting: Internal Medicine

## 2020-10-05 ENCOUNTER — Other Ambulatory Visit (INDEPENDENT_AMBULATORY_CARE_PROVIDER_SITE_OTHER): Payer: MEDICARE

## 2020-10-05 VITALS — BP 140/76 | HR 70 | Ht 66.0 in | Wt 195.0 lb

## 2020-10-05 DIAGNOSIS — K7460 Unspecified cirrhosis of liver: Secondary | ICD-10-CM

## 2020-10-05 DIAGNOSIS — I8500 Esophageal varices without bleeding: Secondary | ICD-10-CM

## 2020-10-05 DIAGNOSIS — K766 Portal hypertension: Secondary | ICD-10-CM

## 2020-10-05 DIAGNOSIS — R1013 Epigastric pain: Secondary | ICD-10-CM | POA: Diagnosis not present

## 2020-10-05 DIAGNOSIS — R188 Other ascites: Secondary | ICD-10-CM

## 2020-10-05 DIAGNOSIS — R490 Dysphonia: Secondary | ICD-10-CM

## 2020-10-05 DIAGNOSIS — K862 Cyst of pancreas: Secondary | ICD-10-CM

## 2020-10-05 LAB — CBC WITH DIFFERENTIAL/PLATELET
Basophils Absolute: 0 10*3/uL (ref 0.0–0.1)
Basophils Relative: 0.6 % (ref 0.0–3.0)
Eosinophils Absolute: 0.2 10*3/uL (ref 0.0–0.7)
Eosinophils Relative: 5.4 % — ABNORMAL HIGH (ref 0.0–5.0)
HCT: 36.5 % (ref 36.0–46.0)
Hemoglobin: 11.8 g/dL — ABNORMAL LOW (ref 12.0–15.0)
Lymphocytes Relative: 12.6 % (ref 12.0–46.0)
Lymphs Abs: 0.5 10*3/uL — ABNORMAL LOW (ref 0.7–4.0)
MCHC: 32.4 g/dL (ref 30.0–36.0)
MCV: 82.8 fl (ref 78.0–100.0)
Monocytes Absolute: 0.2 10*3/uL (ref 0.1–1.0)
Monocytes Relative: 6.4 % (ref 3.0–12.0)
Neutro Abs: 2.8 10*3/uL (ref 1.4–7.7)
Neutrophils Relative %: 75 % (ref 43.0–77.0)
Platelets: 161 10*3/uL (ref 150.0–400.0)
RBC: 4.41 Mil/uL (ref 3.87–5.11)
RDW: 15.6 % — ABNORMAL HIGH (ref 11.5–15.5)
WBC: 3.7 10*3/uL — ABNORMAL LOW (ref 4.0–10.5)

## 2020-10-05 LAB — COMPREHENSIVE METABOLIC PANEL
ALT: 21 U/L (ref 0–35)
AST: 23 U/L (ref 0–37)
Albumin: 4.3 g/dL (ref 3.5–5.2)
Alkaline Phosphatase: 88 U/L (ref 39–117)
BUN: 8 mg/dL (ref 6–23)
CO2: 28 mEq/L (ref 19–32)
Calcium: 9.8 mg/dL (ref 8.4–10.5)
Chloride: 97 mEq/L (ref 96–112)
Creatinine, Ser: 0.72 mg/dL (ref 0.40–1.20)
GFR: 78.6 mL/min (ref >60.00)
Glucose, Bld: 90 mg/dL (ref 70–99)
Potassium: 4.1 mEq/L (ref 3.5–5.1)
Sodium: 134 mEq/L — ABNORMAL LOW (ref 135–145)
Total Bilirubin: 0.9 mg/dL (ref 0.2–1.2)
Total Protein: 7.2 g/dL (ref 6.0–8.3)

## 2020-10-05 LAB — PROTIME-INR
INR: 1.2 ratio — ABNORMAL HIGH (ref 0.8–1.0)
Prothrombin Time: 13.7 s — ABNORMAL HIGH (ref 9.6–13.1)

## 2020-10-05 MED ORDER — PANTOPRAZOLE SODIUM 40 MG PO TBEC
40.0000 mg | DELAYED_RELEASE_TABLET | Freq: Two times a day (BID) | ORAL | 1 refills | Status: DC
Start: 2020-10-05 — End: 2021-01-02

## 2020-10-05 MED ORDER — NADOLOL 20 MG PO TABS: 20.0000 mg | ORAL_TABLET | Freq: Every day | ORAL | 2 refills | Status: DC

## 2020-10-05 NOTE — Patient Instructions (Addendum)
We have sent the following medications to your pharmacy for you to pick up at your convenience: Pantoprazole, Nadolol   Your provider has requested that you go to the basement level for lab work before leaving today. Press "B" on the elevator. The lab is located at the first door on the left as you exit the elevator.   Call us in a week with your BP and Pulse. Please call in AM on 10/12/2020 as we close early that day.    Please follow up with Dr Carlean Purl in a month.  We are providing you with a handout to read on Hoarseness, Esophageal Varices, Cirrhosis, and a low sodium eating plan.    I appreciate the opportunity to care for you. Silvano Rusk, MD, [facility]

## 2020-10-05 NOTE — Progress Notes (Signed)
TRANIYA PRICHETT 79 y.o. 01-12-1941 [id number]  Assessment & Plan:   Encounter Diagnoses  Name Primary?  . Cirrhosis of liver with trace ascites, unspecified hepatic cirrhosis type ([place]) Yes  . Portal hypertension (HCC)-gastropathy and colopathy   . Esophageal varices without bleeding, unspecified esophageal varices type ([place])   . Abdominal pain, epigastric   . Hoarseness   . 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  Statistically nonalcoholic steatohepatitis would be the most likely disorder.  I do not think we have signs of this before.  How this is not an unusual scenario where there is subclinical cirrhosis manifested by thrombocytopenia causing portal hypertension at times.  I explained how portal hypertension develops in the setting of cirrhosis and how the blood flow shifts and creates gastropathy varices in the esophagus and rectum  Her portal hypertension may be contributing to her recurrent anemia though she has had problems with iron deficiency anemia for years off and on it seems.  No clear cause found.  She has tested negative for celiac disease in the past.  She has some decompensation with trace ascites and the portal hypertension and varices.  Overall though functional status appears reasonably good though I explained we do not have specific interventions to reverse this disorder.  Etiology not clear will work-up with serologies.  I do not think a biopsy is necessary as I do not think it would change what we do but keep that in mind.  Her father was an alcoholic, she has been iron deficient I do not think she has hemochromatosis.   Regarding the hoarseness try twice daily pantoprazole prescription written.  She will try that.  We will see what happens with the epigastric pain back on that.  If  the hoarseness responds to the pantoprazole twice daily then we can likely reasonably conclude it was from GERD.  If not consider ENT evaluation.  Regarding the varices and portal hypertension trial of nadolol 20 mg daily.  Side effects discussed.  She takes her blood pressure at home and she is to call me with pulse and blood pressure in a week or so.  Sort out need for vaccination status with labs to check immunity to hepatitis A and B.  I doubt the pancreatic cysts are clinically significant we can consider repeat MRI in 2 years.  Patient education handouts were provided regarding cirrhosis, esophageal varices with advice about avoiding straining and lifting heavy objects, 2 g sodium diet is recommended and information provided as well.   Orders Placed This Encounter  Procedures  . ANA  . Hepatitis B surface antibody,qualitative  . Hepatitis B surface antigen  . Hepatitis A antibody, total  . Hepatitis C antibody  . Hepatitis B core antibody, total  . Alpha-1-antitrypsin  . Mitochondrial antibodies  . Anti-smooth muscle antibody, IgG  . Protime-INR  . CBC with Differential/Platelet  . Comprehensive metabolic panel   Meds ordered this encounter  Medications  . pantoprazole (PROTONIX) 40 MG tablet    Sig: Take 1 tablet (40 mg total) by mouth 2 (two) times daily.    Dispense:  90 tablet    Refill:  1  . nadolol (CORGARD) 20 MG tablet    Sig: Take 1 tablet (20 mg total) by mouth daily.    Dispense:  30 tablet  Refill:  2   Medications Discontinued During This Encounter  Medication Reason  . omeprazole (PRILOSEC OTC) 20 MG tablet   . pantoprazole (PROTONIX) 40 MG tablet Reorder  . aspirin 81 MG chewable tablet    I will see her back on November 07, 2020  I appreciate the opportunity to care for this patient. CC: Earney Mallet, MD Dr. Georgiann Mohs   Subjective:   Chief Complaint: New diagnosis of cirrhosis  HPI 79 year old white woman with recently  discovered varices, portal hypertension and cirrhosis here with her daughter (nurse practitioner) and her son for follow-up and discussion of recent results.  She has a past medical history notable for chronic anemia type 2 diabetes, GERD and remote colon cancer diagnosed in 1988.  She saw Jiraiya Mcewan Best, NP in our office due to anemia on August 04, 2020, she was status post colonoscopy by me in 2017 and had 3 tubular adenomas removed with no further routine colonoscopy recommended.  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  Her ferritin was 26 and she was sent to [place] by her oncologist.  She had been on Protonix daily for history of GERD and prior EGD in 2014 in [place] with a few sessile stomach polyps that were benign.  Note she had a history of chronic recurrent iron deficiency anemia.  Colleen appropriately arranged for an EGD and a colonoscopy that was performed by Dr. Bryan Lemma in my absence due to injury.  She also ordered an abdominal ultrasound because of the thrombocytopenia question cirrhosis and splenomegaly.  She underwent EGD and colonoscopy on September 02, 2020 with the following findings:  Gr I esophageal varices Gastritis do not store any chocolate near Gastric polyps  Hemorrhoids found on perianal exam. - Three 2 to 5 mm polyps in the transverse colon and in the ascending colon, removed with a cold snare. Resected and retrieved. - A few 1 to 2 mm polyps in the rectum, removed with a cold biopsy forceps. Resected and retrieved. - Multiple non-bleeding colonic angioectasias. - Rectal varices. - Non-bleeding internal hemorrhoids. - The examined portion of the ileum was normal.  Pathology results as below 1. Surgical [P], duodenal random - DUODENAL MUCOSA WITH NO SPECIFIC  HISTOPATHOLOGIC CHANGES - NEGATIVE FOR INCREASED INTRAEPITHELIAL LYMPHOCYTES OR VILLOUS ARCHITECTURAL CHANGES 2. Surgical [P], gastric antrum and gastric body - GASTRIC ANTRAL MUCOSA SHOWING REACTIVE GASTROPATHY WITH ECTATIC LAMINA PROPRIA CAPILLARIES, CONSISTENT WITH PORTAL GASTROPATHY - GASTRIC OXYNTIC MUCOSA WITH PARIETAL CELL HYPERPLASIA AS CAN BE SEEN IN HYPERGASTRINEMIC STATES SUCH AS PPI THERAPY. - WARTHIN STARRY STAIN IS NEGATIVE FOR HELICOBACTER PYLORI 3. Surgical [P], gastric polyps (multiple) - FUNDIC GLAND POLYP(S) - NEGATIVE FOR DYSPLASIA 4. Surgical [P], colon, ascending x 2, transverse x 1, polyp (3) - TUBULAR ADENOMA(S) - NEGATIVE FOR HIGH-GRADE DYSPLASIA OR MALIGNANCY 5. Surgical [P], colon, rectum, polyp (3) - HYPERPLASTIC POLYP(S)  Ultrasound August 15, 2020 demonstrated Splenomegaly Segmental gallbladder wall thickening 1.5 cm cystic focus in the pancreatic body  MRI of the abdomen with and without contrast was then done on 09/24/2020 there was some respiratory motion which limited exam accuracy somewhat Images are personally viewed by me as well  The gallbladder wall actually looked okay, there was mild prominence of the lateral segment of left hepatic lobe suspicious for early morphologic findings of cirrhosis  2 small pancreatic cystic lesions  were present, the larger along the upper margin of the pancreatic body without definite connection to the dorsal pancreatic duct measuring 1.7 x 1.2 x 1.3 cm  Splenomegaly 14.5 x 6.9 x 14.7 cm with a volume of 770 cm  Suspected small right gastric varices small uphill paraesophageal varices  Trace perihepatic ascites   The patient is perplexed by this diagnosis of cirrhosis.  Platelets have been low for many years and were always attributed to prior chemotherapy.   She has some fatigue.  Dr. Bryan Lemma had recommended twice daily pantoprazole but her prescription was not updated and she could not get a refill  after taking her daily prescription for twice daily.  She tried some omeprazole but that gave her diarrhea so she stopped it.  So she has been off PPI for a couple of weeks she has had some intermittent epigastric pain since then.  There is been some mild hoarseness.  Per patient and family no significant confusion.  I.e. no signs of hepatic encephalopathy.   Father died at 14 from cirrhosis but he was an alcoholic.    Allergies  Allergen Reactions  . Latex Anaphylaxis  . Codeine Palpitations   Current Meds  Medication Sig  . ascorbic acid (VITAMIN C) 500 MG tablet Take by mouth.  . Biotin 1000 MCG tablet Take by mouth.  . Cholecalciferol 50 MCG (2000 UT) TABS Take 2,000 Units by mouth.   . diphenhydrAMINE-APAP, sleep, (TYLENOL PM EXTRA STRENGTH PO) Take by mouth.  Marland Kitchen KRILL OIL PO Take by mouth.  . levothyroxine (SYNTHROID) 75 MCG tablet Take 75 mcg by mouth daily before breakfast.   . magnesium 30 MG tablet Take 30 mg by mouth at bedtime.  . Multiple Vitamins-Minerals (ZINC PO) Take by mouth.  . pramipexole (MIRAPEX) 0.5 MG tablet Take 0.5 mg by mouth at bedtime.  . vitamin B-12 (CYANOCOBALAMIN) 1000 MCG tablet Take 1,000 mcg by mouth daily.  Marland Kitchen zinc gluconate 50 MG tablet Take 50 mg by mouth daily.  . [DISCONTINUED] aspirin 81 MG chewable tablet Chew by mouth.  . [DISCONTINUED] omeprazole (PRILOSEC OTC) 20 MG tablet Take 20 mg by mouth daily.   Past Medical History:  Diagnosis Date  . Anemia   . Colon cancer ([place]) 1988  . Diabetes ([place])   . Esophageal varices without bleeding ([place]) 10/09/2020  . GERD (gastroesophageal reflux disease)   . Iron deficiency anemia 12/24/2012  . Pancreatic cysts 10/09/2020  . Portal hypertension (HCC)-gastropathy and colopathy 10/09/2020  . Sleep apnea   . Thyroid disease    Past Surgical History:  Procedure Laterality Date  . ABDOMINAL HYSTERECTOMY  1987  . COLON RESECTION    . COLONOSCOPY    . PORTACATH PLACEMENT    . SHOULDER ARTHROSCOPY      left  . TOTAL KNEE ARTHROPLASTY Left 06/08/2019   Procedure: TOTAL KNEE ARTHROPLASTY;  Surgeon: Gaynelle Arabian, MD;  Location: [facility];  Service: Orthopedics;  Laterality: Left;  33min  . UPPER GASTROINTESTINAL ENDOSCOPY     Social History   Social History Narrative   Married, 1 son one daughter. Daughter is a Designer, jewellery.   She is retired   2 cups coffee per day   family history includes Alzheimer's disease in her mother; Bone cancer in her brother and brother; Diabetes in her brother and mother; Heart attack in her brother; Heart disease in her brother; Lung cancer in her brother, brother, and sister; Prostate cancer in her brother; Ulcerative colitis in her sister.  Review of Systems As per HPI  Objective:   Physical Exam BP 140/76   Pulse 70   Ht 5\' 6"  (1.676 m)   Wt 195 lb (88.5 kg)   BMI 31.47 kg/m   Elderly white woman in no acute distress Alert and oriented x3, no asterixis Lungs are clear Heart sounds are normal The abdomen is soft and nontender without organomegaly or mass I do not see any stigmata of chronic liver disease like spider angiomata prominent abdominal wall veins   50 minutes total time

## 2020-10-09 ENCOUNTER — Encounter: Payer: Self-pay | Admitting: Internal Medicine

## 2020-10-09 DIAGNOSIS — I8500 Esophageal varices without bleeding: Secondary | ICD-10-CM

## 2020-10-09 DIAGNOSIS — K7460 Unspecified cirrhosis of liver: Secondary | ICD-10-CM

## 2020-10-09 DIAGNOSIS — K766 Portal hypertension: Secondary | ICD-10-CM

## 2020-10-09 DIAGNOSIS — R490 Dysphonia: Secondary | ICD-10-CM | POA: Insufficient documentation

## 2020-10-09 DIAGNOSIS — K862 Cyst of pancreas: Secondary | ICD-10-CM

## 2020-10-09 DIAGNOSIS — K7581 Nonalcoholic steatohepatitis (NASH): Secondary | ICD-10-CM

## 2020-10-09 HISTORY — DX: Esophageal varices without bleeding: I85.00

## 2020-10-09 HISTORY — DX: Portal hypertension: K76.6

## 2020-10-09 HISTORY — DX: Nonalcoholic steatohepatitis (NASH): K75.81

## 2020-10-09 HISTORY — DX: Unspecified cirrhosis of liver: K74.60

## 2020-10-09 HISTORY — DX: Cyst of pancreas: K86.2

## 2020-10-09 LAB — HEPATITIS C ANTIBODY
Hepatitis C Ab: NONREACTIVE
SIGNAL TO CUT-OFF: 0.01 (ref <1.00)

## 2020-10-09 LAB — ALPHA-1-ANTITRYPSIN: A-1 Antitrypsin, Ser: 165 mg/dL (ref 83–199)

## 2020-10-09 LAB — MITOCHONDRIAL ANTIBODIES: Mitochondrial M2 Ab, IgG: <=20 U

## 2020-10-09 LAB — ANTI-SMOOTH MUSCLE ANTIBODY, IGG: Actin (Smooth Muscle) Antibody (IGG): <20 U (ref <20)

## 2020-10-09 LAB — HEPATITIS B CORE ANTIBODY, TOTAL: Hep B Core Total Ab: NONREACTIVE

## 2020-10-09 LAB — HEPATITIS A ANTIBODY, TOTAL: Hepatitis A AB,Total: REACTIVE — AB

## 2020-10-09 LAB — HEPATITIS B SURFACE ANTIBODY,QUALITATIVE: Hep B S Ab: NONREACTIVE

## 2020-10-09 LAB — HEPATITIS B SURFACE ANTIGEN: Hepatitis B Surface Ag: NONREACTIVE

## 2020-10-09 LAB — ANA: Anti Nuclear Antibody (ANA): NEGATIVE

## 2020-11-07 ENCOUNTER — Ambulatory Visit (INDEPENDENT_AMBULATORY_CARE_PROVIDER_SITE_OTHER): Payer: MEDICARE | Admitting: Internal Medicine

## 2020-11-07 ENCOUNTER — Encounter: Payer: Self-pay | Admitting: Internal Medicine

## 2020-11-07 VITALS — BP 140/70 | HR 96 | Ht 66.0 in | Wt 195.6 lb

## 2020-11-07 DIAGNOSIS — K862 Cyst of pancreas: Secondary | ICD-10-CM

## 2020-11-07 DIAGNOSIS — D500 Iron deficiency anemia secondary to blood loss (chronic): Secondary | ICD-10-CM

## 2020-11-07 DIAGNOSIS — K7581 Nonalcoholic steatohepatitis (NASH): Secondary | ICD-10-CM

## 2020-11-07 DIAGNOSIS — K7460 Unspecified cirrhosis of liver: Secondary | ICD-10-CM

## 2020-11-07 DIAGNOSIS — K766 Portal hypertension: Secondary | ICD-10-CM

## 2020-11-07 DIAGNOSIS — E669 Obesity, unspecified: Secondary | ICD-10-CM

## 2020-11-07 DIAGNOSIS — E8881 Metabolic syndrome: Secondary | ICD-10-CM

## 2020-11-07 DIAGNOSIS — R490 Dysphonia: Secondary | ICD-10-CM | POA: Diagnosis not present

## 2020-11-07 DIAGNOSIS — I8510 Secondary esophageal varices without bleeding: Secondary | ICD-10-CM

## 2020-11-07 MED ORDER — HEPATITIS B VAC RECOMBINANT 5 MCG/0.5ML IJ SUSP: INTRAMUSCULAR | 2 refills | Status: DC

## 2020-11-07 NOTE — Progress Notes (Signed)
Cristina Santos 79 y.o. 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  Work on breakfast first.  [email or web address]. Careful monitoring for any illness infection is the best way to avoid deterioration. rtc late Feb  Pancreatic cysts F/u MRI 2 yrs  Portal hypertension (HCC)-gastropathy and colopathy  Nadolol - stay at current dose due to fatigue and #  Esophageal varices without bleeding (HCC) Nadolol - stay at current dose due to fatigue and #  Iron deficiency anemia Stable.  Continue iron supplementation.  Hoarseness Take twice daily PPI for 3 months.  If she is better at that point continue until she sees me in February, if she is not any better reduce to once a day.     Subjective:   Chief Complaint: Follow-up of cirrhosis  HPI Cristina Santos is here for follow-up of her recently diagnosed cirrhosis and chronic hoarseness.  Recall that she had EGD and colonoscopy for iron deficiency anemia recently and had esophageal varices and portal gastropathy.  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  She was seen November 17 accompanied by her son and daughter Doctor, hospital from [place]).  At that point I started her on nadolol 20 mg daily for prophylaxis of esophageal varices and portal hypertension, because I was considering the possibility that she had some blood loss contributing to iron deficiency related to these problems and the portal colopathy.  She is tolerating that though she is taking it at bedtime.  See blood pressures and pulses she has recorded attached by image.  This is in the physical exam objective section.  She takes it at bedtime so she is not fatigue during the day.  She did run out  of it a few days ago.  Serologic work on that November 17 appointment was negative.  She is nave to hepatitis B.  Immune to hepatitis A.  I reviewed multiple questions that she had.  She asked what she could do to help prevent complications from her liver disease.  We reviewed her diet she does eat grains and cereals particularly things like instant oatmeal and cereal for breakfast.  She brought labs taken at primary care with Earney Mallet, MD   Hoarseness is about the same after 6 weeks on twice daily PPI. Marland Kitchen  Hemoglobin 11.6 MCV 81 this is stable with November 17.  Platelets 115.  Interestingly they were normal when I checked them last.  A1c is 6.2.  Creatinine 0.88 BUN 11 GFR 62.  Sodium 133 potassium 5.1.  Bilirubin 1.8 alk phos 92 AST ALT normal albumin 4.1.  UA negative TSH normal T4 normal ferritin 48 Allergies  Allergen Reactions  . Latex Anaphylaxis  . Codeine Palpitations   Current Meds  Medication Sig  . ascorbic acid (VITAMIN C) 500 MG tablet Take by mouth.  . Biotin 1000 MCG tablet Take by mouth.  . Cholecalciferol 50 MCG (2000 UT) TABS Take 2,000 Units by mouth.   . diphenhydrAMINE-APAP, sleep, (TYLENOL PM EXTRA STRENGTH PO) Take by mouth.  Marland Kitchen KRILL OIL PO Take by mouth.  . levothyroxine (SYNTHROID) 75 MCG tablet Take 75 mcg by mouth daily before breakfast.   . magnesium 30 MG tablet Take 30 mg by mouth at  bedtime.  . Multiple Vitamins-Minerals (ZINC PO) Take by mouth.  . nadolol (CORGARD) 20 MG tablet Take 1 tablet (20 mg total) by mouth daily.  . pantoprazole (PROTONIX) 40 MG tablet Take 1 tablet (40 mg total) by mouth 2 (two) times daily.  . pramipexole (MIRAPEX) 0.5 MG tablet Take 0.5 mg by mouth at bedtime.  . vitamin B-12 (CYANOCOBALAMIN) 1000 MCG tablet Take 1,000 mcg by mouth daily.  Marland Kitchen zinc gluconate 50 MG tablet Take 50 mg by mouth daily.   Past Medical History:  Diagnosis Date  . Anemia   . Cirrhosis of liver with trace ascites ([place]) 10/09/2020  . Colon  cancer ([place]) 1988  . Diabetes ([place])   . Esophageal varices without bleeding ([place]) 10/09/2020  . GERD (gastroesophageal reflux disease)   . Iron deficiency anemia 12/24/2012  . 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 Pancreatic cysts 10/09/2020  . Portal hypertension (HCC)-gastropathy and colopathy 10/09/2020  . Sleep apnea   . Thyroid disease    Past Surgical History:  Procedure Laterality Date  . ABDOMINAL HYSTERECTOMY  1987  . COLON RESECTION    . COLONOSCOPY    . PORTACATH PLACEMENT     Never removed placed at time of colon cancer treatment  . SHOULDER ARTHROSCOPY     left  . TOTAL KNEE ARTHROPLASTY Left 06/08/2019   Procedure: TOTAL KNEE ARTHROPLASTY;  Surgeon: Gaynelle Arabian, MD;  Location: [facility];  Service: Orthopedics;  Laterality: Left;  58mn  . UPPER GASTROINTESTINAL ENDOSCOPY     Social History   Social History Narrative   Married, 1 son one daughter. Daughter is a nDesigner, jewellery   She is retired   2 cups coffee per day   family history includes Alzheimer's disease in her mother; Bone cancer in her brother and brother; Diabetes in her brother and mother; Heart attack in her brother; Heart disease in her brother; Lung cancer in her brother, brother, and sister; Prostate cancer in her brother; Ulcerative colitis in her sister.   Review of Systems   Objective:   Physical Exam BP 140/70   Pulse 96   Ht 5' 6"  (1.676 m)   Wt 195 lb 9.6 oz (88.7 kg)   BMI 31.57 kg/m

## 2020-11-07 NOTE — Assessment & Plan Note (Signed)
Nadolol - stay at current dose due to fatigue and #

## 2020-11-07 NOTE — Patient Instructions (Signed)
If the hoarseness is not better by mid January go to one pantoprazole daily. If it is better stay on the twice daily dosage.   We are providing you with a Hepatitis B vaccine order to take to the pharmacy.  We will see you again in February.  Try and cut out breads, pasta, cereal's to help decrease carbs.  Therapist, [email or web address] for more information.   I appreciate the opportunity to care for you. Silvano Rusk, MD, [facility]

## 2020-11-07 NOTE — Assessment & Plan Note (Addendum)
She is a Child's A 6 points HBV vaccine at home wgt loss trying to lose belly fat by eliminating some carbs grains etc.  Work on breakfast first.  [email or web address]. Careful monitoring for any illness infection is the best way to avoid deterioration. rtc late Feb

## 2020-11-07 NOTE — Assessment & Plan Note (Signed)
F/u MRI 2 yrs

## 2020-11-08 ENCOUNTER — Encounter: Payer: Self-pay | Admitting: Internal Medicine

## 2020-11-08 NOTE — Assessment & Plan Note (Signed)
Take twice daily PPI for 3 months.  If she is better at that point continue until she sees me in February, if she is not any better reduce to once a day.

## 2020-11-08 NOTE — Assessment & Plan Note (Signed)
Stable. Continue iron supplementation. 

## 2021-01-02 ENCOUNTER — Encounter: Payer: Self-pay | Admitting: Internal Medicine

## 2021-01-02 ENCOUNTER — Ambulatory Visit (INDEPENDENT_AMBULATORY_CARE_PROVIDER_SITE_OTHER): Payer: MEDICARE | Admitting: Internal Medicine

## 2021-01-02 ENCOUNTER — Ambulatory Visit: Payer: MEDICARE | Admitting: Internal Medicine

## 2021-01-02 ENCOUNTER — Other Ambulatory Visit: Payer: Self-pay

## 2021-01-02 ENCOUNTER — Other Ambulatory Visit (INDEPENDENT_AMBULATORY_CARE_PROVIDER_SITE_OTHER): Payer: MEDICARE

## 2021-01-02 VITALS — BP 120/68 | HR 65 | Ht 66.0 in | Wt 191.6 lb

## 2021-01-02 DIAGNOSIS — K7581 Nonalcoholic steatohepatitis (NASH): Secondary | ICD-10-CM

## 2021-01-02 DIAGNOSIS — K7460 Unspecified cirrhosis of liver: Secondary | ICD-10-CM | POA: Diagnosis not present

## 2021-01-02 DIAGNOSIS — K766 Portal hypertension: Secondary | ICD-10-CM

## 2021-01-02 DIAGNOSIS — D500 Iron deficiency anemia secondary to blood loss (chronic): Secondary | ICD-10-CM

## 2021-01-02 DIAGNOSIS — R1013 Epigastric pain: Secondary | ICD-10-CM

## 2021-01-02 DIAGNOSIS — R490 Dysphonia: Secondary | ICD-10-CM

## 2021-01-02 DIAGNOSIS — K7469 Other cirrhosis of liver: Secondary | ICD-10-CM

## 2021-01-02 DIAGNOSIS — E669 Obesity, unspecified: Secondary | ICD-10-CM

## 2021-01-02 DIAGNOSIS — E8881 Metabolic syndrome: Secondary | ICD-10-CM

## 2021-01-02 LAB — COMPREHENSIVE METABOLIC PANEL
ALT: 17 U/L (ref 0–35)
AST: 18 U/L (ref 0–37)
Albumin: 4.2 g/dL (ref 3.5–5.2)
Alkaline Phosphatase: 73 U/L (ref 39–117)
BUN: 15 mg/dL (ref 6–23)
CO2: 28 mEq/L (ref 19–32)
Calcium: 9.8 mg/dL (ref 8.4–10.5)
Chloride: 98 mEq/L (ref 96–112)
Creatinine, Ser: 0.81 mg/dL (ref 0.40–1.20)
GFR: 68.12 mL/min (ref >60.00)
Glucose, Bld: 92 mg/dL (ref 70–99)
Potassium: 4.8 mEq/L (ref 3.5–5.1)
Sodium: 133 mEq/L — ABNORMAL LOW (ref 135–145)
Total Bilirubin: 1.2 mg/dL (ref 0.2–1.2)
Total Protein: 7 g/dL (ref 6.0–8.3)

## 2021-01-02 LAB — PROTIME-INR
INR: 1.2 ratio — ABNORMAL HIGH (ref 0.8–1.0)
Prothrombin Time: 13.2 s — ABNORMAL HIGH (ref 9.6–13.1)

## 2021-01-02 LAB — CBC WITH DIFFERENTIAL/PLATELET
Basophils Absolute: 0 10*3/uL (ref 0.0–0.1)
Basophils Relative: 0.5 % (ref 0.0–3.0)
Eosinophils Absolute: 0.2 10*3/uL (ref 0.0–0.7)
Eosinophils Relative: 6 % — ABNORMAL HIGH (ref 0.0–5.0)
HCT: 37.5 % (ref 36.0–46.0)
Hemoglobin: 12.4 g/dL (ref 12.0–15.0)
Lymphocytes Relative: 17.1 % (ref 12.0–46.0)
Lymphs Abs: 0.7 10*3/uL (ref 0.7–4.0)
MCHC: 33.1 g/dL (ref 30.0–36.0)
MCV: 82.5 fl (ref 78.0–100.0)
Monocytes Absolute: 0.3 10*3/uL (ref 0.1–1.0)
Monocytes Relative: 8.3 % (ref 3.0–12.0)
Neutro Abs: 2.8 10*3/uL (ref 1.4–7.7)
Neutrophils Relative %: 68.1 % (ref 43.0–77.0)
Platelets: 136 10*3/uL — ABNORMAL LOW (ref 150.0–400.0)
RBC: 4.54 Mil/uL (ref 3.87–5.11)
RDW: 16 % — ABNORMAL HIGH (ref 11.5–15.5)
WBC: 4.1 10*3/uL (ref 4.0–10.5)

## 2021-01-02 LAB — FERRITIN: Ferritin: 26.9 ng/mL (ref 10.0–291.0)

## 2021-01-02 MED ORDER — NADOLOL 20 MG PO TABS: 20.0000 mg | ORAL_TABLET | Freq: Every day | ORAL | 1 refills | Status: DC

## 2021-01-02 NOTE — Patient Instructions (Addendum)
Normal BMI (Body Mass Index- based on height and weight) is between 23 and 30. Your BMI today is Body mass index is 30.93 kg/m. Marland Kitchen Please consider follow up  regarding your BMI with your Primary Care Provider.  Your provider has requested that you go to the basement level for lab work before leaving today. Press "B" on the elevator. The lab is located at the first door on the left as you exit the elevator.  We have sent the following medications to your pharmacy for you to pick up at your convenience: Rosendo Gros  Due to recent changes in healthcare laws, you may see the results of your imaging and laboratory studies on MyChart before your provider has had a chance to review them.  We understand that in some cases there may be results that are confusing or concerning to you. Not all laboratory results come back in the same time frame and the provider may be waiting for multiple results in order to interpret others.  Please give [place] 48 hours in order for your provider to thoroughly review all the results before contacting the office for clarification of your results.   Continue mylanta and continue trying to cut down on raw vegetables.  I appreciate the opportunity to care for you. Silvano Rusk, MD, [facility]

## 2021-01-02 NOTE — Progress Notes (Signed)
Cristina Santos 80 y.o. 18-Jun-1941 [id number]  Assessment & Plan:   Encounter Diagnoses  Name Primary?  . 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 Hoarseness   . Portal hypertension (HCC)-gastropathy and colopathy    . Abdominal obesity and metabolic syndrome   . Abdominal pain, epigastric   . 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  Continue current medications see me in May.  Continue efforts at diet changes and weight changes.  Be careful with the roughage and try to have more cooked vegetables may have less upper abdominal pain.  Certainly okay to use as needed Mylanta. Orders Placed This Encounter  Procedures  . CBC with Differential/Platelet  . Comprehensive metabolic panel  . Protime-INR  . Ferritin     Meds ordered this encounter  Medications  . nadolol (CORGARD) 20 MG tablet    Sig: Take 1 tablet (20 mg total) by mouth daily.    Dispense:  90 tablet    Refill:  1     I appreciate the opportunity to care for this patient. CC: Laurena Slimmer, MD    Subjective:   Chief Complaint: Follow-up of cirrhosis  HPI Cristina Santos is an 80 year old white woman diagnosed with cirrhosis from fatty liver disease last year, presenting with her daughter Cristina Santos who is an NP in [place].  She is here for follow-up after having been seen in December.  Since that time she has been working to try to lose some weight and has been successful though is disappointed that she has not lost more.  She is trying to change her diet and she has been eating more fresh vegetables and salads.  She has been having discomfort in the epigastrium in the evenings and at night about 4 times a week with bloating and distention and a dull pain that can make her quite miserable and disrupt sleep.  Her daughter saw her yesterday with that and said she had a soft benign  abdomen.  Pepto-Bismol has provided some relief for this but that constipates her at times so she does not really want to use that much.  Last night she preemptively took some Mylanta and felt like that allowed her to tolerate things.  She has not noted it overall increase in abdominal girth nor is there edema.  She is otherwise stable with respect to her liver disease as far as we know.  She is currently off CPAP because of a recall of her equipment and waiting for a new device.  Hoarseness which was treated with twice daily PPI is better though not gone she is now down to once daily pantoprazole.  Wt Readings from Last 3 Encounters:  01/02/21 191 lb 9.6 oz (86.9 kg)  11/07/20 195 lb 9.6 oz (88.7 kg)  10/05/20 195 lb (88.5 kg)    Allergies  Allergen Reactions  . Latex Anaphylaxis  . Codeine Palpitations   Current Meds  Medication Sig  . ascorbic acid (VITAMIN C) 500 MG tablet Take 500 mg by mouth daily.  . Biotin 1000 MCG tablet Take 1,000 mcg by mouth daily.  . Cholecalciferol 50 MCG (2000 UT) TABS Take 2,000 Units by mouth daily.  . diphenhydrAMINE-APAP, sleep, (TYLENOL PM EXTRA STRENGTH PO) Take 1 tablet by mouth as needed.  Marland Kitchen KRILL OIL PO Take 1 tablet by mouth daily.  Marland Kitchen levothyroxine (SYNTHROID) 75  MCG tablet Take 75 mcg by mouth daily before breakfast.   . magnesium 30 MG tablet Take 30 mg by mouth at bedtime.  . nadolol (CORGARD) 20 MG tablet Take 1 tablet (20 mg total) by mouth daily.  . pantoprazole (PROTONIX) 40 MG tablet Take 40 mg by mouth daily.  . pramipexole (MIRAPEX) 0.5 MG tablet Take 0.5 mg by mouth at bedtime.  . vitamin B-12 (CYANOCOBALAMIN) 1000 MCG tablet Take 1,000 mcg by mouth daily.  Marland Kitchen zinc gluconate 50 MG tablet Take 50 mg by mouth daily.   Past Medical History:  Diagnosis Date  . Anemia   . Cirrhosis of liver with trace ascites ([place]) 10/09/2020  . Colon cancer ([place]) 1988  . Diabetes ([place])   . Esophageal varices without bleeding ([place]) 10/09/2020  . GERD  (gastroesophageal reflux disease)   . Iron deficiency anemia 12/24/2012  . 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 Pancreatic cysts 10/09/2020  . Portal hypertension (HCC)-gastropathy and colopathy 10/09/2020  . Sleep apnea   . Thyroid disease    Past Surgical History:  Procedure Laterality Date  . ABDOMINAL HYSTERECTOMY  1987  . COLON RESECTION    . COLONOSCOPY    . PORTACATH PLACEMENT     Never removed placed at time of colon cancer treatment  . SHOULDER ARTHROSCOPY     left  . TOTAL KNEE ARTHROPLASTY Left 06/08/2019   Procedure: TOTAL KNEE ARTHROPLASTY;  Surgeon: Gaynelle Arabian, MD;  Location: [facility];  Service: Orthopedics;  Laterality: Left;  7min  . UPPER GASTROINTESTINAL ENDOSCOPY     Social History   Social History Narrative   Married, 1 son one daughter. Daughter is a Designer, jewellery.   She is retired   2 cups coffee per day   family history includes Alzheimer's disease in her mother; Bone cancer in her brother and brother; Diabetes in her brother and mother; Heart attack in her brother; Heart disease in her brother; Lung cancer in her brother, brother, and sister; Prostate cancer in her brother; Ulcerative colitis in her sister.   Review of Systems As per HPI Objective:   Physical Exam BP 120/68   Pulse 65   Ht 5\' 6"  (1.676 m)   Wt 191 lb 9.6 oz (86.9 kg)   SpO2 99%   BMI 30.93 kg/m  Elderly NAD Anicteric Lungs cta Cor NL abd obese soft NT no clear ascites Alert and oriented

## 2021-01-02 NOTE — Assessment & Plan Note (Signed)
Labs today.  Continue current medications see me in May.  Continue efforts at diet changes and weight changes.  Be careful with the roughage and try to have more cooked vegetables may have less upper abdominal pain.  Certainly okay to use as needed Mylanta. Orders Placed This Encounter  Procedures  . CBC with Differential/Platelet  . Comprehensive metabolic panel  . Protime-INR  . Ferritin

## 2021-01-03 ENCOUNTER — Other Ambulatory Visit: Payer: Self-pay | Admitting: Internal Medicine

## 2021-03-21 ENCOUNTER — Other Ambulatory Visit (INDEPENDENT_AMBULATORY_CARE_PROVIDER_SITE_OTHER): Payer: MEDICARE

## 2021-03-21 ENCOUNTER — Ambulatory Visit (INDEPENDENT_AMBULATORY_CARE_PROVIDER_SITE_OTHER): Payer: MEDICARE | Admitting: Internal Medicine

## 2021-03-21 ENCOUNTER — Encounter: Payer: Self-pay | Admitting: Internal Medicine

## 2021-03-21 VITALS — BP 150/60 | HR 64 | Ht 64.6 in | Wt 193.0 lb

## 2021-03-21 DIAGNOSIS — K862 Cyst of pancreas: Secondary | ICD-10-CM | POA: Diagnosis not present

## 2021-03-21 DIAGNOSIS — I8510 Secondary esophageal varices without bleeding: Secondary | ICD-10-CM

## 2021-03-21 DIAGNOSIS — R1013 Epigastric pain: Secondary | ICD-10-CM

## 2021-03-21 DIAGNOSIS — K7581 Nonalcoholic steatohepatitis (NASH): Secondary | ICD-10-CM | POA: Diagnosis not present

## 2021-03-21 DIAGNOSIS — D500 Iron deficiency anemia secondary to blood loss (chronic): Secondary | ICD-10-CM

## 2021-03-21 DIAGNOSIS — K766 Portal hypertension: Secondary | ICD-10-CM

## 2021-03-21 DIAGNOSIS — K7460 Unspecified cirrhosis of liver: Secondary | ICD-10-CM

## 2021-03-21 LAB — PROTIME-INR
INR: 1.2 ratio — ABNORMAL HIGH (ref 0.8–1.0)
Prothrombin Time: 13 s (ref 9.6–13.1)

## 2021-03-21 LAB — CBC WITH DIFFERENTIAL/PLATELET
Basophils Absolute: 0 10*3/uL (ref 0.0–0.1)
Basophils Relative: 0.7 % (ref 0.0–3.0)
Eosinophils Absolute: 0.2 10*3/uL (ref 0.0–0.7)
Eosinophils Relative: 4.9 % (ref 0.0–5.0)
HCT: 37.4 % (ref 36.0–46.0)
Hemoglobin: 12.4 g/dL (ref 12.0–15.0)
Lymphocytes Relative: 20.5 % (ref 12.0–46.0)
Lymphs Abs: 0.9 10*3/uL (ref 0.7–4.0)
MCHC: 33.1 g/dL (ref 30.0–36.0)
MCV: 85.2 fl (ref 78.0–100.0)
Monocytes Absolute: 0.3 10*3/uL (ref 0.1–1.0)
Monocytes Relative: 6.5 % (ref 3.0–12.0)
Neutro Abs: 2.9 10*3/uL (ref 1.4–7.7)
Neutrophils Relative %: 67.4 % (ref 43.0–77.0)
Platelets: 134 10*3/uL — ABNORMAL LOW (ref 150.0–400.0)
RBC: 4.39 Mil/uL (ref 3.87–5.11)
RDW: 15.3 % (ref 11.5–15.5)
WBC: 4.2 10*3/uL (ref 4.0–10.5)

## 2021-03-21 LAB — COMPREHENSIVE METABOLIC PANEL
ALT: 21 U/L (ref 0–35)
AST: 20 U/L (ref 0–37)
Albumin: 4.4 g/dL (ref 3.5–5.2)
Alkaline Phosphatase: 81 U/L (ref 39–117)
BUN: 13 mg/dL (ref 6–23)
CO2: 24 mEq/L (ref 19–32)
Calcium: 9.9 mg/dL (ref 8.4–10.5)
Chloride: 99 mEq/L (ref 96–112)
Creatinine, Ser: 0.93 mg/dL (ref 0.40–1.20)
GFR: 57.63 mL/min — ABNORMAL LOW (ref >60.00)
Glucose, Bld: 103 mg/dL — ABNORMAL HIGH (ref 70–99)
Potassium: 4.9 mEq/L (ref 3.5–5.1)
Sodium: 133 mEq/L — ABNORMAL LOW (ref 135–145)
Total Bilirubin: 0.9 mg/dL (ref 0.2–1.2)
Total Protein: 7 g/dL (ref 6.0–8.3)

## 2021-03-21 LAB — AMMONIA: Ammonia: 61 umol/L — ABNORMAL HIGH (ref 11–35)

## 2021-03-21 NOTE — Patient Instructions (Signed)
If you are age 82 or older, your body mass index should be between 23-30. Your Body mass index is 32.52 kg/m. If this is out of the aforementioned range listed, please consider follow up with your Primary Care Provider.  Your provider has requested that you go to the basement level for lab work before leaving today. Press "B" on the elevator. The lab is located at the first door on the left as you exit the elevator.  You have been scheduled for an MRI at [facility] on 04/01/2021. Your appointment time is 1:40 pm. Please arrive 15 minutes prior to your appointment time for registration purposes. Please make certain not to have anything to eat or drink 4 hours prior to your test. In addition, if you have any metal in your body, have a pacemaker or defibrillator, please be sure to let your ordering physician know. This test typically takes 45 minutes to 1 hour to complete. Should you need to reschedule, please call [phone] to do so.  We will call you with the results of your CT and labs to discuss a follow up.

## 2021-03-21 NOTE — Progress Notes (Addendum)
Cristina Santos 80 y.o. 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  I am not sure where this epigastric pain is coming from.  Repeat MRI mainly to look at the pancreas but also certainly at risk of liver cancer so we will see what the liver looks like on MRI again as well.  She does not show overt hepatic encephalopathy but with her fatigue though that could be from anemia could be from encephalopathy.  Orders Placed This Encounter  Procedures  . MR Abdomen W Wo Contrast  . CBC w/Diff  . Comprehensive metabolic panel  . Ammonia  . INR/PT     Esophageal varices without bleeding (HCC) Continue nadolol  Portal hypertension (HCC)-gastropathy and colopathy  Continue nadolol  Pancreatic cysts Repeat MRI  Iron deficiency anemia Multifactorial she has colonic AVMs portal hypertensive changes upper and lower.  Recheck CBC.  Fatigue could be from nadolol could be from anemia could be from poor sleep (see if new CPAP helps) consider the possibility of low-grade hepatic encephalopathy  CC: Madan, Ankit, MD  Subjective:   Chief Complaint: Cirrhosis and abdominal pain  HPI Cristina Santos is an 80 year old white woman with liver cirrhosis secondary to NASH with trace ascites and 1+ varices who is here for routine follow-up.  She also has some pancreatic cystic lesions seen on MRI last fall.  She continues with intermittent epigastric and upper abdominal pain of unclear etiology.  She had some inflammatory changes and EGD in October but these were not borne out by biopsy.  She is on a PPI but still has these problems.  Its not necessarily related to eating and it is bothersome and can awaken her at night.  She saw her oncologist recently in follow-up and he is concerned that perhaps she could have pancreatic cancer.  She has been fatigued at times.  She is tolerating her nadolol.  She did report that she got  a new CPAP machine after not having 1 for about a year she has been on that for a few weeks.     Wt Readings from Last 3 Encounters:  03/21/21 193 lb (87.5 kg)  01/02/21 191 lb 9.6 oz (86.9 kg)  11/07/20 195 lb 9.6 oz (88.7 kg)    Allergies  Allergen Reactions  . Latex Anaphylaxis  . Codeine Palpitations   Current Meds  Medication Sig  . ascorbic acid (VITAMIN C) 500 MG tablet Take 500 mg by mouth daily.  . Biotin 1000 MCG tablet Take 1,000 mcg by mouth daily.  . Cholecalciferol 50 MCG (2000 UT) TABS Take 2,000 Units by mouth daily.  . diphenhydrAMINE-APAP, sleep, (TYLENOL PM EXTRA STRENGTH PO) Take 1 tablet by mouth as needed.  Marland Kitchen KRILL OIL PO Take 1 tablet by mouth daily.  Marland Kitchen levothyroxine (SYNTHROID) 75 MCG tablet Take 75 mcg by mouth daily before breakfast.   . magnesium 30 MG tablet Take 30 mg by mouth at bedtime.  . nadolol (CORGARD) 20 MG tablet Take 1 tablet (20 mg total) by mouth daily.  . pantoprazole (PROTONIX) 40 MG tablet Take 40 mg by mouth daily.  . pramipexole (MIRAPEX) 0.5 MG tablet Take 0.5 mg by mouth at bedtime.  . vitamin B-12 (CYANOCOBALAMIN) 1000 MCG tablet Take 1,000 mcg by mouth daily.   Past Medical History:  Diagnosis Date  . Anemia   . Cirrhosis of liver with trace ascites ([place]) 10/09/2020  . Colon cancer ([place])  1988  . Diabetes ([place])   . Esophageal varices without bleeding ([place]) 10/09/2020  . GERD (gastroesophageal reflux disease)   . Iron deficiency anemia 12/24/2012  . 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 Pancreatic cysts 10/09/2020  . Portal hypertension (HCC)-gastropathy and colopathy 10/09/2020  . Sleep apnea   . Thyroid disease    Past Surgical History:  Procedure Laterality Date  . ABDOMINAL HYSTERECTOMY  1987  . COLON RESECTION    . COLONOSCOPY    . PORTACATH PLACEMENT     Never removed placed at time of colon cancer treatment  . SHOULDER ARTHROSCOPY     left  . TOTAL KNEE ARTHROPLASTY  Left 06/08/2019   Procedure: TOTAL KNEE ARTHROPLASTY;  Surgeon: Gaynelle Arabian, MD;  Location: [facility];  Service: Orthopedics;  Laterality: Left;  54min  . UPPER GASTROINTESTINAL ENDOSCOPY     Social History   Social History Narrative   Married, 1 son one daughter. Daughter is a Designer, jewellery.   She is retired   2 cups coffee per day   family history includes Alzheimer's disease in her mother; Bone cancer in her brother and brother; Diabetes in her brother and mother; Heart attack in her brother; Heart disease in her brother; Lung cancer in her brother, brother, and sister; Prostate cancer in her brother; Ulcerative colitis in her sister.   Review of Systems As per HPI  Objective:   Physical Exam BP (!) 150/60 (BP Location: Left Arm, Patient Position: Sitting, Cuff Size: Normal)   Pulse 64   Ht 5' 4.6" (1.641 m) Comment: height measured without shoes  Wt 193 lb (87.5 kg)   BMI 32.52 kg/m  Eyes anicteric Lungs cta Cor NL s1s2 no rmg abd obese soft w/tenderness mild in epigastrium no clear HSM Ext tr ankle edema Alert and oriented x3 no asterixis

## 2021-03-21 NOTE — Assessment & Plan Note (Addendum)
Seems stable overall.  I am not sure where this epigastric pain is coming from.  Repeat MRI mainly to look at the pancreas but also certainly at risk of liver cancer so we will see what the liver looks like on MRI again as well.  She does not show overt hepatic encephalopathy but with her fatigue though that could be from anemia could be from encephalopathy.  Orders Placed This Encounter  Procedures  . MR Abdomen W Wo Contrast  . CBC w/Diff  . Comprehensive metabolic panel  . Ammonia  . INR/PT

## 2021-03-21 NOTE — Assessment & Plan Note (Signed)
Multifactorial she has colonic AVMs portal hypertensive changes upper and lower.  Recheck CBC.

## 2021-03-21 NOTE — Assessment & Plan Note (Signed)
Continue nadolol. 

## 2021-03-21 NOTE — Assessment & Plan Note (Signed)
Repeat MRI.

## 2021-03-22 ENCOUNTER — Other Ambulatory Visit: Payer: Self-pay | Admitting: Internal Medicine

## 2021-03-22 ENCOUNTER — Encounter: Payer: Self-pay | Admitting: Internal Medicine

## 2021-03-22 DIAGNOSIS — K7290 Hepatic failure, unspecified without coma: Secondary | ICD-10-CM

## 2021-03-22 DIAGNOSIS — K7682 Hepatic encephalopathy: Secondary | ICD-10-CM | POA: Insufficient documentation

## 2021-03-22 HISTORY — DX: Hepatic encephalopathy: K76.82

## 2021-03-22 HISTORY — DX: Hepatic failure, unspecified without coma: K72.90

## 2021-03-22 MED ORDER — RIFAXIMIN 550 MG PO TABS: 550.0000 mg | ORAL_TABLET | Freq: Two times a day (BID) | ORAL | 1 refills | Status: DC

## 2021-03-22 NOTE — Addendum Note (Signed)
Addended by: Aleatha Borer on: 03/22/2021 04:06 PM   Modules accepted: Orders

## 2021-03-23 ENCOUNTER — Telehealth: Payer: Self-pay | Admitting: *Deleted

## 2021-03-24 NOTE — Telephone Encounter (Signed)
Noted faxed to [facility]

## 2021-03-29 ENCOUNTER — Other Ambulatory Visit: Payer: Self-pay | Admitting: Internal Medicine

## 2021-03-29 MED ORDER — LACTULOSE 10 GM/15ML PO SOLN: 10.0000 g | Freq: Two times a day (BID) | ORAL | 3 refills | Status: AC

## 2021-04-01 ENCOUNTER — Ambulatory Visit
Admission: RE | Admit: 2021-04-01 | Discharge: 2021-04-01 | Disposition: A | Discharge status: Home / Self Care | Payer: MEDICARE | Source: Ambulatory Visit | Attending: Internal Medicine | Admitting: Internal Medicine

## 2021-04-01 ENCOUNTER — Other Ambulatory Visit: Payer: Self-pay

## 2021-04-01 DIAGNOSIS — I8510 Secondary esophageal varices without bleeding: Secondary | ICD-10-CM

## 2021-04-01 DIAGNOSIS — R1013 Epigastric pain: Secondary | ICD-10-CM

## 2021-04-01 DIAGNOSIS — K862 Cyst of pancreas: Secondary | ICD-10-CM

## 2021-04-01 MED ORDER — GADOBENATE DIMEGLUMINE 529 MG/ML IV SOLN
18.0000 mL | Freq: Once | INTRAVENOUS | Status: AC | PRN
  Administered 2021-04-01: 18 mL via INTRAVENOUS

## 2021-04-06 ENCOUNTER — Telehealth: Payer: Self-pay

## 2021-04-06 NOTE — Telephone Encounter (Signed)
Opened in error

## 2021-05-04 ENCOUNTER — Other Ambulatory Visit: Payer: Self-pay

## 2021-05-04 DIAGNOSIS — R490 Dysphonia: Secondary | ICD-10-CM

## 2021-05-04 DIAGNOSIS — H9319 Tinnitus, unspecified ear: Secondary | ICD-10-CM

## 2021-06-13 ENCOUNTER — Ambulatory Visit (INDEPENDENT_AMBULATORY_CARE_PROVIDER_SITE_OTHER): Payer: MEDICARE | Admitting: Otolaryngology

## 2021-06-13 ENCOUNTER — Other Ambulatory Visit: Payer: Self-pay

## 2021-06-13 DIAGNOSIS — H903 Sensorineural hearing loss, bilateral: Secondary | ICD-10-CM

## 2021-06-13 DIAGNOSIS — H9313 Tinnitus, bilateral: Secondary | ICD-10-CM

## 2021-06-13 DIAGNOSIS — K219 Gastro-esophageal reflux disease without esophagitis: Secondary | ICD-10-CM

## 2021-06-13 NOTE — Progress Notes (Signed)
HPI: Cristina Santos is a 80 y.o. female who presents is referred by Dr. Carlean Purl for evaluation of hoarseness as well as complaints of chronic tinnitus in both of her ears.  Her voice is doing better presently although at times she sounds a little gravelly.  She has history of GE reflux disease and used to take PPI twice daily but now taking pantoprazole once a day in the morning. She also apparently has a pancreatic cyst as well as liver cirrhosis and questionable liver mass that is being worked up according to the patient. She is working on losing weight. She states that she has some balance problems sometimes but no real true vertigo..  Past Medical History:  Diagnosis Date   Anemia    Cirrhosis of liver with trace ascites ([place]) 10/09/2020   Colon cancer ([place]) 1988   Diabetes ([place])    Encephalopathy, hepatic (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 10/09/2020   Pancreatic cysts 10/09/2020   Portal hypertension (HCC)-gastropathy and colopathy 10/09/2020   Sleep apnea    Thyroid disease    Past Surgical History:  Procedure Laterality Date   ABDOMINAL HYSTERECTOMY  1987   COLON RESECTION     COLONOSCOPY     PORTACATH PLACEMENT     Never removed placed at time of colon cancer treatment   SHOULDER ARTHROSCOPY     left   TOTAL KNEE ARTHROPLASTY Left 06/08/2019   Procedure: TOTAL KNEE ARTHROPLASTY;  Surgeon: Gaynelle Arabian, MD;  Location: [facility];  Service: Orthopedics;  Laterality: Left;  1mn   UPPER GASTROINTESTINAL ENDOSCOPY     Social History   Socioeconomic History   Marital status: Widowed    Spouse name: Not on file   Number of children: 2   Years of education: Not on file   Highest education level: Not on file  Occupational History   Occupation: retired  Tobacco Use   Smoking status:  Never   Smokeless tobacco: Never  Vaping Use   Vaping Use: Never used  Substance and Sexual Activity   Alcohol use: No   Drug use: No   Sexual activity: Not Currently  Other Topics Concern   Not on file  Social History Narrative   Married, 1 son one daughter. Daughter is a nDesigner, jewellery   She is retired   2 cups coffee per day   Social Determinants of HRadio broadcast assistantStrain: Not on file  Food Insecurity: Not on file  Transportation Needs: Not on file  Physical Activity: Not on file  Stress: Not on file  Social Connections: Not on file   Family History  Problem Relation Age of Onset   Prostate cancer Brother    Lung cancer Brother    Heart disease Brother    Bone cancer Brother    Diabetes Mother    Alzheimer's disease Mother    Diabetes Brother    Heart attack Brother    Lung cancer Brother    Bone cancer Brother    Lung cancer Sister        with mProducer, television/film/video   Ulcerative colitis Sister    Colon cancer Neg Hx    Esophageal cancer Neg Hx    Stomach cancer Neg Hx    Pancreatic cancer Neg Hx    AAA (abdominal aortic aneurysm)  Neg Hx    Allergies  Allergen Reactions   Latex Anaphylaxis   Codeine Palpitations   Prior to Admission medications   Medication Sig Start Date End Date Taking? Authorizing Provider  ascorbic acid (VITAMIN C) 500 MG tablet Take 500 mg by mouth daily.    [provider]  Biotin 1000 MCG tablet Take 1,000 mcg by mouth daily.    [provider]  Cholecalciferol 50 MCG (2000 UT) TABS Take 2,000 Units by mouth daily.    [provider]  diphenhydrAMINE-APAP, sleep, (TYLENOL PM EXTRA STRENGTH PO) Take 1 tablet by mouth as needed.    [provider]  KRILL OIL PO Take 1 tablet by mouth daily.    [provider]  levothyroxine (SYNTHROID) 75 MCG tablet Take 75 mcg by mouth daily before breakfast.     [provider]  magnesium 30 MG tablet Take 30 mg by mouth at bedtime.    [provider]  nadolol (CORGARD) 20 MG tablet Take 1 tablet (20 mg total) by mouth daily. 01/02/21   Gatha Mayer, MD  pantoprazole (PROTONIX) 40 MG tablet Take 40 mg by mouth daily.    [provider]  pramipexole (MIRAPEX) 0.5 MG tablet Take 0.5 mg by mouth at bedtime.    [provider]  vitamin B-12 (CYANOCOBALAMIN) 1000 MCG tablet Take 1,000 mcg by mouth daily.    [provider]     Positive ROS: Otherwise negative  All other systems have been reviewed and were otherwise negative with the exception of those mentioned in the HPI and as above.  Physical Exam: Constitutional: Alert, well-appearing, no acute distress.  On conversation in the office today she has no significant hoarseness. Ears: External ears without lesions or tenderness. Ear canals are clear bilaterally with intact, clear TMs bilaterally. Nasal: External nose without lesions. Septum with mild deformity and mild rhinitis.  Both middle meatus regions are clear with no signs of infection..  No polyps. Oral: Lips and gums without lesions. Tongue and palate mucosa without lesions. Posterior oropharynx clear..  Tonsil regions appear benign bilaterally.  Indirect laryngoscopy revealed a clear base of tongue vallecula and epiglottis.  Vocal cords were clear bilaterally with normal vocal mobility and no vocal cord lesions noted. Neck: No palpable adenopathy or masses. Respiratory: Breathing comfortably  Skin: No facial/neck lesions or rash noted.  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  SRT's were 35 dB on the right and 40 dB on the left.  She had type A tympanograms bilaterally.  Procedures  Assessment: History of hoarseness with normal vocal cord examination in the office today.  I suspect this is probably more related to her reflux disease. 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  Plan: Reassured patient of normal laryngeal examination with normal vocal cords bilaterally.  I suspect the reflux contributes some to her intermittent hoarseness. Reviewed with her that the tinnitus is secondary to the high-frequency sensorineural hearing loss in both ears and that she would benefit from use of hearing aids. Concerning her balance problems I suspect this is probably more related to vestibular weakness and would benefit from regular exercise and weight loss. She will follow-up here as needed.   Radene Journey, MD   CC:

## 2021-06-27 ENCOUNTER — Other Ambulatory Visit: Payer: Self-pay

## 2021-06-27 ENCOUNTER — Telehealth: Payer: Self-pay

## 2021-06-27 DIAGNOSIS — R933 Abnormal findings on diagnostic imaging of other parts of digestive tract: Secondary | ICD-10-CM

## 2021-06-27 NOTE — Telephone Encounter (Signed)
Order in [place] for MRI-abdomin w & w/o contrast, 3 month F/U from 04/01/21 MRI. Called  Imaging and let them know patient would like a Saturday appt. They will call patient to schedule.

## 2021-07-08 ENCOUNTER — Other Ambulatory Visit: Payer: Self-pay | Admitting: Internal Medicine

## 2021-07-10 NOTE — Telephone Encounter (Signed)
Please advise Sir? 

## 2021-07-12 ENCOUNTER — Ambulatory Visit
Admission: RE | Admit: 2021-07-12 | Discharge: 2021-07-12 | Disposition: A | Discharge status: Home / Self Care | Payer: MEDICARE | Source: Ambulatory Visit | Attending: Internal Medicine | Admitting: Internal Medicine

## 2021-07-12 DIAGNOSIS — R933 Abnormal findings on diagnostic imaging of other parts of digestive tract: Secondary | ICD-10-CM

## 2021-07-12 MED ORDER — GADOBENATE DIMEGLUMINE 529 MG/ML IV SOLN
18.0000 mL | Freq: Once | INTRAVENOUS | Status: AC | PRN
  Administered 2021-07-12: 18 mL via INTRAVENOUS

## 2021-07-18 ENCOUNTER — Other Ambulatory Visit: Payer: Self-pay

## 2021-07-18 DIAGNOSIS — K7460 Unspecified cirrhosis of liver: Secondary | ICD-10-CM

## 2021-08-08 ENCOUNTER — Other Ambulatory Visit: Payer: Self-pay | Admitting: Internal Medicine

## 2021-09-04 ENCOUNTER — Encounter (INDEPENDENT_AMBULATORY_CARE_PROVIDER_SITE_OTHER): Payer: Self-pay

## 2021-09-25 ENCOUNTER — Other Ambulatory Visit: Payer: Self-pay | Admitting: Internal Medicine

## 2021-10-18 ENCOUNTER — Telehealth: Payer: Self-pay

## 2021-10-18 ENCOUNTER — Other Ambulatory Visit: Payer: Self-pay

## 2021-10-18 DIAGNOSIS — K862 Cyst of pancreas: Secondary | ICD-10-CM

## 2021-10-18 DIAGNOSIS — K769 Liver disease, unspecified: Secondary | ICD-10-CM

## 2021-10-18 DIAGNOSIS — K7460 Unspecified cirrhosis of liver: Secondary | ICD-10-CM

## 2021-10-18 NOTE — Telephone Encounter (Signed)
Order was placed for MRI abdomen  w wo contrast.  Pt requested this to be done at [place].  [facility] contacted and they stated that they do see the order in Epic and that they will reach out to the pt. Pt Made aware Pt verbalized understanding with all questions answered.

## 2021-10-18 NOTE — Telephone Encounter (Signed)
-----   Message from Marlon Pel, RN sent at 10/18/2021  8:29 AM EST -----  ----- Message ----- From: Marlon Pel, RN Sent: 10/18/2021  12:00 AM EST To: Marlon Pel, RN  Needs MRI see results 8/30Carlean Purl

## 2021-10-23 ENCOUNTER — Other Ambulatory Visit: Payer: Self-pay | Admitting: Internal Medicine

## 2021-10-23 NOTE — Telephone Encounter (Signed)
How many refills can I put on her nadolol Sir?

## 2021-10-27 ENCOUNTER — Ambulatory Visit (HOSPITAL_COMMUNITY): Payer: MEDICARE

## 2021-11-04 ENCOUNTER — Other Ambulatory Visit: Payer: MEDICARE

## 2021-11-25 ENCOUNTER — Ambulatory Visit
Admission: RE | Admit: 2021-11-25 | Discharge: 2021-11-25 | Disposition: A | Discharge status: Home / Self Care | Payer: MEDICARE | Source: Ambulatory Visit | Attending: Internal Medicine | Admitting: Internal Medicine

## 2021-11-25 ENCOUNTER — Other Ambulatory Visit: Payer: Self-pay

## 2021-11-25 DIAGNOSIS — K7460 Unspecified cirrhosis of liver: Secondary | ICD-10-CM

## 2021-11-25 DIAGNOSIS — K862 Cyst of pancreas: Secondary | ICD-10-CM

## 2021-11-25 DIAGNOSIS — R188 Other ascites: Secondary | ICD-10-CM

## 2021-11-25 DIAGNOSIS — K769 Liver disease, unspecified: Secondary | ICD-10-CM

## 2021-11-25 MED ORDER — GADOBENATE DIMEGLUMINE 529 MG/ML IV SOLN
18.0000 mL | Freq: Once | INTRAVENOUS | Status: AC | PRN
  Administered 2021-11-25: 18 mL via INTRAVENOUS

## 2022-01-01 ENCOUNTER — Other Ambulatory Visit (INDEPENDENT_AMBULATORY_CARE_PROVIDER_SITE_OTHER): Payer: MEDICARE

## 2022-01-01 ENCOUNTER — Ambulatory Visit (INDEPENDENT_AMBULATORY_CARE_PROVIDER_SITE_OTHER): Payer: MEDICARE | Admitting: Internal Medicine

## 2022-01-01 VITALS — BP 148/72 | HR 84 | Ht 65.5 in | Wt 200.0 lb

## 2022-01-01 DIAGNOSIS — K862 Cyst of pancreas: Secondary | ICD-10-CM

## 2022-01-01 DIAGNOSIS — R490 Dysphonia: Secondary | ICD-10-CM

## 2022-01-01 DIAGNOSIS — K766 Portal hypertension: Secondary | ICD-10-CM | POA: Diagnosis not present

## 2022-01-01 DIAGNOSIS — K7581 Nonalcoholic steatohepatitis (NASH): Secondary | ICD-10-CM | POA: Diagnosis not present

## 2022-01-01 DIAGNOSIS — R188 Other ascites: Secondary | ICD-10-CM

## 2022-01-01 DIAGNOSIS — I8510 Secondary esophageal varices without bleeding: Secondary | ICD-10-CM

## 2022-01-01 DIAGNOSIS — K7460 Unspecified cirrhosis of liver: Secondary | ICD-10-CM

## 2022-01-01 DIAGNOSIS — D500 Iron deficiency anemia secondary to blood loss (chronic): Secondary | ICD-10-CM

## 2022-01-01 DIAGNOSIS — Z85038 Personal history of other malignant neoplasm of large intestine: Secondary | ICD-10-CM

## 2022-01-01 LAB — PROTIME-INR
INR: 1.2 ratio — ABNORMAL HIGH (ref 0.8–1.0)
Prothrombin Time: 12.9 s (ref 9.6–13.1)

## 2022-01-01 LAB — CBC
HCT: 38.5 % (ref 36.0–46.0)
Hemoglobin: 12.9 g/dL (ref 12.0–15.0)
MCHC: 33.4 g/dL (ref 30.0–36.0)
MCV: 84.9 fl (ref 78.0–100.0)
Platelets: 122 10*3/uL — ABNORMAL LOW (ref 150.0–400.0)
RBC: 4.54 Mil/uL (ref 3.87–5.11)
RDW: 15.6 % — ABNORMAL HIGH (ref 11.5–15.5)
WBC: 5.8 10*3/uL (ref 4.0–10.5)

## 2022-01-01 NOTE — Progress Notes (Signed)
Cristina Santos 81 y.o. 1941-03-05 [id number]  Assessment & Plan:   Encounter Diagnoses  Name Primary?   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  Current dose seems adequate based upon home pulse rates.  She does not need surveillance EGD on beta-blocker.  Follow-up MRI in 1 year to recheck liver lesion and pancreatic cysts.  Continue PPI  Continue other medications as she is  Return to me in about 6 months sooner as needed   She had a c-Met today sodium 132 slightly low otherwise totally normal. AFP 2.9 INR 1.2  She remains a Childs Class A at 5-6 points based upon most recent testing.  She remains understandingly concerned about her abnormalities but I explained to her that her liver synthetic function remains good and though she has some portal hypertension issues in this liver lesion it could be much worse.  I further explained that she should pay very attention to any illness as I think the main threat to her health at this time would be some sort of other illness that stresses the body and liver.  So she should present promptly if there is some sort of significant infectious illness etc.  Liver lesion is tiny alpha-fetoprotein is okay most of these do not develop into cancer and a 1 year follow-up seems appropriate to me as well as recommended by radiology.  CC: Earney Mallet, MD   Subjective:   Chief Complaint: Follow-up of cirrhosis  HPI 81 year old white woman with a history of Cristina Santos cirrhosis and pancreatic cyst here for follow-up, her nurse practitioner daughter is in attendance.  With respect to her  liver disease things have been stable.  She recently had an MRI to follow-up liver lesion and pancreatic cysts.  IMPRESSION: 1. Stable 6 mm early arterial phase enhancing nodule in segment 7 of the liver. This remains indeterminate (LI-RADS 3) but is likely a small dysplastic nodule. Continued surveillance is suggested. Repeat MRI examination in 12 months. 2. Stable nonenhancing 10 mm cyst involving the pancreatic head. Likely benign postinflammatory cyst. Recommend continued observation. 3. Prior 18 mm cyst projecting off the pancreatic head/body junction region has near completely resolved. 4. No new pancreatic lesions. 5. Stable cirrhotic changes involving the liver with portal venous collaterals and suspected paraesophageal varices. 6. No abdominal lymphadenopathy.     Electronically Signed   By: Marijo Sanes M.D.   On: 11/26/2021 12:15 She continues to have some inner ear-like spells with dizziness and had 1 today.  She still has some hoarseness at times.  She has had an EGD and ENT evaluation of the bone unrevealing.  Heartburn is under control.  She brought a list of her pulses and most of them are in the 40s and 50s.  She also had some labs done recently and her hemoglobin was 12.5 MCV 84 platelets 117 which is stable.  White count 3.9.  Albumin 4.2 bilirubin 1.2 alk phos 101 AST 22 ALT 17 and sodium 133.  GFR 71.  T4 normal ferritin 61 iron sat 22%  Allergies  Allergen Reactions   Latex Anaphylaxis   Codeine Palpitations  Current Meds  Medication Sig   ascorbic acid (VITAMIN C) 500 MG tablet Take 500 mg by mouth daily.   Biotin 1000 MCG tablet Take 1,000 mcg by mouth daily.   Cholecalciferol 50 MCG (2000 UT) TABS Take 2,000 Units by mouth daily.   diphenhydrAMINE-APAP, sleep, (TYLENOL PM EXTRA STRENGTH PO) Take 1 tablet by mouth as needed.   KRILL OIL PO Take 1 tablet by mouth daily.   levothyroxine (SYNTHROID) 75 MCG tablet Take 75 mcg by mouth daily before  breakfast.    magnesium 30 MG tablet Take 30 mg by mouth at bedtime.   nadolol (CORGARD) 20 MG tablet Take 1 tablet by mouth once daily   pantoprazole (PROTONIX) 40 MG tablet Take 40 mg by mouth daily.   pramipexole (MIRAPEX) 0.5 MG tablet Take 0.5 mg by mouth at bedtime.   vitamin B-12 (CYANOCOBALAMIN) 1000 MCG tablet Take 1,000 mcg by mouth daily.   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 10/09/2020   Pancreatic cysts 10/09/2020   Portal hypertension (HCC)-gastropathy and colopathy 10/09/2020   Sleep apnea    Thyroid disease    Past Surgical History:  Procedure Laterality Date   ABDOMINAL HYSTERECTOMY  1987   COLON RESECTION     COLONOSCOPY     PORTACATH PLACEMENT     Never removed placed at time of colon cancer treatment   SHOULDER ARTHROSCOPY     left   TOTAL KNEE ARTHROPLASTY Left 06/08/2019   Procedure: TOTAL KNEE ARTHROPLASTY;  Surgeon: Gaynelle Arabian, MD;  Location: [facility];  Service: Orthopedics;  Laterality: Left;  34mn   UPPER GASTROINTESTINAL ENDOSCOPY     Social History   Social History Narrative   Married, 1 son one daughter. Daughter is a nDesigner, jewellery   She is retired   2 cups coffee per day   family history includes Alzheimer's disease in her mother; Bone cancer in her brother and brother; Diabetes in her brother and mother; Heart attack in her brother; Heart disease in her brother; Lung cancer in her brother, brother, and sister; Prostate cancer in her brother; Ulcerative colitis in her sister.   Review of Systems  As per HPI Objective:   Physical Exam BP (!) 148/72    Pulse 84    Ht 5'  5.5" (1.664 m)    Wt 200 lb (90.7 kg)    BMI 32.78 kg/m  Obese elderly ww NAD Lungs cta Cor NL S1S2 no rmg Abd somewhat obese, soft NT w/o HSM/mass Skin - no stimata CLD Ext tr ankle edema

## 2022-01-01 NOTE — Patient Instructions (Signed)
Your provider has requested that you go to the basement level for lab work before leaving today. Press "B" on the elevator. The lab is located at the first door on the left as you exit the elevator. INR, AFP   Due to recent changes in healthcare laws, you may see the results of your imaging and laboratory studies on MyChart before your provider has had a chance to review them.  We understand that in some cases there may be results that are confusing or concerning to you. Not all laboratory results come back in the same time frame and the provider may be waiting for multiple results in order to interpret others.  Please give [place] 48 hours in order for your provider to thoroughly review all the results before contacting the office for clarification of your results.   I appreciate the opportunity to care for you. Silvano Rusk, MD, [facility]

## 2022-01-02 ENCOUNTER — Encounter: Payer: Self-pay | Admitting: Internal Medicine

## 2022-01-02 LAB — COMPREHENSIVE METABOLIC PANEL
ALT: 19 U/L (ref 0–35)
AST: 18 U/L (ref 0–37)
Albumin: 4.3 g/dL (ref 3.5–5.2)
Alkaline Phosphatase: 76 U/L (ref 39–117)
BUN: 14 mg/dL (ref 6–23)
CO2: 32 mEq/L (ref 19–32)
Calcium: 9.8 mg/dL (ref 8.4–10.5)
Chloride: 99 mEq/L (ref 96–112)
Creatinine, Ser: 0.81 mg/dL (ref 0.40–1.20)
GFR: 67.65 mL/min (ref >60.00)
Glucose, Bld: 89 mg/dL (ref 70–99)
Potassium: 5 mEq/L (ref 3.5–5.1)
Sodium: 132 mEq/L — ABNORMAL LOW (ref 135–145)
Total Bilirubin: 1.2 mg/dL (ref 0.2–1.2)
Total Protein: 6.8 g/dL (ref 6.0–8.3)

## 2022-01-02 LAB — AFP TUMOR MARKER: AFP-Tumor Marker: 2.9 ng/mL

## 2022-01-12 ENCOUNTER — Ambulatory Visit: Payer: MEDICARE | Admitting: Internal Medicine

## 2022-04-30 ENCOUNTER — Other Ambulatory Visit: Payer: Self-pay | Admitting: Internal Medicine

## 2022-06-01 ENCOUNTER — Other Ambulatory Visit: Payer: Self-pay | Admitting: Internal Medicine

## 2022-06-04 IMAGING — MR MR ABDOMEN WO/W CM
10 of 17 series · 26 of 48 positions shown · IV contrast (multihance)
Comparison: Prior MR examinations. The most recent is 07/12/2021

CLINICAL DATA: Follow-up hepatic lesions. History of cirrhosis.

EXAM:
MRI ABDOMEN WITHOUT AND WITH CONTRAST
TECHNIQUE: Multiplanar multisequence MR imaging of the abdomen was performed
both before and after the administration of intravenous contrast.
CONTRAST:  18mL MULTIHANCE GADOBENATE DIMEGLUMINE 529 MG/ML IV SOLN

[Series 3: cor haste · coronal · 5.0mm · 0.68mm/px · 3 of 39 slices shown]
[im 1/39]
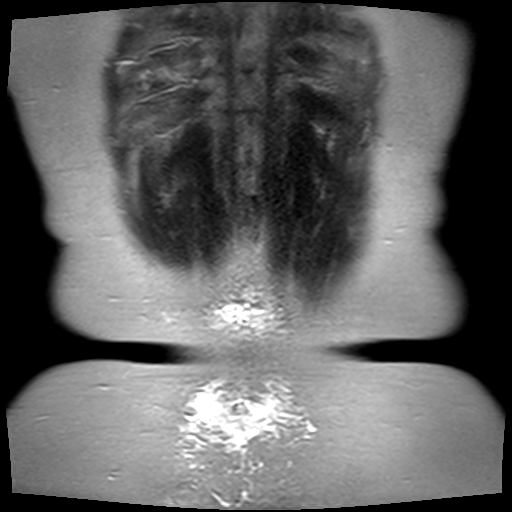
[im 20/39]
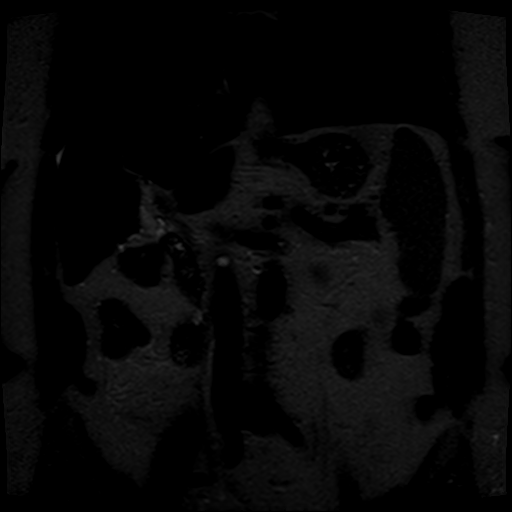
[im 39/39]
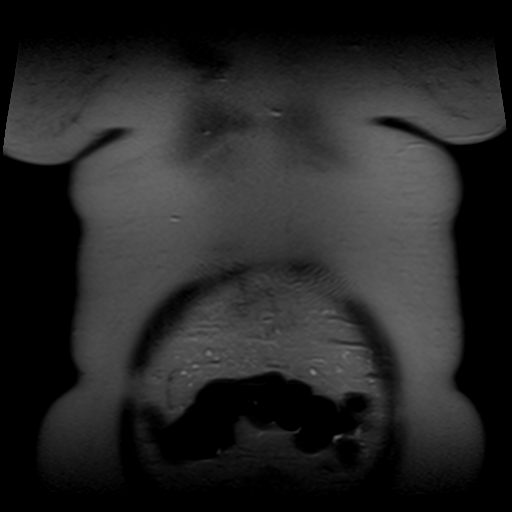

[Series 4: axial haste · axial · 6.0mm · 0.68mm/px · z∈[-94,+118]mm · 2 of 33 slices shown]
[im 1/33]
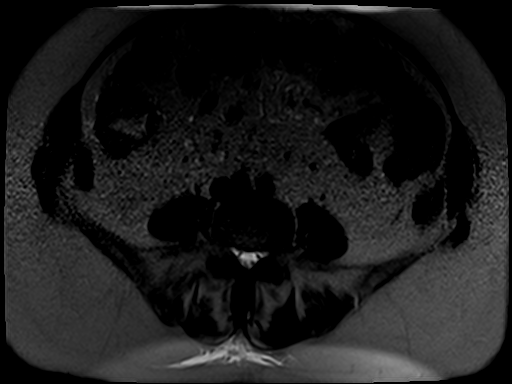
[im 33/33]
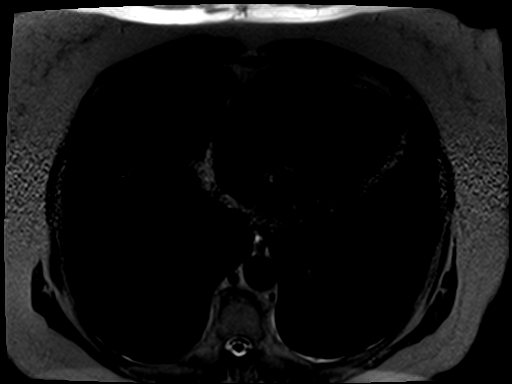

[Series 5: T1 · axial · 6.0mm · 0.68mm/px · z∈[-91,+120]mm · 4 of 66 slices shown]
[im 1/66]
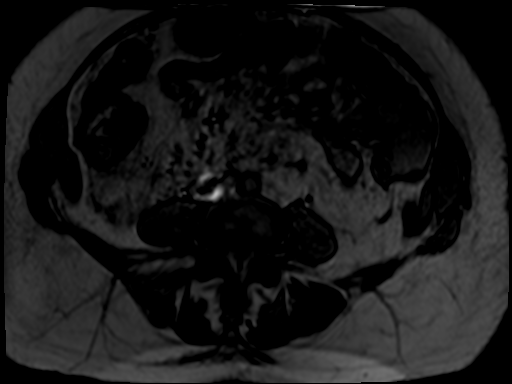
[im 22/66]
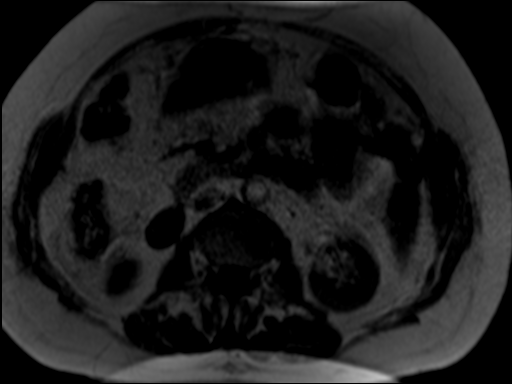
[im 44/66]
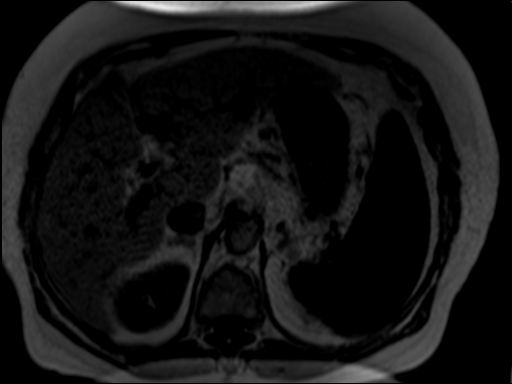
[im 66/66]
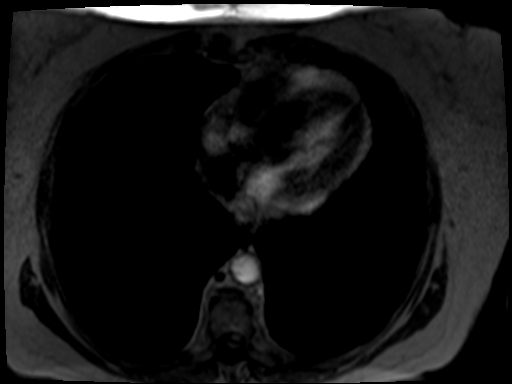

[Series 6: bSSFP · axial · 4.0mm · 0.68mm/px · z∈[-110,+130]mm · 3 of 61 slices shown]
[im 1/61]
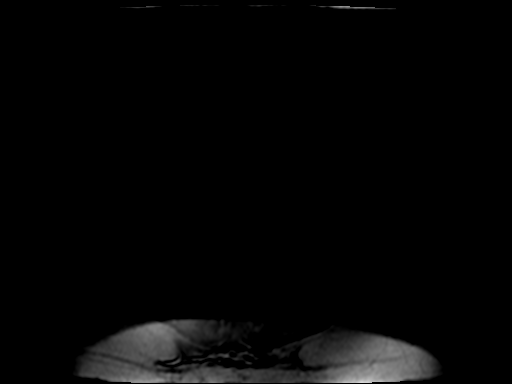
[im 31/61]
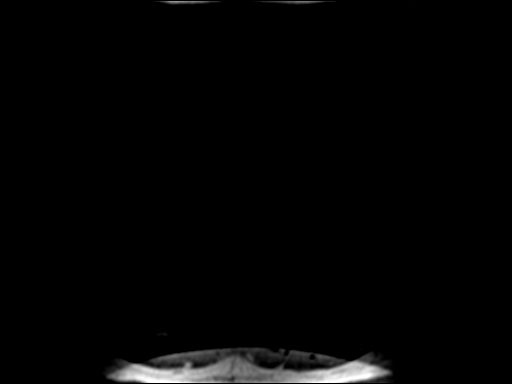
[im 61/61]
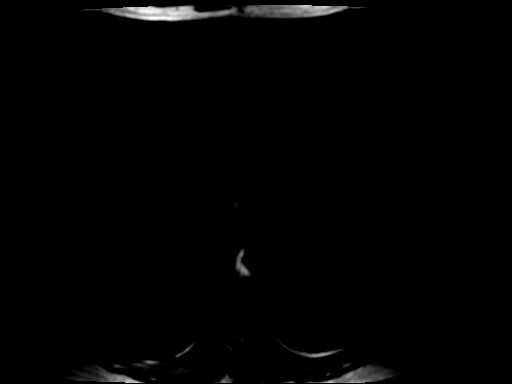

[Series 7: T2 fat-sat · axial · 6.0mm · 1.09mm/px · 1 of 32 slices shown]
[im 1/32]
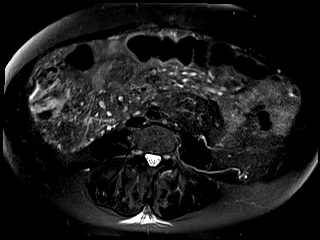

[Series 8: ep2d_[id number]_500_800_p2_trig · axial · 6.0mm · 1.82mm/px · z∈[-69,+154]mm · 4 of 96 slices shown]
[im 1/96]
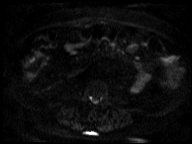
[im 32/96]
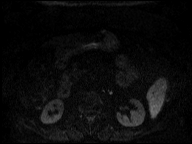
[im 64/96]
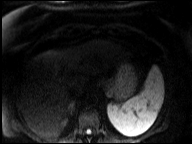
[im 96/96]
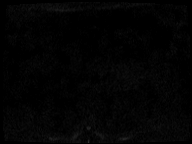

[Series 9: [id number]_trig_adc · axial · 6.0mm · 1.82mm/px · 1 of 32 slices shown]
[im 1/32]
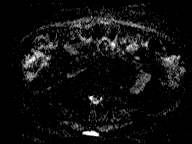

[Series 10: T1 dynamic · axial · non-contrast · 2.5mm · 0.74mm/px · z∈[-98,+120]mm · 3 of 88 slices shown]
[im 1/88]
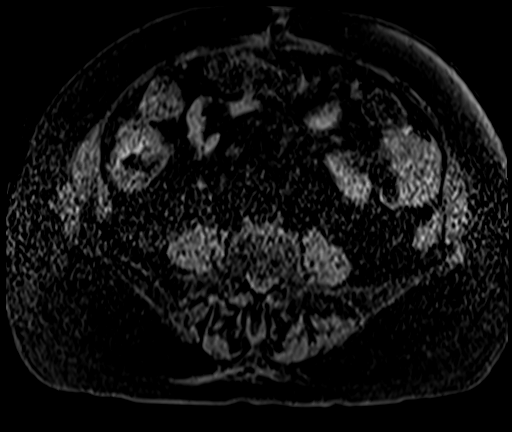
[im 44/88]
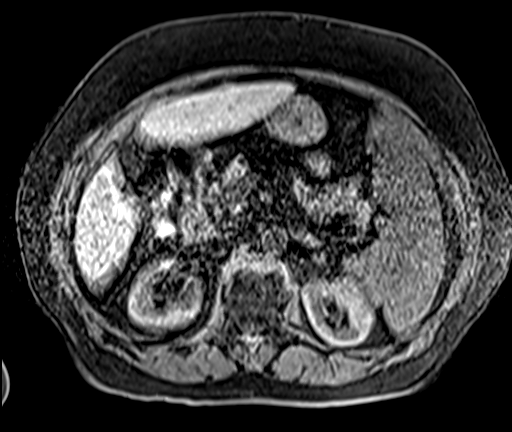
[im 88/88]
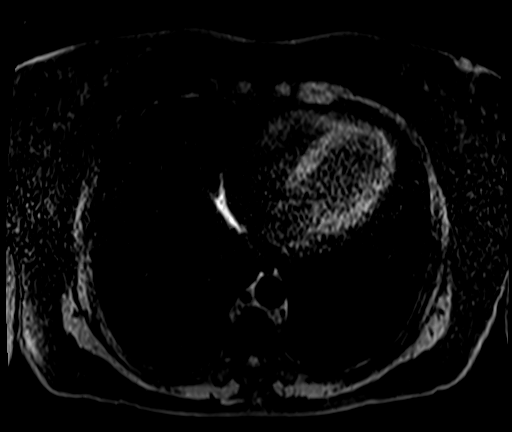

[Series 11: T1 dynamic post-contrast · axial · 2.5mm · 0.74mm/px · z∈[-98,+120]mm · 3 of 88 slices shown (1 of 2)]
[im 1/88]
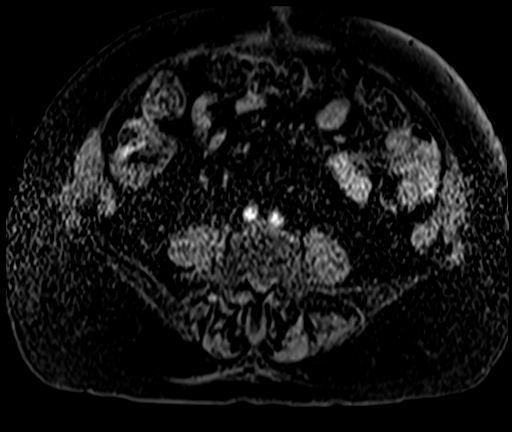
[im 44/88]
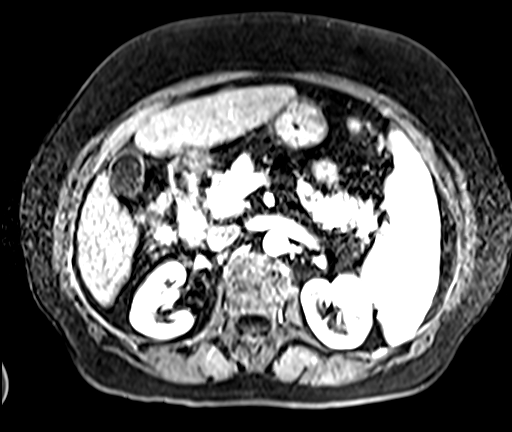
[im 88/88]
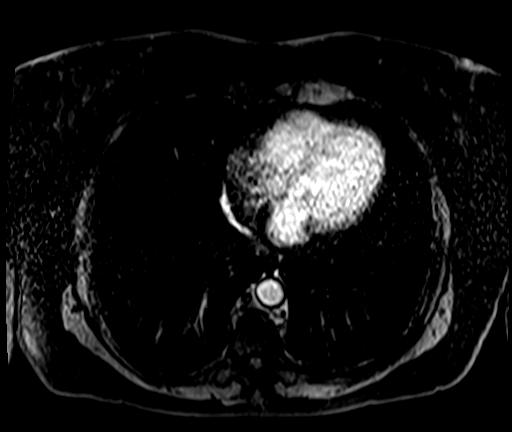

[Series 12: T1 dynamic post-contrast · axial · 2.5mm · 0.74mm/px · z∈[-98,+10]mm · 2 of 88 slices shown (2 of 2)]
[im 1/88]
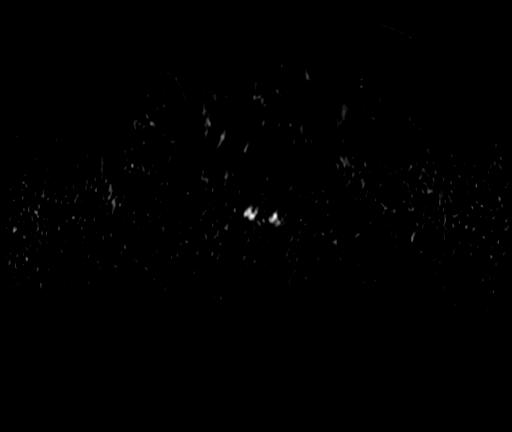
[im 44/88]
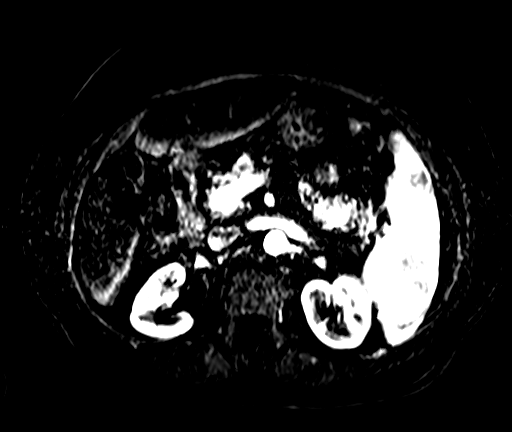

[26 of 48 positions shown; findings below may reference images not displayed]

FINDINGS: Lower chest: The lung bases are grossly clear. No pulmonary lesions,
pleural or pericardial effusion.

Hepatobiliary: Stable cirrhotic changes involving the liver. No
intrahepatic biliary dilatation. Gallbladder is unremarkable. No
common bile duct dilatation.

There is a stable 6 mm early arterial phase enhancing nodule in
segment 7 of the liver. This remains indeterminate (CIRIACO 3 but is
likely a small dysplastic nodule. Continued surveillance is
suggested.

On the most recent prior MRI examinations it was a suspected new
small enhancing nodule in segment 4A of the liver. I do not see this
on today's examination.

No new enhancing hepatic lesions are identified. The portal and
splenic veins are patent.

Pancreas: Stable 10 mm exophytic cyst projecting off the pancreatic
head just anterior to the IVC. This appears septated and is likely a
postinflammatory cyst. No worrisome enhancement or change since
prior studies.

Prior 18 mm cyst projecting off the pancreatic head/body junction
region has near completely resolved. Tiny residual cyst in this
location measures 4 mm. No new pancreatic lesions or evidence of
acute inflammation. Normal caliber and course of the main pancreatic
duct.

Spleen:  Stable splenomegaly. No splenic lesions.

Adrenals/Urinary Tract: The adrenal glands and kidneys are
unremarkable. No worrisome renal lesions or hydronephrosis.

Stomach/Bowel: The stomach, duodenum, visualized small bowel and
visualized colon are grossly normal.

Vascular/Lymphatic: The aorta and branch vessels are patent. The
major venous structures are patent. Stable portal venous collaterals
and suspected paraesophageal varices. No abdominal lymphadenopathy.

Other:  No ascites or abdominal wall hernia.

Musculoskeletal: No significant bony findings.
IMPRESSION: 1. Stable 6 mm early arterial phase enhancing nodule in segment 7 of
the liver. This remains indeterminate (CIRIACO 3) but is likely a
small dysplastic nodule. Continued surveillance is suggested. Repeat
MRI examination in 12 months.
2. Stable nonenhancing 10 mm cyst involving the pancreatic head.
Likely benign postinflammatory cyst. Recommend continued
observation.
3. Prior 18 mm cyst projecting off the pancreatic head/body junction
region has near completely resolved.
4. No new pancreatic lesions.
5. Stable cirrhotic changes involving the liver with portal venous
collaterals and suspected paraesophageal varices.
6. No abdominal lymphadenopathy.

## 2022-08-30 ENCOUNTER — Ambulatory Visit: Payer: MEDICARE | Admitting: Internal Medicine

## 2022-09-07 ENCOUNTER — Other Ambulatory Visit (INDEPENDENT_AMBULATORY_CARE_PROVIDER_SITE_OTHER): Payer: MEDICARE

## 2022-09-07 ENCOUNTER — Encounter: Payer: Self-pay | Admitting: Internal Medicine

## 2022-09-07 ENCOUNTER — Ambulatory Visit (INDEPENDENT_AMBULATORY_CARE_PROVIDER_SITE_OTHER): Payer: MEDICARE | Admitting: Internal Medicine

## 2022-09-07 VITALS — BP 140/70 | HR 60 | Ht 65.5 in | Wt 193.5 lb

## 2022-09-07 DIAGNOSIS — K769 Liver disease, unspecified: Secondary | ICD-10-CM

## 2022-09-07 DIAGNOSIS — I8510 Secondary esophageal varices without bleeding: Secondary | ICD-10-CM

## 2022-09-07 DIAGNOSIS — K766 Portal hypertension: Secondary | ICD-10-CM | POA: Diagnosis not present

## 2022-09-07 DIAGNOSIS — K7581 Nonalcoholic steatohepatitis (NASH): Secondary | ICD-10-CM

## 2022-09-07 DIAGNOSIS — K7460 Unspecified cirrhosis of liver: Secondary | ICD-10-CM

## 2022-09-07 DIAGNOSIS — K862 Cyst of pancreas: Secondary | ICD-10-CM

## 2022-09-07 LAB — COMPREHENSIVE METABOLIC PANEL
ALT: 13 U/L (ref 0–35)
AST: 17 U/L (ref 0–37)
Albumin: 4.2 g/dL (ref 3.5–5.2)
Alkaline Phosphatase: 76 U/L (ref 39–117)
BUN: 12 mg/dL (ref 6–23)
CO2: 26 mEq/L (ref 19–32)
Calcium: 9.6 mg/dL (ref 8.4–10.5)
Chloride: 100 mEq/L (ref 96–112)
Creatinine, Ser: 0.76 mg/dL (ref 0.40–1.20)
GFR: 72.67 mL/min (ref >60.00)
Glucose, Bld: 106 mg/dL — ABNORMAL HIGH (ref 70–99)
Potassium: 4.4 mEq/L (ref 3.5–5.1)
Sodium: 132 mEq/L — ABNORMAL LOW (ref 135–145)
Total Bilirubin: 1 mg/dL (ref 0.2–1.2)
Total Protein: 6.6 g/dL (ref 6.0–8.3)

## 2022-09-07 LAB — CBC WITH DIFFERENTIAL/PLATELET
Basophils Absolute: 0 10*3/uL (ref 0.0–0.1)
Basophils Relative: 0.5 % (ref 0.0–3.0)
Eosinophils Absolute: 0.2 10*3/uL (ref 0.0–0.7)
Eosinophils Relative: 4.2 % (ref 0.0–5.0)
HCT: 35.4 % — ABNORMAL LOW (ref 36.0–46.0)
Hemoglobin: 11.6 g/dL — ABNORMAL LOW (ref 12.0–15.0)
Lymphocytes Relative: 18.6 % (ref 12.0–46.0)
Lymphs Abs: 0.8 10*3/uL (ref 0.7–4.0)
MCHC: 32.7 g/dL (ref 30.0–36.0)
MCV: 84.7 fl (ref 78.0–100.0)
Monocytes Absolute: 0.3 10*3/uL (ref 0.1–1.0)
Monocytes Relative: 6.7 % (ref 3.0–12.0)
Neutro Abs: 3.1 10*3/uL (ref 1.4–7.7)
Neutrophils Relative %: 70 % (ref 43.0–77.0)
Platelets: 112 10*3/uL — ABNORMAL LOW (ref 150.0–400.0)
RBC: 4.18 Mil/uL (ref 3.87–5.11)
RDW: 15.8 % — ABNORMAL HIGH (ref 11.5–15.5)
WBC: 4.4 10*3/uL (ref 4.0–10.5)

## 2022-09-07 LAB — PROTIME-INR
INR: 1.2 ratio — ABNORMAL HIGH (ref 0.8–1.0)
Prothrombin Time: 13 s (ref 9.6–13.1)

## 2022-09-07 NOTE — Patient Instructions (Signed)
Your provider has requested that you go to the basement level for lab work before leaving today. Press "B" on the elevator. The lab is located at the first door on the left as you exit the elevator.  Due to recent changes in healthcare laws, you may see the results of your imaging and laboratory studies on MyChart before your provider has had a chance to review them.  We understand that in some cases there may be results that are confusing or concerning to you. Not all laboratory results come back in the same time frame and the provider may be waiting for multiple results in order to interpret others.  Please give [place] 48 hours in order for your provider to thoroughly review all the results before contacting the office for clarification of your results.   You are due in January for a MRI.   I appreciate the opportunity to care for you. Silvano Rusk, MD, [facility]

## 2022-09-07 NOTE — Progress Notes (Signed)
Cristina Santos 81 y.o. May 23, 1941 [id number]  Assessment & Plan:   Encounter Diagnoses  Name Primary?   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  We will check labs today to include CBC, CMET, INR and alpha-fetoprotein.  She will continue beta-blocker.  Follow-up MRI for liver lesion and pancreatic cyst in January 2024.  Anticipate office follow-up in about 83 months sooner if needed.  Reviewed decompensation signs such as edema ascites confusion bleeding.  Subjective:   Chief Complaint: Cirrhosis follow-up  HPI 81 year old white woman with Cristina Santos related cirrhosis, history of ascites and 1+ varices and a remote history of colon cancer resected in 1988, presents for follow-up, last seen in February 2023.  MRI in January for follow-up of liver nodule as follows:  IMPRESSION: 1. Stable 6 mm early arterial phase enhancing nodule in segment 7 of the liver. This remains indeterminate (LI-RADS 3) but is likely a small dysplastic nodule. Continued surveillance is suggested. Repeat MRI examination in 12 months. 2. Stable nonenhancing 10 mm cyst involving the pancreatic head. Likely benign postinflammatory cyst. Recommend continued observation. 3. Prior 18 mm cyst projecting off the pancreatic head/body junction region has near completely resolved. 4. No new pancreatic lesions. 5. Stable cirrhotic changes involving the liver with portal venous collaterals and suspected paraesophageal varices. 6. No abdominal lymphadenopathy.  Wt Readings from Last 3 Encounters:  09/07/22 193 lb 8 oz (87.8 kg)  01/01/22 200 lb (90.7 kg)  03/21/21 193 lb (87.5 kg)   She reports no problems with swelling bleeding confusion.  Some fatigue.  She has had regular follow-up in [place] and had labs in July.   At that time white count 3.4 hemoglobin 12.1 platelets 107 these are stable numbers.  LFTs were normal.  Albumin 4.3 kidney function normal.  TSH normal.  A1c 6.1.  Ferritin 59.  She saw a neurologist in [facility] (daughter is an NP there) because of dizzy spells and visual symptoms and the thought was she most likely had vestibular migraine.  M She had a small focus of ischemic change on the MRI and he thought she had a migraine and recommended topiramate.  She has decided not to take that.  Some unfortunate family news she lost her sister-in-law recently and her daughter-in-law was recently found to be paralyzed related to MRSA spinal abscess.  Allergies  Allergen Reactions   Latex Anaphylaxis   Codeine Palpitations   Current Meds  Medication Sig   ascorbic acid (VITAMIN C) 500 MG tablet Take 500 mg by mouth daily.   Biotin 1000 MCG tablet Take 1,000 mcg by mouth daily.   Cholecalciferol 50 MCG (2000 UT) TABS Take 2,000 Units by mouth daily.   diphenhydrAMINE-APAP, sleep, (TYLENOL PM EXTRA STRENGTH PO) Take 1 tablet by mouth as needed.   KRILL OIL PO Take 1 tablet by mouth daily.   levothyroxine (SYNTHROID) 75 MCG tablet Take 75 mcg by mouth daily before breakfast.    magnesium 30 MG tablet Take 30 mg by mouth at bedtime.   nadolol (CORGARD) 20 MG tablet Take 1 tablet by mouth once daily   pantoprazole (PROTONIX) 40 MG tablet Take 40 mg by mouth daily.   pramipexole (MIRAPEX) 0.5 MG tablet Take 0.5 mg by mouth at bedtime.  vitamin B-12 (CYANOCOBALAMIN) 1000 MCG tablet Take 1,000 mcg by mouth daily.   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 10/09/2020    Pancreatic cysts 10/09/2020   Portal hypertension (HCC)-gastropathy and colopathy 10/09/2020   Sleep apnea    Thyroid disease    Past Surgical History:  Procedure Laterality Date   ABDOMINAL HYSTERECTOMY  1987   COLON RESECTION     COLONOSCOPY     PORTACATH PLACEMENT     Never removed placed at time of colon cancer treatment   SHOULDER ARTHROSCOPY     left   TOTAL KNEE ARTHROPLASTY Left 06/08/2019   Procedure: TOTAL KNEE ARTHROPLASTY;  Surgeon: Gaynelle Arabian, MD;  Location: [facility];  Service: Orthopedics;  Laterality: Left;  38mn   UPPER GASTROINTESTINAL ENDOSCOPY     Social History   Social History Narrative   Married, 1 son one daughter. Daughter is a nDesigner, jewellery   She is retired   2 cups coffee per day   family history includes Alzheimer's disease in her mother; Bone cancer in her brother and brother; Diabetes in her brother and mother; Heart attack in her brother; Heart disease in her brother; Lung cancer in her brother, brother, and sister; Prostate cancer in her brother; Ulcerative colitis in her sister.   Review of Systems As above  Objective:   Physical Exam '@BP'$  (!) 140/70 (BP Location: Left Arm, Patient Position: Sitting, Cuff Size: Normal)   Pulse 60   Ht 5' 5.5" (1.664 m)   Wt 193 lb 8 oz (87.8 kg)   SpO2 96%   BMI 31.71 kg/m @  General:  NAD Eyes:   anicteric Lungs:  clear Heart::  S1S2 no rubs, murmurs or gallops Abdomen:  soft and nontender, BS+, no HSM/mass, no ascites Ext:   no edema, cyanosis or clubbing Neuro:  Alert and oriented x 3 no asterixis    Data Reviewed:  See HPI

## 2022-09-10 LAB — AFP TUMOR MARKER: AFP-Tumor Marker: 2.8 ng/mL

## 2022-09-30 ENCOUNTER — Other Ambulatory Visit: Payer: Self-pay | Admitting: Internal Medicine

## 2022-10-02 ENCOUNTER — Telehealth: Payer: Self-pay | Admitting: Pharmacy Technician

## 2022-10-02 ENCOUNTER — Other Ambulatory Visit (HOSPITAL_COMMUNITY): Payer: Self-pay

## 2022-10-02 NOTE — Telephone Encounter (Signed)
Patient Advocate Encounter  Received notification from [facility] that prior authorization for NADOLOL '20MG'$  is required.   PA submitted on 11.14.23 Key [id number]  Status is pending    Luciano Cutter, CPhT Patient Advocate Phone: [phone]

## 2022-10-02 NOTE — Telephone Encounter (Signed)
Patient Advocate Encounter  Prior Authorization for NADOLOL '20MG'$  has been approved.    PA#  [id number] Effective dates: 11.14.23 through 12.31.24  Daley Gosse B. CPhT P: [phone] F: [phone]

## 2022-10-30 ENCOUNTER — Telehealth: Payer: Self-pay

## 2022-10-30 ENCOUNTER — Other Ambulatory Visit: Payer: Self-pay

## 2022-10-30 DIAGNOSIS — K769 Liver disease, unspecified: Secondary | ICD-10-CM

## 2022-10-30 DIAGNOSIS — K7460 Unspecified cirrhosis of liver: Secondary | ICD-10-CM

## 2022-10-30 DIAGNOSIS — K862 Cyst of pancreas: Secondary | ICD-10-CM

## 2022-10-30 NOTE — Telephone Encounter (Signed)
MRI Received: 3 days ago Gillermina Hu, RN  Gillermina Hu, RN Gatha Mayer, MD  Gillermina Hu, RN MRI shows stable liver lesion and a stable pancreas cyst and a much improved pancreas cyst  Plan:  1) Repeat MRI in 1 year - place remonder 2) schedule non-urgent f/u me please  11/27/2021

## 2022-10-31 ENCOUNTER — Other Ambulatory Visit: Payer: Self-pay | Admitting: Internal Medicine

## 2022-11-08 ENCOUNTER — Other Ambulatory Visit: Payer: Self-pay

## 2022-11-08 DIAGNOSIS — K769 Liver disease, unspecified: Secondary | ICD-10-CM

## 2022-11-08 DIAGNOSIS — K862 Cyst of pancreas: Secondary | ICD-10-CM

## 2022-11-08 NOTE — Telephone Encounter (Signed)
Personal reminder was received in Epic: Pt made aware of Dr. Carlean Purl recommendations to repeat MRI in January: Order was placed for MRI to be done at [place]: Pt made aware: Pt notified that they will contact her with date and time: Pt verbalized understanding with all questions answered.

## 2022-11-30 ENCOUNTER — Telehealth: Payer: Self-pay

## 2022-11-30 ENCOUNTER — Other Ambulatory Visit: Payer: Self-pay | Admitting: Internal Medicine

## 2022-12-10 ENCOUNTER — Ambulatory Visit
Admission: RE | Admit: 2022-12-10 | Discharge: 2022-12-10 | Disposition: A | Discharge status: Home / Self Care | Payer: MEDICARE | Source: Ambulatory Visit | Attending: Internal Medicine | Admitting: Internal Medicine

## 2022-12-10 DIAGNOSIS — K862 Cyst of pancreas: Secondary | ICD-10-CM

## 2022-12-10 DIAGNOSIS — K769 Liver disease, unspecified: Secondary | ICD-10-CM

## 2022-12-10 MED ORDER — GADOPICLENOL 0.5 MMOL/ML IV SOLN
9.0000 mL | Freq: Once | INTRAVENOUS | Status: AC | PRN
  Administered 2022-12-10: 9 mL via INTRAVENOUS

## 2022-12-12 ENCOUNTER — Other Ambulatory Visit: Payer: Self-pay | Admitting: Internal Medicine

## 2022-12-12 ENCOUNTER — Telehealth: Payer: Self-pay | Admitting: Internal Medicine

## 2022-12-12 DIAGNOSIS — K55069 Acute infarction of intestine, part and extent unspecified: Secondary | ICD-10-CM

## 2022-12-12 DIAGNOSIS — R935 Abnormal findings on diagnostic imaging of other abdominal regions, including retroperitoneum: Secondary | ICD-10-CM

## 2022-12-12 NOTE — Telephone Encounter (Signed)
Patient returned your call I advised her of your message below and she understood and wrote down her appt date for CT.

## 2022-12-12 NOTE — Telephone Encounter (Signed)
Reviewed MRI results with patient.  There is a question of a superior mesenteric vein nonocclusive thrombus and radiology has recommended a CT venogram.  I have called the patient about this and she understands we will be contacting her about this.  I have pended an order for the CT venogram.  I think it is ok except for location of exam - she might want [facility] as she lives in [place]

## 2022-12-12 NOTE — Telephone Encounter (Signed)
Contacted pt. Discussed location of CT that she request.  Pt requested [facility]  Order completed for CT scan: Pt scheduled for 12/27/2022 at 2:30 PM. Pt to arrive at 2:15 PM    No solids 4 hours prior, Can drink liquids only: Left message for pt to call back

## 2022-12-13 NOTE — Telephone Encounter (Signed)
Pt contacted to review details of CT scan  Pt scheduled  CT at [facility] for 12/27/2022 at 2:30 PM. Pt to arrive at 2:15 PM    No solids 4 hours prior, Can drink liquids only: Pt made aware Pt verbalized understanding with all questions answered.

## 2022-12-27 ENCOUNTER — Ambulatory Visit (HOSPITAL_COMMUNITY)
Admission: RE | Admit: 2022-12-27 | Discharge: 2022-12-27 | Disposition: A | Discharge status: Home / Self Care | Payer: MEDICARE | Source: Ambulatory Visit | Attending: Internal Medicine | Admitting: Internal Medicine

## 2022-12-27 DIAGNOSIS — K55069 Acute infarction of intestine, part and extent unspecified: Secondary | ICD-10-CM | POA: Insufficient documentation

## 2022-12-27 DIAGNOSIS — R935 Abnormal findings on diagnostic imaging of other abdominal regions, including retroperitoneum: Secondary | ICD-10-CM | POA: Diagnosis present

## 2022-12-27 MED ORDER — IOHEXOL 300 MG/ML  SOLN: 125.0000 mL | Freq: Once | INTRAMUSCULAR | Status: DC | PRN

## 2022-12-27 MED ORDER — IOHEXOL 350 MG/ML SOLN
125.0000 mL | Freq: Once | INTRAVENOUS | Status: AC | PRN
  Administered 2022-12-27: 125 mL via INTRAVENOUS

## 2022-12-27 NOTE — Telephone Encounter (Signed)
error 

## 2022-12-31 ENCOUNTER — Other Ambulatory Visit: Payer: Self-pay | Admitting: Internal Medicine

## 2022-12-31 NOTE — Telephone Encounter (Signed)
Please advise Sir, thank you. 

## 2022-12-31 NOTE — Telephone Encounter (Signed)
Rx refilled She will need Ov me April Do we know if she got an appointment w/ her heme-onc MD in [place] re: SMV thrombosis and needs anti-coagulation?

## 2022-12-31 NOTE — Telephone Encounter (Signed)
Spoke with Dr. Jodelle Green office and fax sent with recent MRI and CT scan to be reviewed by Dr. Jodelle Green to advise on appointment for evaluation and treatment for blood clot:

## 2023-01-02 ENCOUNTER — Telehealth: Payer: Self-pay | Admitting: Internal Medicine

## 2023-01-02 NOTE — Telephone Encounter (Signed)
Spoke with Pt daughter [name] . Documented in different phone note.  Pt daughter [name]  verbalized understanding with all questions answered.

## 2023-01-02 NOTE — Telephone Encounter (Signed)
Patient's daughter is calling wondering about getting patient scheduled for an oncology appointment. Please advise

## 2023-01-09 ENCOUNTER — Encounter: Payer: Self-pay | Admitting: Internal Medicine

## 2023-01-10 ENCOUNTER — Telehealth: Payer: Self-pay

## 2023-01-10 NOTE — Telephone Encounter (Signed)
I left Cristina Santos a detailed message to please call us and set up an appointment.

## 2023-01-10 NOTE — Telephone Encounter (Signed)
-----   Message from Gatha Mayer, MD sent at 01/09/2023  5:17 PM EST ----- Regarding: Needs appointment Please get her on March or April appointment with me to follow-up cirrhosis

## 2023-01-10 NOTE — Telephone Encounter (Signed)
I was able to reach her daughter Coralyn Mark and we sat up an appointment so that she can bring Mom on April 12th.

## 2023-03-01 ENCOUNTER — Ambulatory Visit (INDEPENDENT_AMBULATORY_CARE_PROVIDER_SITE_OTHER)
Admission: RE | Admit: 2023-03-01 | Discharge: 2023-03-01 | Disposition: A | Discharge status: Home / Self Care | Payer: MEDICARE | Source: Ambulatory Visit | Attending: Internal Medicine | Admitting: Internal Medicine

## 2023-03-01 ENCOUNTER — Other Ambulatory Visit: Payer: Self-pay | Admitting: Internal Medicine

## 2023-03-01 ENCOUNTER — Other Ambulatory Visit (INDEPENDENT_AMBULATORY_CARE_PROVIDER_SITE_OTHER): Payer: MEDICARE

## 2023-03-01 ENCOUNTER — Ambulatory Visit (INDEPENDENT_AMBULATORY_CARE_PROVIDER_SITE_OTHER): Payer: MEDICARE | Admitting: Internal Medicine

## 2023-03-01 ENCOUNTER — Encounter: Payer: Self-pay | Admitting: Internal Medicine

## 2023-03-01 VITALS — BP 110/70 | HR 72 | Ht 64.6 in | Wt 199.2 lb

## 2023-03-01 DIAGNOSIS — R21 Rash and other nonspecific skin eruption: Secondary | ICD-10-CM

## 2023-03-01 DIAGNOSIS — R600 Localized edema: Secondary | ICD-10-CM

## 2023-03-01 DIAGNOSIS — K7581 Nonalcoholic steatohepatitis (NASH): Secondary | ICD-10-CM

## 2023-03-01 DIAGNOSIS — K55069 Acute infarction of intestine, part and extent unspecified: Secondary | ICD-10-CM | POA: Diagnosis not present

## 2023-03-01 DIAGNOSIS — K7460 Unspecified cirrhosis of liver: Secondary | ICD-10-CM | POA: Diagnosis not present

## 2023-03-01 DIAGNOSIS — R079 Chest pain, unspecified: Secondary | ICD-10-CM

## 2023-03-01 DIAGNOSIS — K769 Liver disease, unspecified: Secondary | ICD-10-CM | POA: Insufficient documentation

## 2023-03-01 DIAGNOSIS — R490 Dysphonia: Secondary | ICD-10-CM

## 2023-03-01 DIAGNOSIS — K862 Cyst of pancreas: Secondary | ICD-10-CM

## 2023-03-01 DIAGNOSIS — R0609 Other forms of dyspnea: Secondary | ICD-10-CM

## 2023-03-01 LAB — COMPREHENSIVE METABOLIC PANEL
ALT: 15 U/L (ref 0–35)
AST: 18 U/L (ref 0–37)
Albumin: 4.1 g/dL (ref 3.5–5.2)
Alkaline Phosphatase: 83 U/L (ref 39–117)
BUN: 13 mg/dL (ref 6–23)
CO2: 28 mEq/L (ref 19–32)
Calcium: 9.4 mg/dL (ref 8.4–10.5)
Chloride: 99 mEq/L (ref 96–112)
Creatinine, Ser: 0.81 mg/dL (ref 0.40–1.20)
GFR: 67.1 mL/min (ref >60.00)
Glucose, Bld: 93 mg/dL (ref 70–99)
Potassium: 4.5 mEq/L (ref 3.5–5.1)
Sodium: 132 mEq/L — ABNORMAL LOW (ref 135–145)
Total Bilirubin: 0.9 mg/dL (ref 0.2–1.2)
Total Protein: 6.6 g/dL (ref 6.0–8.3)

## 2023-03-01 LAB — CBC
HCT: 31.3 % — ABNORMAL LOW (ref 36.0–46.0)
Hemoglobin: 10.4 g/dL — ABNORMAL LOW (ref 12.0–15.0)
MCHC: 33.1 g/dL (ref 30.0–36.0)
MCV: 81.2 fl (ref 78.0–100.0)
Platelets: 145 10*3/uL — ABNORMAL LOW (ref 150.0–400.0)
RBC: 3.85 Mil/uL — ABNORMAL LOW (ref 3.87–5.11)
RDW: 16 % — ABNORMAL HIGH (ref 11.5–15.5)
WBC: 4.4 10*3/uL (ref 4.0–10.5)

## 2023-03-01 LAB — BRAIN NATRIURETIC PEPTIDE: Pro B Natriuretic peptide (BNP): 192 pg/mL — ABNORMAL HIGH (ref 0.0–100.0)

## 2023-03-01 MED ORDER — SPIRONOLACTONE 25 MG PO TABS: 25.0000 mg | ORAL_TABLET | Freq: Every day | ORAL | 0 refills | Status: DC

## 2023-03-01 NOTE — Progress Notes (Addendum)
Cristina Santos 82 y.o. 1941-07-29 [id number]  Assessment & Plan:   Encounter Diagnoses  Name Primary?   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  We have ordered an echocardiogram and will consult cardiology.  If she gets more than fleeting chest pain and other worrisome features which I reviewed with her she is to call 911.    Considering starting spironolactone await lab evaluation.  I cannot make a link between this rash and her liver problems unless it is related to the edema she is having which could be related to her cirrhosis but cardiac issues may be in play.  She has sleep apnea and is on CPAP, I do not see dilated neck veins but she does have clear lung fields and some edema so she could have right heart failure.  Volume overload from cirrhosis is possible as well.  Obesity hypoventilation syndrome also possible.  Orders Placed This Encounter  Procedures   DG Chest 2 View   Brain natriuretic peptide   Comp Met (CMET)   CBC   Ambulatory referral to Cardiology   ECHOCARDIOGRAM COMPLETE   Pending this evaluation consider repeat CT venogram.  Continue anticoagulation.   Regarding her hoarseness I reassured her as best I can again, she has been on twice daily PPI in the past she has seen ENT and we do not have a clear-cut etiology.  Daughter wonders about speech therapy and I think that would be reasonable although I think she has got some other issues to deal with that are higher priority right now.  CC: Cristina Deem, MD Dr. Regan Lemming   Subjective:   Chief Complaint: Cirrhosis follow-up  HPI82 year old white woman with MASH related  cirrhosis, history of ascites and 1+ varices and a remote history of colon cancer resected in 1988, presents for follow-up after being diagnosed with superior mesenteric vein thrombosis through MRI follow-up of liver lesion. She is here with her nurse practitioner daughter, and active problems include increasing dyspnea on exertion, 2 episodes of vague dull chest pain that occurred in the evenings 1 that awakened her from sleep, lower extremity edema and a persistent irritating rash in the lower extremities.  She is also still complaining of chronic hoarseness issues and is frustrated by that because it affects her voice and interactions with people and ability to sing.  After she was diagnosed with a superior mesenteric vein thrombosis I had her go see her hematologist to hide continue to follow her because she  kept a Port-A-Cath in since colon cancer treatment.Dr. Regan Lemming is the physician and he started her on Xarelto.  He told her that if the SMV occluded that that would be terminal so that has scared her somewhat.  This doctor and her primary care doctor tell her that her lower extremity rash and itching is related to her cirrhosis.  She is not on any diuretics.  She remains on nadolol for variceal prophylaxis.  She is asking about when we would repeat CT venogram. Also wondering about what she can take for constipation which she gets occasionally because lactulose has not  helped.  She had a little bit of confusion at 1 point we wondered about encephalopathy but think not.  MRI abdomen with and without contrast 12/11/2022 IMPRESSION: 1. Stable appearance of lesion in the dome of the RIGHT hemiliver, hepatic subsegment VII. No corresponding T2 abnormality or diffusion abnormality though this area is confounded by presence of adjacent lung. Findings favoring benign lesion such as a small vascular shunt. LI-RADS category 3 on previous imaging, given 2 years stability lack of visibility on later phase  with blood pool signal on earlier phase images categorized as LI-RADS category 2. 2. Cirrhosis and splenomegaly. 3. Eccentric filling defect in SMV is persistent across many sequences and is suspicious for nonocclusive thrombus just below the splenic portal confluence. Consider CT venogram for confirmation. 4. Lesion in the posterior pancreatic head is 12 x 8 mm is within 1 mm of its size in May 2022. Other cystic lesions in the pancreas, for instance in the genu of the pancreas now 5 mm previously greater than a cm in May of 2022. Could consider follow-up at 2 years as warranted in a patient of this age.   CT venogram 12/27/2022 IMPRESSION: 1. Exam is positive for subocclusive thrombus within the SMV beginning in the jejunal branches and extending into the main SMV nearly to the portal splenic confluence. 2. No extension of thrombus into the portal venous system. 3. Mildly variant anatomy. The IMV joins the SMV at the portal splenic confluence rather than draining into the splenic vein directly. 4. Hepatic cirrhosis complicated by portal hypertension manifested as splenomegaly and small esophageal varices. 5. Colonic diverticular disease without CT evidence of active inflammation. 6. Multilevel degenerative disc disease. 7.  Aortic Atherosclerosis (ICD10-I70.0). Wt Readings from Last 3 Encounters:  03/01/23 199 lb 4 oz (90.4 kg)  09/07/22 193 lb 8 oz (87.8 kg)  01/01/22 200 lb (90.7 kg)     Allergies  Allergen Reactions   Latex Anaphylaxis   Codeine Palpitations   Current Meds  Medication Sig   ascorbic acid (VITAMIN C) 500 MG tablet Take 500 mg by mouth daily.   Biotin 1000 MCG tablet Take 1,000 mcg by mouth daily.   Cholecalciferol 50 MCG (2000 UT) TABS Take 2,000 Units by mouth daily.   diphenhydrAMINE-APAP, sleep, (TYLENOL PM EXTRA STRENGTH PO) Take 1 tablet by mouth as needed.   levothyroxine (SYNTHROID) 75 MCG tablet Take 75 mcg by mouth daily before breakfast.     magnesium 30 MG tablet Take 30 mg by mouth at bedtime.   Multiple Vitamins-Minerals (PRESERVISION AREDS PO) Take 1 tablet by mouth daily.   nadolol (CORGARD) 20 MG tablet Take 1 tablet by mouth once daily   pantoprazole (PROTONIX) 40 MG tablet Take 40 mg by mouth daily.   pramipexole (MIRAPEX) 0.75 MG tablet Take 0.75 mg by mouth daily.   vitamin B-12 (CYANOCOBALAMIN) 1000 MCG tablet Take 1,000 mcg by mouth daily.   XARELTO 20 MG TABS tablet Take 20 mg by mouth daily.   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 10/09/2020   Pancreatic cysts 10/09/2020   Portal hypertension (HCC)-gastropathy and colopathy 10/09/2020   Sleep apnea    Thrombosis, hepatic vein  SMV   Thyroid disease    Vestibular migraine    Past Surgical History:  Procedure Laterality Date   ABDOMINAL HYSTERECTOMY  1987   COLON RESECTION     COLONOSCOPY     PORTACATH PLACEMENT     Never removed placed at time of colon cancer treatment   SHOULDER ARTHROSCOPY     left   TOTAL KNEE ARTHROPLASTY Left 06/08/2019   Procedure: TOTAL KNEE ARTHROPLASTY;  Surgeon: Ollen Gross, MD;  Location: [facility];  Service: Orthopedics;  Laterality: Left;    UPPER GASTROINTESTINAL ENDOSCOPY     Social History   Social History Narrative   Married, 1 son one daughter. Daughter is a Publishing rights manager.   She is retired   2 cups coffee per day   family history includes Alzheimer's disease in her mother; Bone cancer in her brother and brother; Diabetes in her brother and mother; Heart attack in her brother; Heart disease in her brother; Lung cancer in her brother, brother, and sister; Prostate cancer in her brother; Ulcerative colitis in  her sister.   Review of Systems See HPI  Objective:   Physical Exam @BP  110/70 (BP Location: Left Arm, Patient Position: Sitting, Cuff Size: Normal)   Pulse 72   Ht 5' 4.6" (1.641 m)   Wt 199 lb 4 oz (90.4 kg)   BMI 33.57 kg/m @  General:  Obese well-nourished and in no acute distress Eyes:  anicteric. Neck:   supple w/o thyromegaly or mass.  Lungs: Clear to auscultation bilaterally. Heart:   S1S2, no rubs, murmurs, gallops. No JVD Abdomen:  Obese, soft, non-tender, no hepatosplenomegaly, hernia, or mass and BS+.   Lymph:  no cervical or supraclavicular adenopathy. Extremities:   Trace bilat LE edema, no cyanosis or clubbing Skin   Erythematous maculopapular rase pretibial skin Neuro:  A&O x 3.  Psych:  appropriate mood and  Affect.   Data Reviewed: See HPI

## 2023-03-01 NOTE — Patient Instructions (Addendum)
Your provider has requested that you go to the basement level for lab work before leaving today. Press "B" on the elevator. The lab is located at the first door on the left as you exit the elevator.  Due to recent changes in healthcare laws, you may see the results of your imaging and laboratory studies on MyChart before your provider has had a chance to review them.  We understand that in some cases there may be results that are confusing or concerning to you. Not all laboratory results come back in the same time frame and the provider may be waiting for multiple results in order to interpret others.  Please give [place] 48 hours in order for your provider to thoroughly review all the results before contacting the office for clarification of your results.   Please stop by the x-ray department in the basement and do a Chest x-ray before leaving today.  We have placed a referral to [facility]. They will contact you about an appointment.   You have been set up for an Echocardiogram at [facility] on 04/02/2023 at 1:00pm, arrive at 12:45pm.   I appreciate the opportunity to care for you. Stan Head, MD, [facility]

## 2023-03-05 ENCOUNTER — Other Ambulatory Visit: Payer: Self-pay

## 2023-03-05 DIAGNOSIS — K7460 Unspecified cirrhosis of liver: Secondary | ICD-10-CM

## 2023-04-02 ENCOUNTER — Ambulatory Visit (HOSPITAL_COMMUNITY)
Admission: RE | Admit: 2023-04-02 | Discharge: 2023-04-02 | Disposition: A | Discharge status: Home / Self Care | Payer: MEDICARE | Source: Ambulatory Visit | Attending: Internal Medicine | Admitting: Internal Medicine

## 2023-04-02 DIAGNOSIS — R079 Chest pain, unspecified: Secondary | ICD-10-CM | POA: Diagnosis present

## 2023-04-02 DIAGNOSIS — R0609 Other forms of dyspnea: Secondary | ICD-10-CM | POA: Diagnosis present

## 2023-04-02 LAB — ECHOCARDIOGRAM COMPLETE
Area-P 1/2: 3.85 cm2
S' Lateral: 2.7 cm

## 2023-04-02 NOTE — Progress Notes (Signed)
  Echocardiogram 2D Echocardiogram has been performed.  Maren Reamer 04/02/2023, 1:58 PM

## 2023-04-10 ENCOUNTER — Encounter: Payer: Self-pay | Admitting: Cardiology

## 2023-04-10 ENCOUNTER — Ambulatory Visit: Payer: MEDICARE | Attending: Cardiology | Admitting: Cardiology

## 2023-04-10 VITALS — BP 136/66 | HR 68 | Ht 65.0 in | Wt 197.8 lb

## 2023-04-10 DIAGNOSIS — K7460 Unspecified cirrhosis of liver: Secondary | ICD-10-CM | POA: Diagnosis present

## 2023-04-10 DIAGNOSIS — R0789 Other chest pain: Secondary | ICD-10-CM | POA: Insufficient documentation

## 2023-04-10 DIAGNOSIS — K7581 Nonalcoholic steatohepatitis (NASH): Secondary | ICD-10-CM | POA: Diagnosis present

## 2023-04-10 DIAGNOSIS — R079 Chest pain, unspecified: Secondary | ICD-10-CM | POA: Diagnosis not present

## 2023-04-10 DIAGNOSIS — Z79899 Other long term (current) drug therapy: Secondary | ICD-10-CM | POA: Insufficient documentation

## 2023-04-10 MED ORDER — FUROSEMIDE 20 MG PO TABS: 20.0000 mg | ORAL_TABLET | Freq: Every day | ORAL | 3 refills | Status: DC

## 2023-04-10 NOTE — Patient Instructions (Addendum)
Medication Instructions:  Your physician has recommended you make the following change in your medication: Furosemide 20 mg daily. Continue all other medications the same.   Labwork: BMET and Magnesium in 2 weeks at [facility]  Testing/Procedures: None  Follow-Up: Your physician recommends that you schedule a follow-up appointment in: 6 week follow up with Sharlene Dory  Any Other Special Instructions Will Be Listed Below (If Applicable).  If you need a refill on your cardiac medications before your next appointment, please call your pharmacy.

## 2023-04-10 NOTE — Progress Notes (Signed)
Clinical Summary Cristina Santos is a 82 y.o.female seen today as a new consult, referred by Dr Leone Payor for the following medical problems.    1.Chest pain/DOE - SOB started about 6 months. Just walking room to room - some recent LE edema. Some wheezing at times - breathing better with leaning up than laying flat  - chest pain more recently - pressure midchest 5/10 in severity. Lasts few seconds. Feel anxious, SOB.  03/2023 echo: LVEF 60-65%, no WMAs, grade II dd, normal RV, normal PASP, RA pressure estimated 8 mmHg - not able to run on treadmill due to chronic knee pains    2.Liver cirrhossi/NASH   3. 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 90 tablet 0   albuterol (VENTOLIN HFA) 108 (90 Base) MCG/ACT inhaler Inhale 1 puff into the lungs as needed. (Patient not taking: Reported on 03/01/2023)     ascorbic acid (VITAMIN C) 500 MG tablet Take 500 mg by mouth daily.     Biotin 1000 MCG tablet Take 1,000 mcg by mouth daily.     Cholecalciferol 50 MCG (2000 UT) TABS Take 2,000 Units by mouth daily.     diphenhydrAMINE-APAP, sleep, (TYLENOL PM EXTRA  STRENGTH PO) Take 1 tablet by mouth as needed.     lactulose (CHRONULAC) 10 GM/15ML solution Take 10 g by mouth as needed. (Patient not taking: Reported on 03/01/2023)     levothyroxine (SYNTHROID) 75 MCG tablet Take 75 mcg by mouth daily before breakfast.      magnesium 30 MG tablet Take 30 mg by mouth at bedtime.     Multiple Vitamins-Minerals (PRESERVISION AREDS PO) Take 1 tablet by mouth daily.     nadolol (CORGARD) 20 MG tablet Take 1 tablet by mouth once daily 30 tablet 3   pantoprazole (PROTONIX) 40 MG tablet Take 40 mg by mouth daily.     pramipexole (MIRAPEX) 0.75 MG tablet Take 0.75 mg by mouth daily.     topiramate (TOPAMAX) 50 MG tablet Take 50 mg by mouth as needed. (Patient not taking: Reported on 03/01/2023)     vitamin B-12 (CYANOCOBALAMIN) 1000 MCG tablet Take 1,000 mcg by mouth daily.     XARELTO 20 MG TABS tablet Take 20 mg by mouth daily.     No current facility-administered medications for this visit.     Past Surgical History:  Procedure Laterality Date   ABDOMINAL HYSTERECTOMY  1987   COLON RESECTION     COLONOSCOPY     PORTACATH PLACEMENT     Never removed placed at time of colon cancer treatment  SHOULDER ARTHROSCOPY     left   TOTAL KNEE ARTHROPLASTY Left 06/08/2019   Procedure: TOTAL KNEE ARTHROPLASTY;  Surgeon: Ollen Gross, MD;  Location: [facility];  Service: Orthopedics;  Laterality: Left;    UPPER GASTROINTESTINAL ENDOSCOPY       Allergies  Allergen Reactions   Latex Anaphylaxis   Codeine Palpitations      Family History  Problem Relation Age of Onset   Prostate cancer Brother    Lung cancer Brother    Heart disease Brother    Bone cancer Brother    Diabetes Mother    Alzheimer's disease Mother    Diabetes Brother    Heart attack Brother    Lung cancer Brother    Bone cancer Brother    Lung cancer Sister        with metz    Ulcerative colitis Sister    Colon cancer Neg Hx    Esophageal cancer Neg Hx    Stomach cancer Neg Hx     Pancreatic cancer Neg Hx    AAA (abdominal aortic aneurysm) Neg Hx      Social History Cristina Santos reports that she has never smoked. She has never used smokeless tobacco. Cristina Santos reports no history of alcohol use.   Review of Systems CONSTITUTIONAL: No weight loss, fever, chills, weakness or fatigue.  HEENT: Eyes: No visual loss, blurred vision, double vision or yellow sclerae.No hearing loss, sneezing, congestion, runny nose or sore throat.  SKIN: No rash or itching.  CARDIOVASCULAR: per hpi RESPIRATORY: per hpi GASTROINTESTINAL: No anorexia, nausea, vomiting or diarrhea. No abdominal pain or blood.  GENITOURINARY: No burning on urination, no polyuria NEUROLOGICAL: No headache, dizziness, syncope, paralysis, ataxia, numbness or tingling in the extremities. No change in bowel or bladder control.  MUSCULOSKELETAL: No muscle, back pain, joint pain or stiffness.  LYMPHATICS: No enlarged nodes. No history of splenectomy.  PSYCHIATRIC: No history of depression or anxiety.  ENDOCRINOLOGIC: No reports of sweating, cold or heat intolerance. No polyuria or polydipsia.  Marland Kitchen   Physical Examination Today's Vitals   04/10/23 1514  BP: 136/66  Pulse: 68  SpO2: 98%  Weight: 197 lb 12.8 oz (89.7 kg)  Height: 5\' 5"  (1.651 m)   Body mass index is 32.92 kg/m.  Gen: resting comfortably, no acute distress HEENT: no scleral icterus, pupils equal round and reactive, no palptable cervical adenopathy,  CV: RRR, no mrg, no jvd Resp: mild crackles bilaterally GI: abdomen is soft, non-tender, non-distended, normal bowel sounds, no hepatosplenomegaly MSK: extremities are warm,trace bilateral edema Skin: warm, no rash Neuro:  no focal deficits Psych: appropriate affect     Assessment and Plan  1.SOB/Chest pain - echo LVEF 60-65%, grade II dd - CXR was clear, mildly elevated BNP perhaps understated given diastolic dysfunction and obesity - signs of some fluid overload by exam, start lasix 20mg   daily with bmet/mg in 2 weeks - symptoms may be purely to diastolic HF. We did discuss a possible stress test for chest pain and DOE, she favors holding off and treating fluid initially. If ongoing symptoms depsite diuresis would consider lexiscan. Cannot run on treadmill due to chronic knee pains   EKG today shows SR with PACs, no ischemic changes   F/u 6 weeks   Antoine Poche, M.D.

## 2023-04-24 LAB — MAGNESIUM: Magnesium: 2 mg/dL (ref 1.6–2.3)

## 2023-04-24 LAB — BASIC METABOLIC PANEL
BUN/Creatinine Ratio: 19 (ref 12–28)
BUN: 20 mg/dL (ref 8–27)
CO2: 25 mmol/L (ref 20–29)
Calcium: 9.1 mg/dL (ref 8.7–10.3)
Chloride: 97 mmol/L (ref 96–106)
Creatinine, Ser: 1.05 mg/dL — ABNORMAL HIGH (ref 0.57–1.00)
Glucose: 134 mg/dL — ABNORMAL HIGH (ref 70–99)
Potassium: 4.5 mmol/L (ref 3.5–5.2)
Sodium: 135 mmol/L (ref 134–144)
eGFR: 53 mL/min/{1.73_m2} — ABNORMAL LOW (ref >59)

## 2023-04-26 ENCOUNTER — Other Ambulatory Visit: Payer: Self-pay | Admitting: Internal Medicine

## 2023-04-26 NOTE — Telephone Encounter (Signed)
Please advise Sir, thank you. 

## 2023-05-04 ENCOUNTER — Emergency Department (HOSPITAL_COMMUNITY): Payer: MEDICARE

## 2023-05-04 ENCOUNTER — Telehealth: Payer: Self-pay | Admitting: Cardiology

## 2023-05-04 ENCOUNTER — Encounter (HOSPITAL_COMMUNITY): Payer: Self-pay | Admitting: Emergency Medicine

## 2023-05-04 ENCOUNTER — Inpatient Hospital Stay (HOSPITAL_COMMUNITY)
Admission: EM | Admit: 2023-05-04 | Discharge: 2023-05-06 | DRG: 811 | Disposition: A | Discharge status: Home / Self Care | Payer: MEDICARE | Attending: Internal Medicine | Admitting: Internal Medicine

## 2023-05-04 ENCOUNTER — Other Ambulatory Visit: Payer: Self-pay

## 2023-05-04 DIAGNOSIS — I2510 Atherosclerotic heart disease of native coronary artery without angina pectoris: Secondary | ICD-10-CM | POA: Diagnosis present

## 2023-05-04 DIAGNOSIS — K5520 Angiodysplasia of colon without hemorrhage: Secondary | ICD-10-CM | POA: Diagnosis present

## 2023-05-04 DIAGNOSIS — K7581 Nonalcoholic steatohepatitis (NASH): Secondary | ICD-10-CM | POA: Diagnosis present

## 2023-05-04 DIAGNOSIS — D6959 Other secondary thrombocytopenia: Secondary | ICD-10-CM | POA: Diagnosis present

## 2023-05-04 DIAGNOSIS — K648 Other hemorrhoids: Secondary | ICD-10-CM | POA: Diagnosis not present

## 2023-05-04 DIAGNOSIS — I110 Hypertensive heart disease with heart failure: Secondary | ICD-10-CM | POA: Diagnosis present

## 2023-05-04 DIAGNOSIS — Z833 Family history of diabetes mellitus: Secondary | ICD-10-CM

## 2023-05-04 DIAGNOSIS — K5730 Diverticulosis of large intestine without perforation or abscess without bleeding: Secondary | ICD-10-CM | POA: Diagnosis present

## 2023-05-04 DIAGNOSIS — D689 Coagulation defect, unspecified: Secondary | ICD-10-CM | POA: Diagnosis present

## 2023-05-04 DIAGNOSIS — D72819 Decreased white blood cell count, unspecified: Secondary | ICD-10-CM | POA: Diagnosis not present

## 2023-05-04 DIAGNOSIS — J90 Pleural effusion, not elsewhere classified: Secondary | ICD-10-CM | POA: Insufficient documentation

## 2023-05-04 DIAGNOSIS — R195 Other fecal abnormalities: Principal | ICD-10-CM

## 2023-05-04 DIAGNOSIS — K55059 Acute (reversible) ischemia of intestine, part and extent unspecified: Secondary | ICD-10-CM | POA: Diagnosis present

## 2023-05-04 DIAGNOSIS — K2970 Gastritis, unspecified, without bleeding: Secondary | ICD-10-CM | POA: Diagnosis present

## 2023-05-04 DIAGNOSIS — K3189 Other diseases of stomach and duodenum: Secondary | ICD-10-CM | POA: Diagnosis present

## 2023-05-04 DIAGNOSIS — D122 Benign neoplasm of ascending colon: Secondary | ICD-10-CM | POA: Diagnosis not present

## 2023-05-04 DIAGNOSIS — G4733 Obstructive sleep apnea (adult) (pediatric): Secondary | ICD-10-CM | POA: Diagnosis present

## 2023-05-04 DIAGNOSIS — K219 Gastro-esophageal reflux disease without esophagitis: Secondary | ICD-10-CM | POA: Diagnosis present

## 2023-05-04 DIAGNOSIS — I8510 Secondary esophageal varices without bleeding: Secondary | ICD-10-CM | POA: Diagnosis present

## 2023-05-04 DIAGNOSIS — K7290 Hepatic failure, unspecified without coma: Secondary | ICD-10-CM

## 2023-05-04 DIAGNOSIS — Z7901 Long term (current) use of anticoagulants: Secondary | ICD-10-CM

## 2023-05-04 DIAGNOSIS — Z808 Family history of malignant neoplasm of other organs or systems: Secondary | ICD-10-CM

## 2023-05-04 DIAGNOSIS — D500 Iron deficiency anemia secondary to blood loss (chronic): Secondary | ICD-10-CM | POA: Diagnosis present

## 2023-05-04 DIAGNOSIS — R791 Abnormal coagulation profile: Secondary | ICD-10-CM | POA: Insufficient documentation

## 2023-05-04 DIAGNOSIS — K55069 Acute infarction of intestine, part and extent unspecified: Secondary | ICD-10-CM

## 2023-05-04 DIAGNOSIS — D696 Thrombocytopenia, unspecified: Secondary | ICD-10-CM

## 2023-05-04 DIAGNOSIS — G2581 Restless legs syndrome: Secondary | ICD-10-CM | POA: Diagnosis present

## 2023-05-04 DIAGNOSIS — D62 Acute posthemorrhagic anemia: Secondary | ICD-10-CM | POA: Diagnosis present

## 2023-05-04 DIAGNOSIS — K317 Polyp of stomach and duodenum: Secondary | ICD-10-CM | POA: Diagnosis present

## 2023-05-04 DIAGNOSIS — Z85038 Personal history of other malignant neoplasm of large intestine: Secondary | ICD-10-CM

## 2023-05-04 DIAGNOSIS — E669 Obesity, unspecified: Secondary | ICD-10-CM | POA: Diagnosis present

## 2023-05-04 DIAGNOSIS — I4892 Unspecified atrial flutter: Secondary | ICD-10-CM | POA: Diagnosis not present

## 2023-05-04 DIAGNOSIS — I8500 Esophageal varices without bleeding: Secondary | ICD-10-CM | POA: Diagnosis present

## 2023-05-04 DIAGNOSIS — Z801 Family history of malignant neoplasm of trachea, bronchus and lung: Secondary | ICD-10-CM

## 2023-05-04 DIAGNOSIS — K7460 Unspecified cirrhosis of liver: Secondary | ICD-10-CM | POA: Diagnosis present

## 2023-05-04 DIAGNOSIS — Z79899 Other long term (current) drug therapy: Secondary | ICD-10-CM

## 2023-05-04 DIAGNOSIS — Z8249 Family history of ischemic heart disease and other diseases of the circulatory system: Secondary | ICD-10-CM

## 2023-05-04 DIAGNOSIS — Z96652 Presence of left artificial knee joint: Secondary | ICD-10-CM | POA: Diagnosis present

## 2023-05-04 DIAGNOSIS — E039 Hypothyroidism, unspecified: Secondary | ICD-10-CM | POA: Insufficient documentation

## 2023-05-04 DIAGNOSIS — D123 Benign neoplasm of transverse colon: Secondary | ICD-10-CM | POA: Diagnosis not present

## 2023-05-04 DIAGNOSIS — D649 Anemia, unspecified: Secondary | ICD-10-CM

## 2023-05-04 DIAGNOSIS — R0602 Shortness of breath: Secondary | ICD-10-CM

## 2023-05-04 DIAGNOSIS — Z820 Family history of epilepsy and other diseases of the nervous system: Secondary | ICD-10-CM

## 2023-05-04 DIAGNOSIS — E871 Hypo-osmolality and hyponatremia: Secondary | ICD-10-CM | POA: Diagnosis present

## 2023-05-04 DIAGNOSIS — K922 Gastrointestinal hemorrhage, unspecified: Secondary | ICD-10-CM | POA: Insufficient documentation

## 2023-05-04 DIAGNOSIS — Z6832 Body mass index (BMI) 32.0-32.9, adult: Secondary | ICD-10-CM

## 2023-05-04 DIAGNOSIS — R188 Other ascites: Secondary | ICD-10-CM | POA: Diagnosis present

## 2023-05-04 DIAGNOSIS — Z885 Allergy status to narcotic agent status: Secondary | ICD-10-CM

## 2023-05-04 DIAGNOSIS — E1165 Type 2 diabetes mellitus with hyperglycemia: Secondary | ICD-10-CM | POA: Diagnosis present

## 2023-05-04 DIAGNOSIS — K766 Portal hypertension: Secondary | ICD-10-CM | POA: Diagnosis present

## 2023-05-04 DIAGNOSIS — D126 Benign neoplasm of colon, unspecified: Secondary | ICD-10-CM | POA: Diagnosis not present

## 2023-05-04 DIAGNOSIS — I480 Paroxysmal atrial fibrillation: Secondary | ICD-10-CM | POA: Diagnosis present

## 2023-05-04 DIAGNOSIS — Z79890 Hormone replacement therapy (postmenopausal): Secondary | ICD-10-CM

## 2023-05-04 DIAGNOSIS — I868 Varicose veins of other specified sites: Secondary | ICD-10-CM | POA: Diagnosis present

## 2023-05-04 DIAGNOSIS — I5033 Acute on chronic diastolic (congestive) heart failure: Secondary | ICD-10-CM | POA: Diagnosis present

## 2023-05-04 DIAGNOSIS — R9431 Abnormal electrocardiogram [ECG] [EKG]: Secondary | ICD-10-CM | POA: Diagnosis not present

## 2023-05-04 DIAGNOSIS — I5031 Acute diastolic (congestive) heart failure: Secondary | ICD-10-CM | POA: Diagnosis not present

## 2023-05-04 DIAGNOSIS — Z91040 Latex allergy status: Secondary | ICD-10-CM

## 2023-05-04 LAB — COMPREHENSIVE METABOLIC PANEL
ALT: 19 U/L (ref 0–44)
AST: 20 U/L (ref 15–41)
Albumin: 4 g/dL (ref 3.5–5.0)
Alkaline Phosphatase: 74 U/L (ref 38–126)
Anion gap: 8 (ref 5–15)
BUN: 14 mg/dL (ref 8–23)
CO2: 25 mmol/L (ref 22–32)
Calcium: 9.1 mg/dL (ref 8.9–10.3)
Chloride: 98 mmol/L (ref 98–111)
Creatinine, Ser: 0.84 mg/dL (ref 0.44–1.00)
GFR, Estimated: >60 mL/min (ref >60)
Glucose, Bld: 107 mg/dL — ABNORMAL HIGH (ref 70–99)
Potassium: 3.7 mmol/L (ref 3.5–5.1)
Sodium: 131 mmol/L — ABNORMAL LOW (ref 135–145)
Total Bilirubin: 1.4 mg/dL — ABNORMAL HIGH (ref 0.3–1.2)
Total Protein: 6.7 g/dL (ref 6.5–8.1)

## 2023-05-04 LAB — CBC WITH DIFFERENTIAL/PLATELET
Abs Immature Granulocytes: 0.01 10*3/uL (ref 0.00–0.07)
Basophils Absolute: 0 10*3/uL (ref 0.0–0.1)
Basophils Relative: 0 %
Eosinophils Absolute: 0.2 10*3/uL (ref 0.0–0.5)
Eosinophils Relative: 5 %
HCT: 29.2 % — ABNORMAL LOW (ref 36.0–46.0)
Hemoglobin: 8.9 g/dL — ABNORMAL LOW (ref 12.0–15.0)
Immature Granulocytes: 0 %
Lymphocytes Relative: 20 %
Lymphs Abs: 0.6 10*3/uL — ABNORMAL LOW (ref 0.7–4.0)
MCH: 24.7 pg — ABNORMAL LOW (ref 26.0–34.0)
MCHC: 30.5 g/dL (ref 30.0–36.0)
MCV: 80.9 fL (ref 80.0–100.0)
Monocytes Absolute: 0.2 10*3/uL (ref 0.1–1.0)
Monocytes Relative: 6 %
Neutro Abs: 2 10*3/uL (ref 1.7–7.7)
Neutrophils Relative %: 69 %
Platelets: 93 10*3/uL — ABNORMAL LOW (ref 150–400)
RBC: 3.61 MIL/uL — ABNORMAL LOW (ref 3.87–5.11)
RDW: 15.3 % (ref 11.5–15.5)
WBC: 2.9 10*3/uL — ABNORMAL LOW (ref 4.0–10.5)
nRBC: 0 % (ref 0.0–0.2)

## 2023-05-04 LAB — PROTIME-INR
INR: 3 — ABNORMAL HIGH (ref 0.8–1.2)
Prothrombin Time: 31.6 seconds — ABNORMAL HIGH (ref 11.4–15.2)

## 2023-05-04 LAB — TROPONIN I (HIGH SENSITIVITY)
Troponin I (High Sensitivity): 4 ng/L (ref <18)
Troponin I (High Sensitivity): 4 ng/L (ref <18)

## 2023-05-04 LAB — URINALYSIS, ROUTINE W REFLEX MICROSCOPIC
Bilirubin Urine: NEGATIVE
Glucose, UA: NEGATIVE mg/dL
Hgb urine dipstick: NEGATIVE
Ketones, ur: NEGATIVE mg/dL
Leukocytes,Ua: NEGATIVE
Nitrite: NEGATIVE
Protein, ur: NEGATIVE mg/dL
Specific Gravity, Urine: 1.003 — ABNORMAL LOW (ref 1.005–1.030)
pH: 7 (ref 5.0–8.0)

## 2023-05-04 LAB — POC OCCULT BLOOD, ED: Fecal Occult Bld: POSITIVE — AB

## 2023-05-04 LAB — AMMONIA: Ammonia: <10 umol/L (ref 9–35)

## 2023-05-04 LAB — BRAIN NATRIURETIC PEPTIDE: B Natriuretic Peptide: 246 pg/mL — ABNORMAL HIGH (ref 0.0–100.0)

## 2023-05-04 LAB — MAGNESIUM: Magnesium: 2 mg/dL (ref 1.7–2.4)

## 2023-05-04 MED ORDER — PANTOPRAZOLE SODIUM 40 MG IV SOLR: 40.0000 mg | Freq: Two times a day (BID) | INTRAVENOUS | Status: DC

## 2023-05-04 MED ORDER — PANTOPRAZOLE INFUSION (NEW) - SIMPLE MED
8.0000 mg/h | INTRAVENOUS | Status: DC
  Administered 2023-05-04 – 2023-05-06 (×4): 8 mg/h via INTRAVENOUS
  Filled 2023-05-04: qty 80
  Filled 2023-05-04 (×6): qty 100
  Filled 2023-05-04: qty 80

## 2023-05-04 MED ORDER — LEVOTHYROXINE SODIUM 75 MCG PO TABS
75.0000 ug | ORAL_TABLET | Freq: Every day | ORAL | Status: DC
  Administered 2023-05-06: 75 ug via ORAL
  Filled 2023-05-04 (×2): qty 1

## 2023-05-04 MED ORDER — PROCHLORPERAZINE EDISYLATE 10 MG/2ML IJ SOLN: 10.0000 mg | Freq: Four times a day (QID) | INTRAMUSCULAR | Status: DC | PRN

## 2023-05-04 MED ORDER — OCTREOTIDE LOAD VIA INFUSION
50.0000 ug | Freq: Once | INTRAVENOUS | Status: AC
  Administered 2023-05-04: 50 ug via INTRAVENOUS
  Filled 2023-05-04: qty 25

## 2023-05-04 MED ORDER — ALBUTEROL SULFATE (2.5 MG/3ML) 0.083% IN NEBU: 2.5000 mg | INHALATION_SOLUTION | RESPIRATORY_TRACT | Status: DC | PRN

## 2023-05-04 MED ORDER — NADOLOL 20 MG PO TABS
20.0000 mg | ORAL_TABLET | Freq: Every day | ORAL | Status: DC
  Filled 2023-05-04 (×2): qty 1

## 2023-05-04 MED ORDER — FUROSEMIDE 10 MG/ML IJ SOLN
40.0000 mg | Freq: Two times a day (BID) | INTRAMUSCULAR | Status: AC
  Administered 2023-05-05 (×2): 40 mg via INTRAVENOUS
  Filled 2023-05-04 (×2): qty 4

## 2023-05-04 MED ORDER — OCTREOTIDE ACETATE 500 MCG/ML IJ SOLN
INTRAMUSCULAR | Status: AC
  Filled 2023-05-04: qty 1

## 2023-05-04 MED ORDER — SODIUM CHLORIDE 0.9 % IV SOLN
2.0000 g | INTRAVENOUS | Status: DC
  Administered 2023-05-04 – 2023-05-05 (×2): 2 g via INTRAVENOUS
  Filled 2023-05-04 (×2): qty 20

## 2023-05-04 MED ORDER — FUROSEMIDE 10 MG/ML IJ SOLN
20.0000 mg | Freq: Once | INTRAMUSCULAR | Status: AC
  Administered 2023-05-04: 20 mg via INTRAVENOUS
  Filled 2023-05-04: qty 2

## 2023-05-04 MED ORDER — LACTULOSE 10 GM/15ML PO SOLN: 10.0000 g | ORAL | Status: DC | PRN

## 2023-05-04 MED ORDER — IOHEXOL 350 MG/ML SOLN
75.0000 mL | Freq: Once | INTRAVENOUS | Status: AC | PRN
  Administered 2023-05-04: 75 mL via INTRAVENOUS

## 2023-05-04 MED ORDER — PRAMIPEXOLE DIHYDROCHLORIDE 0.25 MG PO TABS
0.7500 mg | ORAL_TABLET | Freq: Every evening | ORAL | Status: DC
  Administered 2023-05-05: 0.75 mg via ORAL
  Filled 2023-05-04 (×3): qty 3

## 2023-05-04 MED ORDER — PANTOPRAZOLE 80MG IVPB - SIMPLE MED
80.0000 mg | Freq: Once | INTRAVENOUS | Status: AC
  Administered 2023-05-04: 80 mg via INTRAVENOUS
  Filled 2023-05-04: qty 100

## 2023-05-04 MED ORDER — SODIUM CHLORIDE 0.9 % IV SOLN
50.0000 ug/h | INTRAVENOUS | Status: DC
  Administered 2023-05-05 – 2023-05-06 (×4): 50 ug/h via INTRAVENOUS
  Filled 2023-05-04 (×7): qty 1

## 2023-05-04 NOTE — ED Notes (Signed)
Daughter has questions about admission. PA DuPont requested to bedside. Wardell Heath Gerrianne Scale

## 2023-05-04 NOTE — H&P (Addendum)
History and Physical    Patient: Cristina Santos ZOX:[id number] DOB: [birth year] DOA: 05/04/2023 DOS: the patient was seen and examined on 05/04/2023 PCP: Alinda Deem, MD  Patient coming from: Home  Chief Complaint:  Chief Complaint  Patient presents with   Shortness of Breath   HPI: Cristina Santos is a 82 y.o. 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  She complained of shortness of breath on exertion and with difficulty in being able to lay flat and also complained of bilateral leg swelling and increased abdominal girth that has been worsening over the last 2 to 3 months, she was initially placed on spironolactone by her gastroenterologist without much improvement and recently followed up with cardiologist (Dr. Wyline Mood) who started her on Lasix 20 mg daily.  Recent echo done showed normal LVEF at 60 to 65%, but with G2 DD.  She endorsed increased weight gain despite use of Lasix, but denies chest pain, nausea, vomiting.  ED Course:  In the emergency department, temperature was 97.5 F, respiratory rate 14/min, pulse 57 bpm, BP 144/59, O2 sat was 100% on room air.  Workup in the ED showed WBC 2.9, platelets 93 hemoglobin 8.9 hemoglobin on 03/01/2023 was 10.4), hematocrit 29.2, MCV 80.9.  BMP was normal except for sodium of 131 and glucose of 107.  Total bilirubin 1.4, troponin x 2 was flat at 4, ammonia less than 10, BNP 246, INR 3.0, urinalysis was normal. Chest x-ray showed no signs of pulmonary edema or focal pulmonary consolidation. CT of the chest with contrast showed no evidence of pulmonary artery embolism, no evidence of thoracic aortic dissection and no focal pulmonary consolidation.  Minimal  bilateral pleural effusions. There is slight increase in interstitial markings in the posterior lower lung fields suggesting possible scarring or subsegmental atelectasis. Small ascites. Splenomegaly. CT abdomen and pelvis with contrast showed no evidence of intestinal obstruction or pneumoperitoneum. There is minimal nodularity in liver suggesting possible cirrhosis. Enlarged spleen. Small ascites. Portal hypertension. Minimal bilateral pleural effusions. There is possible minimal filling defect in superior mesenteric vein which may suggest minimal residual chronic mesenteric vein thrombosis. Gastroenterology was consulted and recommended octreotide infusion and Protonix drip.  Hospitalist was asked to admit patient for further evaluation and management.  Review of Systems: Review of systems as noted in the HPI. All other systems reviewed and are negative.   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 10/09/2020   Pancreatic cysts 10/09/2020   Portal hypertension (HCC)-gastropathy and colopathy 10/09/2020   Sleep apnea    Thrombosis, hepatic vein (HCC)    SMV   Thyroid disease    Vestibular migraine    Past Surgical History:  Procedure Laterality Date   ABDOMINAL HYSTERECTOMY  1987   COLON RESECTION     COLONOSCOPY     PORTACATH PLACEMENT     Never removed placed at time of colon cancer treatment   SHOULDER ARTHROSCOPY  left   TOTAL KNEE ARTHROPLASTY Left 06/08/2019   Procedure: TOTAL KNEE ARTHROPLASTY;  Surgeon: Ollen Gross, MD;  Location: [facility];  Service: Orthopedics;  Laterality: Left;    UPPER GASTROINTESTINAL ENDOSCOPY      Social History:  reports that  she has never smoked. She has never used smokeless tobacco. She reports that she does not drink alcohol and does not use drugs.   Allergies  Allergen Reactions   Latex Anaphylaxis   Codeine Palpitations    Family History  Problem Relation Age of Onset   Prostate cancer Brother    Lung cancer Brother    Heart disease Brother    Bone cancer Brother    Diabetes Mother    Alzheimer's disease Mother    Diabetes Brother    Heart attack Brother    Lung cancer Brother    Bone cancer Brother    Lung cancer Sister        with metz    Ulcerative colitis Sister    Colon cancer Neg Hx    Esophageal cancer Neg Hx    Stomach cancer Neg Hx    Pancreatic cancer Neg Hx    AAA (abdominal aortic aneurysm) Neg Hx      Prior to Admission medications   Medication Sig Start Date End Date Taking? Authorizing Provider  albuterol (VENTOLIN HFA) 108 (90 Base) MCG/ACT inhaler Inhale 1 puff into the lungs as needed.   Yes [provider]  ascorbic acid (VITAMIN C) 500 MG tablet Take 500 mg by mouth daily.   Yes [provider]  Biotin 1000 MCG tablet Take 1,000 mcg by mouth daily.   Yes [provider]  Cholecalciferol 50 MCG (2000 UT) TABS Take 2,000 Units by mouth daily.   Yes [provider]  diphenhydrAMINE-APAP, sleep, (TYLENOL PM EXTRA STRENGTH PO) Take 1 tablet by mouth as needed.   Yes [provider]  furosemide (LASIX) 20 MG tablet Take 1 tablet (20 mg total) by mouth daily. 04/10/23  Yes Branch, Dorothe Pea, MD  lactulose (CHRONULAC) 10 GM/15ML solution Take 10 g by mouth as needed.   Yes [provider]  levothyroxine (SYNTHROID) 75 MCG tablet Take 75 mcg by mouth daily before breakfast.    Yes [provider]  magnesium 30 MG tablet Take 30 mg by mouth at bedtime.   Yes [provider]  Multiple Vitamins-Minerals (PRESERVISION AREDS PO) Take 1 tablet by mouth daily.   Yes [provider]  nadolol (CORGARD) 20 MG  tablet Take 1 tablet by mouth once daily Patient taking differently: Take 20 mg by mouth every evening. 04/26/23  Yes Iva Boop, MD  pantoprazole (PROTONIX) 40 MG tablet Take 40 mg by mouth daily.   Yes [provider]  pramipexole (MIRAPEX) 0.75 MG tablet Take 0.75 mg by mouth every evening.   Yes [provider]  triamcinolone cream (KENALOG) 0.1 % Apply topically. 04/09/23  Yes [provider]  vitamin B-12 (CYANOCOBALAMIN) 1000 MCG tablet Take 1,000 mcg by mouth daily.   Yes [provider]  XARELTO 20 MG TABS tablet Take 20 mg by mouth daily.   Yes [provider]  spironolactone (ALDACTONE) 25 MG tablet Take 1 tablet (25 mg total) by mouth daily. Patient not taking: Reported on 05/04/2023 03/01/23   Iva Boop, MD  topiramate (TOPAMAX) 50 MG tablet Take 50 mg by mouth as needed. Patient not taking: Reported on 05/04/2023 05/18/22   [provider]  Physical Exam: BP (!) 145/55 (BP Location: Right Arm)   Pulse (!) 55   Temp 98.2 F (36.8 C) (Oral)   Resp 20   Ht 5\' 5"  (1.651 m)   Wt 89.4 kg   SpO2 99%   BMI 32.80 kg/m   General: 82 y.o. year-old female well developed well nourished in no acute distress.  Alert and oriented x3. HEENT: NCAT, EOMI Neck: Supple, trachea medial Cardiovascular: Bradycardia. Regular rate and rhythm with no rubs or gallops.  No thyromegaly or JVD noted.  +2 lower extremity edema bilaterally. 2/4 pulses in all 4 extremities. Respiratory: Bilateral Rales in lower lobes on auscultation with no wheezes.  Abdomen: Soft, nontender nondistended with normal bowel sounds x4 quadrants. Muskuloskeletal: No cyanosis, clubbing or edema noted bilaterally Neuro: CN II-XII intact, strength 5/5 x 4, sensation, reflexes intact Skin: No ulcerative lesions noted or rashes Psychiatry: Judgement and insight appear normal. Mood is appropriate for condition and setting          Labs on Admission:  Basic  Metabolic Panel: Recent Labs  Lab 05/04/23 1336  NA 131*  K 3.7  CL 98  CO2 25  GLUCOSE 107*  BUN 14  CREATININE 0.84  CALCIUM 9.1  MG 2.0   Liver Function Tests: Recent Labs  Lab 05/04/23 1336  AST 20  ALT 19  ALKPHOS 74  BILITOT 1.4*  PROT 6.7  ALBUMIN 4.0   No results for input(s): "LIPASE", "AMYLASE" in the last 168 hours. Recent Labs  Lab 05/04/23 1347  AMMONIA <10   CBC: Recent Labs  Lab 05/04/23 1336  WBC 2.9*  NEUTROABS 2.0  HGB 8.9*  HCT 29.2*  MCV 80.9  PLT 93*   Cardiac Enzymes: No results for input(s): "CKTOTAL", "CKMB", "CKMBINDEX", "TROPONINI" in the last 168 hours.  BNP (last 3 results) Recent Labs    05/04/23 1336  BNP 246.0*    ProBNP (last 3 results) Recent Labs    03/01/23 1101  PROBNP 192.0*    CBG: No results for input(s): "GLUCAP" in the last 168 hours.  Radiological Exams on Admission: CT ABDOMEN PELVIS W CONTRAST  Result Date: 05/04/2023 CLINICAL DATA:  Abdominal pain and distention, cirrhosis of liver, shortness of breath EXAM: CT ABDOMEN AND PELVIS WITH CONTRAST TECHNIQUE: Multidetector CT imaging of the abdomen and pelvis was performed using the standard protocol following bolus administration of intravenous contrast. RADIATION DOSE REDUCTION: This exam was performed according to the departmental dose-optimization program which includes automated exposure control, adjustment of the mA and/or kV according to patient size and/or use of iterative reconstruction technique. CONTRAST:  75mL OMNIPAQUE IOHEXOL 350 MG/ML SOLN COMPARISON:  12/27/2022 FINDINGS: Lower chest: Minimal bilateral pleural effusions are seen. Hepatobiliary: There is minimal nodularity in liver surface. Gallbladder is not distended. There is no wall thickening in gallbladder. Small amount of fluid is noted adjacent to the gallbladder, possibly part small perihepatic ascites. Pancreas: No focal abnormalities are seen. Spleen: Spleen measures 18.1 cm in AP  diameter. There is trace amount of ascites adjacent to the spleen. Adrenals/Urinary Tract: Adrenals are unremarkable. There is no hydronephrosis in the left kidney. There is slight prominence of collecting system in the right kidney. There are no demonstrable renal or ureteral stones. Urinary bladder is unremarkable. Stomach/Bowel: Stomach is not distended. Small bowel loops are not dilated. Appendix is not seen. There is no pericecal inflammation. There is no significant wall thickening in colon. There is no pericolic stranding. Vascular/Lymphatic: Arterial calcifications are seen in aorta and  its major branches. In the previous study, portal venous thrombosis was seen in SMV. There is questionable minimal residual thrombus in the SMV. There are no filling defects in portal venous branches. There is ectasia of vessels along the greater curvature and the lesser curvature aspect of stomach suggesting possible portal hypertension. Reproductive: Uterus is not seen. Other: There is minimal ascites. There is no pneumoperitoneum. Umbilical hernia containing fat is seen. Right inguinal hernia containing fat is seen. Musculoskeletal: There is minimal anterolisthesis at the L4-L5 level. There is minimal retrolisthesis at L1-L2 and L2-L3 levels. Degenerative changes are noted with facet hypertrophy, more so in the lower lumbar spine. Spinal stenosis and encroachment of neural foramina is seen at multiple levels. IMPRESSION: There is no evidence of intestinal obstruction or pneumoperitoneum. There is slight prominence of the pelvocaliceal system in the right kidney which may be residual from previous obstruction. There are no demonstrable renal or ureteral stones. There is minimal nodularity in liver suggesting possible cirrhosis. Enlarged spleen. Small ascites. Portal hypertension. Minimal bilateral pleural effusions. There is possible minimal filling defect in superior mesenteric vein which may suggest minimal residual  chronic mesenteric vein thrombosis. Aortic arteriosclerosis.  Lumbar spondylosis. Other findings as described in the body of the report. Electronically Signed   By: Ernie Avena M.D.   On: 05/04/2023 18:28   CT Angio Chest PE W and/or Wo Contrast  Result Date: 05/04/2023 CLINICAL DATA:  Shortness of breath, clinical suspicion for PE EXAM: CT ANGIOGRAPHY CHEST WITH CONTRAST TECHNIQUE: Multidetector CT imaging of the chest was performed using the standard protocol during bolus administration of intravenous contrast. Multiplanar CT image reconstructions and MIPs were obtained to evaluate the vascular anatomy. RADIATION DOSE REDUCTION: This exam was performed according to the departmental dose-optimization program which includes automated exposure control, adjustment of the mA and/or kV according to patient size and/or use of iterative reconstruction technique. CONTRAST:  75mL OMNIPAQUE IOHEXOL 350 MG/ML SOLN COMPARISON:  Chest radiograph done today FINDINGS: Cardiovascular: There is homogeneous enhancement in thoracic aorta. There are no intraluminal filling defects in central pulmonary artery and branches. Evaluation of small peripheral branches is less than optimal. Scattered calcifications are seen in thoracic aorta. There is dense calcification in mitral annulus. Small scattered coronary artery calcifications are seen. Mediastinum/Nodes: No significant lymphadenopathy is seen. Lungs/Pleura: There is no focal pulmonary consolidation. Increased interstitial markings are seen in the posterior aspects of both lower lung fields. Minimal bilateral pleural effusions are seen. There is no pneumothorax. Upper Abdomen: Small ascites is present in perihepatic region. AP diameter of spleen measures 17.6 cm. Musculoskeletal: Degenerative changes are noted in cervical and thoracic spine. There is encroachment of neural foramina by bony spurs in lower cervical spine. Review of the MIP images confirms the above  findings. IMPRESSION: There is no evidence of pulmonary artery embolism. There is no evidence of thoracic aortic dissection. There is no focal pulmonary consolidation. Minimal bilateral pleural effusions. There is slight increase in interstitial markings in the posterior lower lung fields suggesting possible scarring or subsegmental atelectasis. Small ascites. Splenomegaly. Aortic arteriosclerosis.  Coronary artery calcifications are seen. Electronically Signed   By: Ernie Avena M.D.   On: 05/04/2023 18:11   DG Chest Port 1 View  Result Date: 05/04/2023 CLINICAL DATA:  Shortness of breath EXAM: PORTABLE CHEST 1 VIEW COMPARISON:  03/01/2023 FINDINGS: Transverse diameter of heart is increased. Tip of right subclavian chest port is seen in superior vena cava. There are no signs of pulmonary edema or focal pulmonary  consolidation. There is no pleural effusion or pneumothorax. Degenerative changes are noted in both shoulders. IMPRESSION: There are no signs of pulmonary edema or focal pulmonary consolidation. Electronically Signed   By: Ernie Avena M.D.   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)-trace ascites and 1+ varices   Portal hypertension (HCC)-gastropathy and colopathy    Esophageal varices without bleeding (HCC)   Acute blood loss anemia   Bilateral pleural effusion   Acute GI bleeding   Leukopenia   Thrombocytopenia (HCC)   GERD (gastroesophageal reflux disease)   Obstructive sleep  apnea   Acquired hypothyroidism   Prolonged QT interval   Restless leg syndrome   Paroxysmal atrial flutter (HCC)   Supratherapeutic INR  Acute on chronic HFpEF BNP 246 Patient complaining of dyspnea on exertion, orthopnea, bilateral leg swelling, increased abdominal girth, recent increased weight gain Continue total input/output, daily weights and fluid restriction Continue IV Lasix 40 twice daily Continue heart healthy diet  Echocardiogram done on 04/02/2023 showed LVEF of 60 to 65%.  No RWMA.  G2 DD.  Normal PASP.  Abnormal MC.  Trivial MVR.  Echocardiogram will be done in the morning   Bilateral pleural effusions CT abdomen and pelvis and CT angiography of chest showed minimal bilateral pleural effusions Continue IV Lasix 40 mg twice daily  Acute GI bleed Acute blood loss anemia Hemoglobin 8.9,  hemoglobin on 03/01/2023 was 10.4) Hemoccult was positive Continue IV Protonix drip per gastroenterologist recommendation Continue IV octreotide drip (due to upper GI bleed) per gastroenterologist recommendation Gastroenterologist was consulted by ED PA and will follow-up on patient in the morning  Continue clear liquid diet; she will be placed on n.p.o. at midnight  Prolonged QT interval QTc  500 ms Avoid QT prolonging drugs Magnesium level 2.0 Repeat EKG in the morning  Paroxysmal atrial flutter Continue home nadolol Xarelto will continue to be held at this time due to GI bleed  Supratherapeutic INR INR 3.0, this may be due to patient's liver cirrhosis Patient was not on warfarin  Leukopenia possibly reactive vs infectious, rule out spontaneous bacterial peritonitis WBC 2.9 Continue empiric IV ceftriaxone per gastroenterologist recommendation  Chronic thrombocytopenia Platelets 93, this is possibly due to patient's history of cirrhosis Continue to monitor platelets with morning labs  GERD Continue Protonix  Chronic superior mesenteric vein thrombosis Xarelto will be  held at this time due to GI bleed  Liver cirrhosis secondary to NASH Portal hypertension Esophageal varices Continue Protonix drip, octreotide infusion Continue nadolol Continue lactulose  Obstructive sleep apnea on CPAP Continue CPAP  Restless leg syndrome Continue Mirapex  Acquired hypothyroidism Continue Synthroid  Obesity (BMI 32.80) Diet and lifestyle modification    DVT prophylaxis: SCDs  Advance Care Planning: Full code  Consults: Gastroenterology (by ED PA)  Family Communication: Daughter at bedside (all questions answered to satisfaction)  Severity of Illness: The appropriate patient status for this patient is INPATIENT. Inpatient status is judged to be reasonable and necessary in order to provide the required intensity  of service to ensure the patient's safety. The patient's presenting symptoms, physical exam findings, and initial radiographic and laboratory data in the context of their chronic comorbidities is felt to place them at high risk for further clinical deterioration. Furthermore, it is not anticipated that the patient will be medically stable for discharge from the hospital within 2 midnights of admission.   * I certify that at the point of admission it is my clinical judgment that the patient will require inpatient hospital care spanning beyond 2 midnights from the point of admission due to high intensity of service, high risk for further deterioration and high frequency of surveillance required.*  Author: Frankey Shown, DO 05/04/2023 9:45 PM  For on call review [email or web address].

## 2023-05-04 NOTE — ED Notes (Signed)
Upon this RN's assessment, patient is alert and oriented. She denies pain but reports some weakness. Daughter has questions about results. PA [facility] informed. Cristina Santos

## 2023-05-04 NOTE — Telephone Encounter (Signed)
Patient's daughter called regarding pt having worsening shortness of breath. Was recently seen in the office with Dr. Wyline Mood for the same. Recent echo with normal LVEF, it was suspected that symptoms could be related to diastolic HF. Daughter reports her shortness of breath has worsened over the past week despite being on lasix.Unable to even walk to the bathroom at this point. Denies any chest pain or anginal symptoms per say. Given the progression of her symptoms, advised it was reasonable to be evaluated in the ED as things have not improved with recent medication changes. Daughter thanked me for callback

## 2023-05-04 NOTE — ED Triage Notes (Signed)
Pt via POV c/o SOB x 1 week worse over the past few days. Pt recently had an echo and has been taking lasix 20mg  daily for baseline SOB; this is worse and feels different than what she has been used to.

## 2023-05-04 NOTE — ED Provider Notes (Incomplete)
[place] EMERGENCY DEPARTMENT AT [facility] Provider Note   CSN: [id number] Arrival date & time: 05/04/23  1317     History {Add pertinent medical, surgical, social history, OB history to HPI:1} Chief Complaint  Patient presents with   Shortness of Breath    Cristina Santos is a 82 y.o. female.   Shortness of Breath      Home Medications Prior to Admission medications   Medication Sig Start Date End Date Taking? Authorizing Provider  albuterol (VENTOLIN HFA) 108 (90 Base) MCG/ACT inhaler Inhale 1 puff into the lungs as needed.   Yes [provider]  ascorbic acid (VITAMIN C) 500 MG tablet Take 500 mg by mouth daily.   Yes [provider]  Biotin 1000 MCG tablet Take 1,000 mcg by mouth daily.   Yes [provider]  Cholecalciferol 50 MCG (2000 UT) TABS Take 2,000 Units by mouth daily.   Yes [provider]  diphenhydrAMINE-APAP, sleep, (TYLENOL PM EXTRA STRENGTH PO) Take 1 tablet by mouth as needed.   Yes [provider]  furosemide (LASIX) 20 MG tablet Take 1 tablet (20 mg total) by mouth daily. 04/10/23  Yes Branch, Dorothe Pea, MD  lactulose (CHRONULAC) 10 GM/15ML solution Take 10 g by mouth as needed.   Yes [provider]  levothyroxine (SYNTHROID) 75 MCG tablet Take 75 mcg by mouth daily before breakfast.    Yes [provider]  magnesium 30 MG tablet Take 30 mg by mouth at bedtime.   Yes [provider]  Multiple Vitamins-Minerals (PRESERVISION AREDS PO) Take 1 tablet by mouth daily.   Yes [provider]  nadolol (CORGARD) 20 MG tablet Take 1 tablet by mouth once daily Patient taking differently: Take 20 mg by mouth every evening. 04/26/23  Yes Iva Boop, MD  pantoprazole (PROTONIX) 40 MG tablet Take 40 mg by mouth daily.   Yes [provider]  pramipexole (MIRAPEX) 0.75 MG tablet Take 0.75 mg by mouth every evening.   Yes [provider]  triamcinolone cream  (KENALOG) 0.1 % Apply topically. 04/09/23  Yes [provider]  vitamin B-12 (CYANOCOBALAMIN) 1000 MCG tablet Take 1,000 mcg by mouth daily.   Yes [provider]  XARELTO 20 MG TABS tablet Take 20 mg by mouth daily.   Yes [provider]  spironolactone (ALDACTONE) 25 MG tablet Take 1 tablet (25 mg total) by mouth daily. Patient not taking: Reported on 05/04/2023 03/01/23   Iva Boop, MD  topiramate (TOPAMAX) 50 MG tablet Take 50 mg by mouth as needed. Patient not taking: Reported on 05/04/2023 05/18/22   [provider]      Allergies    Latex and Codeine    Review of Systems   Review of Systems  Respiratory:  Positive for shortness of breath.     Physical Exam Updated Vital Signs BP (!) 145/49   Pulse 62   Temp (!) 97.5 F (36.4 C) (Oral)   Resp 16   Ht 5\' 5"  (1.651 m)   Wt 90.7 kg   SpO2 99%   BMI 33.28 kg/m  Physical Exam  ED Results / Procedures / Treatments   Labs (all labs ordered are listed, but only abnormal results are displayed) Labs Reviewed  CBC WITH DIFFERENTIAL/PLATELET - Abnormal; Notable for the following components:      Result Value   WBC 2.9 (*)    RBC 3.61 (*)    Hemoglobin 8.9 (*)  HCT 29.2 (*)    MCH 24.7 (*)    Platelets 93 (*)    Lymphs Abs 0.6 (*)    All other components within normal limits  COMPREHENSIVE METABOLIC PANEL - Abnormal; Notable for the following components:   Sodium 131 (*)    Glucose, Bld 107 (*)    Total Bilirubin 1.4 (*)    All other components within normal limits  BRAIN NATRIURETIC PEPTIDE - Abnormal; Notable for the following components:   B Natriuretic Peptide 246.0 (*)    All other components within normal limits  URINALYSIS, ROUTINE W REFLEX MICROSCOPIC - Abnormal; Notable for the following components:   Color, Urine COLORLESS (*)    Specific Gravity, Urine 1.003 (*)    All other components within normal limits  PROTIME-INR - Abnormal; Notable for the following components:    Prothrombin Time 31.6 (*)    INR 3.0 (*)    All other components within normal limits  POC OCCULT BLOOD, ED - Abnormal  AMMONIA  MAGNESIUM  TROPONIN I (HIGH SENSITIVITY)  TROPONIN I (HIGH SENSITIVITY)    EKG EKG Interpretation  Date/Time:  Saturday May 04 2023 13:34:36 EDT Ventricular Rate:  72 PR Interval:  211 QRS Duration: 77 QT Interval:  446 QTC Calculation: 489 R Axis:   84 Text Interpretation: Sinus rhythm Atrial premature complex Sinus pause Borderline right axis deviation Borderline prolonged QT interval Baseline wander in lead(s) V1 Confirmed by Jacalyn Lefevre [phone]) on 05/04/2023 1:45:07 PM  Radiology DG Chest Port 1 View  Result Date: 05/04/2023 CLINICAL DATA:  Shortness of breath EXAM: PORTABLE CHEST 1 VIEW COMPARISON:  03/01/2023 FINDINGS: Transverse diameter of heart is increased. Tip of right subclavian chest port is seen in superior vena cava. There are no signs of pulmonary edema or focal pulmonary consolidation. There is no pleural effusion or pneumothorax. Degenerative changes are noted in both shoulders. IMPRESSION: There are no signs of pulmonary edema or focal pulmonary consolidation. Electronically Signed   By: Ernie Avena M.D.   On: 05/04/2023 14:49    Procedures Procedures  {Document cardiac monitor, telemetry assessment procedure when appropriate:1}  Medications Ordered in ED Medications  albuterol (PROVENTIL) (2.5 MG/3ML) 0.083% nebulizer solution 2.5 mg (has no administration in time range)  iohexol (OMNIPAQUE) 350 MG/ML injection 75 mL (has no administration in time range)    ED Course/ Medical Decision Making/ A&P   {   Click here for ABCD2, HEART and other calculatorsREFRESH Note before signing :1}                          Medical Decision Making Amount and/or Complexity of Data Reviewed Labs: ordered. Radiology: ordered.  Risk Prescription drug management.   ***  {Document critical care time when  appropriate:1} {Document review of labs and clinical decision tools ie heart score, Chads2Vasc2 etc:1}  {Document your independent review of radiology images, and any outside records:1} {Document your discussion with family members, caretakers, and with consultants:1} {Document social determinants of health affecting pt's care:1} {Document your decision making why or why not admission, treatments were needed:1} Final Clinical Impression(s) / ED Diagnoses Final diagnoses:  None    Rx / DC Orders ED Discharge Orders     None

## 2023-05-05 ENCOUNTER — Inpatient Hospital Stay (HOSPITAL_COMMUNITY): Payer: MEDICARE

## 2023-05-05 ENCOUNTER — Other Ambulatory Visit (HOSPITAL_COMMUNITY): Payer: Self-pay | Admitting: *Deleted

## 2023-05-05 DIAGNOSIS — K7581 Nonalcoholic steatohepatitis (NASH): Secondary | ICD-10-CM | POA: Diagnosis not present

## 2023-05-05 DIAGNOSIS — I4892 Unspecified atrial flutter: Secondary | ICD-10-CM

## 2023-05-05 DIAGNOSIS — D696 Thrombocytopenia, unspecified: Secondary | ICD-10-CM | POA: Diagnosis not present

## 2023-05-05 DIAGNOSIS — K55069 Acute infarction of intestine, part and extent unspecified: Secondary | ICD-10-CM

## 2023-05-05 DIAGNOSIS — I5033 Acute on chronic diastolic (congestive) heart failure: Secondary | ICD-10-CM

## 2023-05-05 DIAGNOSIS — D62 Acute posthemorrhagic anemia: Secondary | ICD-10-CM | POA: Diagnosis not present

## 2023-05-05 DIAGNOSIS — K922 Gastrointestinal hemorrhage, unspecified: Secondary | ICD-10-CM | POA: Diagnosis not present

## 2023-05-05 DIAGNOSIS — I5031 Acute diastolic (congestive) heart failure: Secondary | ICD-10-CM | POA: Diagnosis not present

## 2023-05-05 DIAGNOSIS — R195 Other fecal abnormalities: Secondary | ICD-10-CM

## 2023-05-05 DIAGNOSIS — K7460 Unspecified cirrhosis of liver: Secondary | ICD-10-CM

## 2023-05-05 DIAGNOSIS — K7290 Hepatic failure, unspecified without coma: Secondary | ICD-10-CM

## 2023-05-05 LAB — COMPREHENSIVE METABOLIC PANEL
ALT: 15 U/L (ref 0–44)
AST: 17 U/L (ref 15–41)
Albumin: 3.4 g/dL — ABNORMAL LOW (ref 3.5–5.0)
Alkaline Phosphatase: 57 U/L (ref 38–126)
Anion gap: 11 (ref 5–15)
BUN: 13 mg/dL (ref 8–23)
CO2: 24 mmol/L (ref 22–32)
Calcium: 8.5 mg/dL — ABNORMAL LOW (ref 8.9–10.3)
Chloride: 99 mmol/L (ref 98–111)
Creatinine, Ser: 0.86 mg/dL (ref 0.44–1.00)
GFR, Estimated: >60 mL/min (ref >60)
Glucose, Bld: 133 mg/dL — ABNORMAL HIGH (ref 70–99)
Potassium: 3.8 mmol/L (ref 3.5–5.1)
Sodium: 134 mmol/L — ABNORMAL LOW (ref 135–145)
Total Bilirubin: 1.3 mg/dL — ABNORMAL HIGH (ref 0.3–1.2)
Total Protein: 5.8 g/dL — ABNORMAL LOW (ref 6.5–8.1)

## 2023-05-05 LAB — ECHOCARDIOGRAM LIMITED
Height: 65 in
S' Lateral: 2.3 cm
Weight: 3121.71 oz

## 2023-05-05 LAB — CBC
HCT: 26.6 % — ABNORMAL LOW (ref 36.0–46.0)
Hemoglobin: 8.2 g/dL — ABNORMAL LOW (ref 12.0–15.0)
MCH: 25.1 pg — ABNORMAL LOW (ref 26.0–34.0)
MCHC: 30.8 g/dL (ref 30.0–36.0)
MCV: 81.3 fL (ref 80.0–100.0)
Platelets: 83 10*3/uL — ABNORMAL LOW (ref 150–400)
RBC: 3.27 MIL/uL — ABNORMAL LOW (ref 3.87–5.11)
RDW: 15.2 % (ref 11.5–15.5)
WBC: 2.4 10*3/uL — ABNORMAL LOW (ref 4.0–10.5)
nRBC: 0 % (ref 0.0–0.2)

## 2023-05-05 LAB — PROTIME-INR
INR: 1.6 — ABNORMAL HIGH (ref 0.8–1.2)
Prothrombin Time: 19 seconds — ABNORMAL HIGH (ref 11.4–15.2)

## 2023-05-05 LAB — PHOSPHORUS: Phosphorus: 4.3 mg/dL (ref 2.5–4.6)

## 2023-05-05 LAB — MAGNESIUM: Magnesium: 1.8 mg/dL (ref 1.7–2.4)

## 2023-05-05 MED ORDER — DIPHENHYDRAMINE-ZINC ACETATE 2-0.1 % EX CREA: TOPICAL_CREAM | Freq: Three times a day (TID) | CUTANEOUS | Status: DC | PRN

## 2023-05-05 MED ORDER — CHLORHEXIDINE GLUCONATE CLOTH 2 % EX PADS
6.0000 | MEDICATED_PAD | Freq: Every day | CUTANEOUS | Status: DC
  Administered 2023-05-05 – 2023-05-06 (×2): 6 via TOPICAL

## 2023-05-05 MED ORDER — PEG 3350-KCL-NA BICARB-NACL 420 G PO SOLR
4000.0000 mL | Freq: Once | ORAL | Status: AC
  Administered 2023-05-05: 4000 mL via ORAL

## 2023-05-05 MED ORDER — FUROSEMIDE 40 MG PO TABS
40.0000 mg | ORAL_TABLET | Freq: Every day | ORAL | Status: DC
  Administered 2023-05-06: 40 mg via ORAL
  Filled 2023-05-05: qty 1

## 2023-05-05 MED ORDER — VITAMIN K1 10 MG/ML IJ SOLN
10.0000 mg | Freq: Once | INTRAVENOUS | Status: AC
  Administered 2023-05-05: 10 mg via INTRAVENOUS
  Filled 2023-05-05: qty 1

## 2023-05-05 MED ORDER — MAGNESIUM SULFATE 2 GM/50ML IV SOLN
2.0000 g | Freq: Once | INTRAVENOUS | Status: AC
  Administered 2023-05-05: 2 g via INTRAVENOUS
  Filled 2023-05-05: qty 50

## 2023-05-05 MED ORDER — NADOLOL 20 MG PO TABS
20.0000 mg | ORAL_TABLET | Freq: Every day | ORAL | Status: DC
  Administered 2023-05-05: 20 mg via ORAL
  Filled 2023-05-05 (×3): qty 1

## 2023-05-05 NOTE — H&P (View-Only) (Signed)
Consulting  Provider: Dr. Arbutus Leas Primary Care Physician:  Alinda Deem, MD Primary Gastroenterologist:  Dr. Leone Payor Corinda [facility])  Reason for Consultation: GI bleed, acute blood loss anemia  HPI:  Cristina Santos is a 82 y.o. 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  Also complaining of worsening bilateral lower extremity edema.  Currently taking Lasix 20 mg daily.  Was placed on spironolactone previously though this was changed by her cardiologist.  In the ER found to be hemodynamically stable.  Hemoglobin 8.9 down from baseline around 10-11.  INR 3.0.  CT abdomen pelvis with contrast which I personally reviewed showed cirrhotic findings, small ascites, splenomegaly, bilateral pleural effusions.  In regards to her SMV thrombosis this actually appeared improved on yesterday's CT imaging.  States she has been on Xarelto for approximately 2 months now.  Patient denies any gross melena or hematochezia.  Occasional abdominal discomfort.  No chronic NSAID use.  No history of peptic ulcer disease.  History of colon cancer in the 1980s status post resection.  EGD 09/02/2020 with grade 1 esophageal varices, gastritis (biopsies consistent with PHG), multiple fundic gland polyps, normal duodenum.  Chronically takes nadolol for prevention.  No history of variceal bleed prior.  Colonoscopy 09/02/2020 with external hemorrhoids, 5 polyps (mix of hyperplastic and tubular adenomas).  Multiple medium sized angioectasias in the ascending, transverse, descending, sigmoid colons.  Rectal varices noted.  Internal hemorrhoids noted.  Since admitted, started on IV Protonix, IV octreotide.  Has been receiving IV Lasix.  Hemoglobin down to 8.2 today.  INR improved to 1.6.  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 and 1+ varices 10/09/2020   Pancreatic cysts 10/09/2020   Portal hypertension (HCC)-gastropathy and colopathy 10/09/2020   Sleep apnea    Thrombosis, hepatic vein (HCC)    SMV   Thyroid disease    Vestibular migraine     Past Surgical History:  Procedure Laterality Date   ABDOMINAL HYSTERECTOMY  1987   COLON RESECTION     COLONOSCOPY     PORTACATH PLACEMENT     Never removed placed at time of colon cancer treatment   SHOULDER ARTHROSCOPY     left   TOTAL KNEE ARTHROPLASTY Left 06/08/2019   Procedure: TOTAL KNEE ARTHROPLASTY;  Surgeon: Ollen Gross, MD;  Location: [facility];  Service: Orthopedics;  Laterality: Left;    UPPER GASTROINTESTINAL ENDOSCOPY      Prior to Admission medications   Medication Sig Start Date End Date Taking? Authorizing Provider  albuterol (VENTOLIN HFA) 108 (90 Base) MCG/ACT inhaler Inhale 1 puff into the lungs as needed.   Yes [provider]  ascorbic acid (VITAMIN C) 500 MG tablet Take 500 mg by mouth daily.   Yes [provider]  Biotin 1000 MCG tablet Take 1,000 mcg by mouth daily.   Yes [provider]  Cholecalciferol 50 MCG (2000 UT) TABS Take 2,000 Units by mouth daily.   Yes [provider]  diphenhydrAMINE-APAP, sleep, (TYLENOL PM EXTRA STRENGTH PO) Take 1  tablet by mouth as needed.   Yes [provider]  furosemide (LASIX) 20 MG tablet Take 1 tablet (20 mg total) by mouth daily. 04/10/23  Yes Branch, Dorothe Pea, MD  lactulose (CHRONULAC) 10 GM/15ML solution Take 10 g by mouth as needed.   Yes [provider]  levothyroxine (SYNTHROID) 75 MCG tablet Take 75 mcg by mouth daily  before breakfast.    Yes [provider]  magnesium 30 MG tablet Take 30 mg by mouth at bedtime.   Yes [provider]  Multiple Vitamins-Minerals (PRESERVISION AREDS PO) Take 1 tablet by mouth daily.   Yes [provider]  nadolol (CORGARD) 20 MG tablet Take 1 tablet by mouth once daily Patient taking differently: Take 20 mg by mouth every evening. 04/26/23  Yes Iva Boop, MD  pantoprazole (PROTONIX) 40 MG tablet Take 40 mg by mouth daily.   Yes [provider]  pramipexole (MIRAPEX) 0.75 MG tablet Take 0.75 mg by mouth every evening.   Yes [provider]  triamcinolone cream (KENALOG) 0.1 % Apply topically. 04/09/23  Yes [provider]  vitamin B-12 (CYANOCOBALAMIN) 1000 MCG tablet Take 1,000 mcg by mouth daily.   Yes [provider]  XARELTO 20 MG TABS tablet Take 20 mg by mouth daily.   Yes [provider]  spironolactone (ALDACTONE) 25 MG tablet Take 1 tablet (25 mg total) by mouth daily. Patient not taking: Reported on 05/04/2023 03/01/23   Iva Boop, MD  topiramate (TOPAMAX) 50 MG tablet Take 50 mg by mouth as needed. Patient not taking: Reported on 05/04/2023 05/18/22   [provider]    Current Facility-Administered Medications  Medication Dose Route Frequency Provider Last Rate Last Admin   albuterol (PROVENTIL) (2.5 MG/3ML) 0.083% nebulizer solution 2.5 mg  2.5 mg Inhalation Q2H PRN Adefeso, Oladapo, DO       cefTRIAXone (ROCEPHIN) 2 g in sodium chloride 0.9 % 100 mL IVPB  2 g Intravenous Q24H Adefeso, Oladapo, DO 200 mL/hr at 05/04/23 2239 2 g at 05/04/23 2239   Chlorhexidine Gluconate Cloth 2 % PADS 6 each  6 each Topical Daily Adefeso, Oladapo, DO   6 each at 05/05/23 0848   diphenhydrAMINE-zinc acetate (BENADRYL) 2-0.1 % cream   Topical TID PRN Adefeso, Oladapo, DO       furosemide (LASIX) injection 40 mg  40 mg Intravenous Q12H Adefeso, Oladapo, DO   40 mg at 05/05/23 0927   lactulose  (CHRONULAC) 10 GM/15ML solution 10 g  10 g Oral PRN Adefeso, Oladapo, DO       levothyroxine (SYNTHROID) tablet 75 mcg  75 mcg Oral QAC breakfast Adefeso, Oladapo, DO       nadolol (CORGARD) tablet 20 mg  20 mg Oral QHS Madueme, Elvira C, RPH       octreotide (SANDOSTATIN) 500 mcg in sodium chloride 0.9 % 250 mL (2 mcg/mL) infusion  50 mcg/hr Intravenous Continuous Adefeso, Oladapo, DO 25 mL/hr at 05/05/23 0934 50 mcg/hr at 05/05/23 0934   [START ON 05/08/2023] pantoprazole (PROTONIX) injection 40 mg  40 mg Intravenous Q12H Adefeso, Oladapo, DO       pantoprozole (PROTONIX) 80 mg /NS 100 mL infusion  8 mg/hr Intravenous Continuous Adefeso, Oladapo, DO 10 mL/hr at 05/05/23 0830 8 mg/hr at 05/05/23 0830   phytonadione (VITAMIN K) 10 mg in dextrose 5 % 50 mL IVPB  10 mg Intravenous Once Lanelle Bal, DO 51 mL/hr at 05/05/23 1124 10 mg at 05/05/23 1124  pramipexole (MIRAPEX) tablet 0.75 mg  0.75 mg Oral QPM Adefeso, Oladapo, DO       prochlorperazine (COMPAZINE) injection 10 mg  10 mg Intravenous Q6H PRN Adefeso, Oladapo, DO        Allergies as of 05/04/2023 - Review Complete 05/04/2023  Allergen Reaction Noted   Latex Anaphylaxis 12/24/2012   Codeine Palpitations 12/24/2012    Family History  Problem Relation Age of Onset   Prostate cancer Brother    Lung cancer Brother    Heart disease Brother    Bone cancer Brother    Diabetes Mother    Alzheimer's disease Mother    Diabetes Brother    Heart attack Brother    Lung cancer Brother    Bone cancer Brother    Lung cancer Sister        with Estate manager/land agent    Ulcerative colitis Sister    Colon cancer Neg Hx    Esophageal cancer Neg Hx    Stomach cancer Neg Hx    Pancreatic cancer Neg Hx    AAA (abdominal aortic aneurysm) Neg Hx     Social History   Socioeconomic History   Marital status: Widowed    Spouse name: Not on file   Number of children: 2   Years of education: Not on file   Highest education level: Not on file  Occupational  History   Occupation: retired  Tobacco Use   Smoking status: Never   Smokeless tobacco: Never  Vaping Use   Vaping Use: Never used  Substance and Sexual Activity   Alcohol use: No   Drug use: No   Sexual activity: Not Currently  Other Topics Concern   Not on file  Social History Narrative   Married, 1 son one daughter. Daughter is a Publishing rights manager.   She is retired   2 cups coffee per day   Social Determinants of Corporate investment banker Strain: Not on file  Food Insecurity: Not on file  Transportation Needs: Not on file  Physical Activity: Not on file  Stress: Not on file  Social Connections: Not on file  Intimate Partner Violence: Not on file    Review of Systems: General: Negative for anorexia, weight loss, fever, chills, fatigue, weakness. Eyes: Negative for vision changes.  ENT: Negative for hoarseness, difficulty swallowing , nasal congestion. CV: Negative for chest pain, angina, palpitations, dyspnea on exertion, peripheral edema.  Respiratory: Positive for dyspnea, negative for cough, sputum, wheezing.  GI: See history of present illness. GU:  Negative for dysuria, hematuria, urinary incontinence, urinary frequency, nocturnal urination.  MS: Negative for joint pain, low back pain.  Derm: Negative for rash or itching.  Neuro: Negative for weakness, abnormal sensation, seizure, frequent headaches, memory loss, confusion.  Psych: Negative for anxiety, depression Endo: Negative for unusual weight change.  Heme: Negative for bruising or bleeding. Allergy: Negative for rash or hives.  Physical Exam: Vital signs in last 24 hours: Temp:  [97.5 F (36.4 C)-98.5 F (36.9 C)] 98.3 F (36.8 C) (06/16 0909) Pulse Rate:  [49-115] 52 (06/16 0909) Resp:  [11-26] 19 (06/16 0909) BP: (115-169)/(45-90) 120/62 (06/16 0909) SpO2:  [95 %-100 %] 100 % (06/16 0909) FiO2 (%):  [21 %] 21 % (06/15 2328) Weight:  [88.5 kg-90.7 kg] 88.5 kg (06/16 0500) Last BM Date :  05/04/23 General:   Alert,  Well-developed, well-nourished, pleasant and cooperative in NAD Head:  Normocephalic and atraumatic. Eyes:  Sclera clear, no icterus.   Conjunctiva pink. Ears:  Normal auditory acuity. Nose:  No deformity, discharge,  or lesions. Mouth:  No deformity or lesions, dentition normal. Neck:  Supple; no masses or thyromegaly. Lungs: Crackles bilateral lower lobes.  No wheezing.  No acute distress. Heart:  Regular rate and rhythm; no murmurs, clicks, rubs,  or gallops. Abdomen:  Soft, nontender and nondistended. No masses, hepatosplenomegaly or hernias noted. Normal bowel sounds, without guarding, and without rebound.   Msk:  Symmetrical without gross deformities. Normal posture. Pulses:  Normal pulses noted. Extremities:  Without clubbing or edema. Neurologic:  Alert and  oriented x4;  grossly normal neurologically. Skin:  Intact without significant lesions or rashes. Cervical Nodes:  No significant cervical adenopathy. Psych:  Alert and cooperative. Normal mood and affect.  Intake/Output from previous day: 06/15 0701 - 06/16 0700 In: -  Out: 1050 [Urine:1050] Intake/Output this shift: Total I/O In: 0  Out: 600 [Urine:600]  Lab Results: Recent Labs    05/04/23 1336 05/05/23 0438  WBC 2.9* 2.4*  HGB 8.9* 8.2*  HCT 29.2* 26.6*  PLT 93* 83*   BMET Recent Labs    05/04/23 1336 05/05/23 0438  NA 131* 134*  K 3.7 3.8  CL 98 99  CO2 25 24  GLUCOSE 107* 133*  BUN 14 13  CREATININE 0.84 0.86  CALCIUM 9.1 8.5*   LFT Recent Labs    05/04/23 1336 05/05/23 0438  PROT 6.7 5.8*  ALBUMIN 4.0 3.4*  AST 20 17  ALT 19 15  ALKPHOS 74 57  BILITOT 1.4* 1.3*   PT/INR Recent Labs    05/04/23 1336 05/05/23 1046  LABPROT 31.6* 19.0*  INR 3.0* 1.6*   Hepatitis Panel No results for input(s): "HEPBSAG", "HCVAB", "HEPAIGM", "HEPBIGM" in the last 72 hours. C-Diff No results for input(s): "CDIFFTOX" in the last 72 hours.  Studies/Results: CT ABDOMEN  PELVIS W CONTRAST  Result Date: 05/04/2023 CLINICAL DATA:  Abdominal pain and distention, cirrhosis of liver, shortness of breath EXAM: CT ABDOMEN AND PELVIS WITH CONTRAST TECHNIQUE: Multidetector CT imaging of the abdomen and pelvis was performed using the standard protocol following bolus administration of intravenous contrast. RADIATION DOSE REDUCTION: This exam was performed according to the departmental dose-optimization program which includes automated exposure control, adjustment of the mA and/or kV according to patient size and/or use of iterative reconstruction technique. CONTRAST:  75mL OMNIPAQUE IOHEXOL 350 MG/ML SOLN COMPARISON:  12/27/2022 FINDINGS: Lower chest: Minimal bilateral pleural effusions are seen. Hepatobiliary: There is minimal nodularity in liver surface. Gallbladder is not distended. There is no wall thickening in gallbladder. Small amount of fluid is noted adjacent to the gallbladder, possibly part small perihepatic ascites. Pancreas: No focal abnormalities are seen. Spleen: Spleen measures 18.1 cm in AP diameter. There is trace amount of ascites adjacent to the spleen. Adrenals/Urinary Tract: Adrenals are unremarkable. There is no hydronephrosis in the left kidney. There is slight prominence of collecting system in the right kidney. There are no demonstrable renal or ureteral stones. Urinary bladder is unremarkable. Stomach/Bowel: Stomach is not distended. Small bowel loops are not dilated. Appendix is not seen. There is no pericecal inflammation. There is no significant wall thickening in colon. There is no pericolic stranding. Vascular/Lymphatic: Arterial calcifications are seen in aorta and its major branches. In the previous study, portal venous thrombosis was seen in SMV. There is questionable minimal residual thrombus in the SMV. There are no filling defects in portal venous branches. There is ectasia of vessels along the greater curvature and the lesser curvature aspect of  stomach suggesting possible portal hypertension. Reproductive: Uterus is not seen. Other: There is minimal ascites. There is no pneumoperitoneum. Umbilical hernia containing fat is seen. Right inguinal hernia containing fat is seen. Musculoskeletal: There is minimal anterolisthesis at the L4-L5 level. There is minimal retrolisthesis at L1-L2 and L2-L3 levels. Degenerative changes are noted with facet hypertrophy, more so in the lower lumbar spine. Spinal stenosis and encroachment of neural foramina is seen at multiple levels. IMPRESSION: There is no evidence of intestinal obstruction or pneumoperitoneum. There is slight prominence of the pelvocaliceal system in the right kidney which may be residual from previous obstruction. There are no demonstrable renal or ureteral stones. There is minimal nodularity in liver suggesting possible cirrhosis. Enlarged spleen. Small ascites. Portal hypertension. Minimal bilateral pleural effusions. There is possible minimal filling defect in superior mesenteric vein which may suggest minimal residual chronic mesenteric vein thrombosis. Aortic arteriosclerosis.  Lumbar spondylosis. Other findings as described in the body of the report. Electronically Signed   By: Ernie Avena M.D.   On: 05/04/2023 18:28   CT Angio Chest PE W and/or Wo Contrast  Result Date: 05/04/2023 CLINICAL DATA:  Shortness of breath, clinical suspicion for PE EXAM: CT ANGIOGRAPHY CHEST WITH CONTRAST TECHNIQUE: Multidetector CT imaging of the chest was performed using the standard protocol during bolus administration of intravenous contrast. Multiplanar CT image reconstructions and MIPs were obtained to evaluate the vascular anatomy. RADIATION DOSE REDUCTION: This exam was performed according to the departmental dose-optimization program which includes automated exposure control, adjustment of the mA and/or kV according to patient size and/or use of iterative reconstruction technique. CONTRAST:  75mL  OMNIPAQUE IOHEXOL 350 MG/ML SOLN COMPARISON:  Chest radiograph done today FINDINGS: Cardiovascular: There is homogeneous enhancement in thoracic aorta. There are no intraluminal filling defects in central pulmonary artery and branches. Evaluation of small peripheral branches is less than optimal. Scattered calcifications are seen in thoracic aorta. There is dense calcification in mitral annulus. Small scattered coronary artery calcifications are seen. Mediastinum/Nodes: No significant lymphadenopathy is seen. Lungs/Pleura: There is no focal pulmonary consolidation. Increased interstitial markings are seen in the posterior aspects of both lower lung fields. Minimal bilateral pleural effusions are seen. There is no pneumothorax. Upper Abdomen: Small ascites is present in perihepatic region. AP diameter of spleen measures 17.6 cm. Musculoskeletal: Degenerative changes are noted in cervical and thoracic spine. There is encroachment of neural foramina by bony spurs in lower cervical spine. Review of the MIP images confirms the above findings. IMPRESSION: There is no evidence of pulmonary artery embolism. There is no evidence of thoracic aortic dissection. There is no focal pulmonary consolidation. Minimal bilateral pleural effusions. There is slight increase in interstitial markings in the posterior lower lung fields suggesting possible scarring or subsegmental atelectasis. Small ascites. Splenomegaly. Aortic arteriosclerosis.  Coronary artery calcifications are seen. Electronically Signed   By: Ernie Avena M.D.   On: 05/04/2023 18:11   DG Chest Port 1 View  Result Date: 05/04/2023 CLINICAL DATA:  Shortness of breath EXAM: PORTABLE CHEST 1 VIEW COMPARISON:  03/01/2023 FINDINGS: Transverse diameter of heart is increased. Tip of right subclavian chest port is seen in superior vena cava. There are no signs of pulmonary edema or focal pulmonary consolidation. There is no pleural effusion or pneumothorax.  Degenerative changes are noted in both shoulders. IMPRESSION: There are no signs of pulmonary edema or focal pulmonary consolidation. Electronically Signed   By: Ernie Avena M.D.   On: 05/04/2023 14:49    Assessment/Plan:  1.  Suspected GI bleeding-source unclear.  No gross melena or hematochezia.  Heme positive stool with worsening anemia.  History of grade 1 esophageal varices.  Also with multiple colonic AVMs on previous colonoscopy.  Continue on IV Protonix.  Continue on octreotide until variceal hemorrhage can be safely ruled out though this seems less likely given her clinical presentation.  Will tentatively plan on upper endoscopy and colonoscopy to further evaluate tomorrow.  Prep colon tonight in anticipation.  Clear liquid diet today.  N.p.o. after midnight.  2.  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  Continue daily LFTs and coags.  Follow-up with primary GI as outpatient.  3..  Acute blood loss anemia due to above-continue monitor H&H and transfuse for less than 7.  4.  Coagulopathy-likely multifactorial in the setting of chronic Xarelto use, cirrhosis.  Repeat INR today much improved at 1.6 down from 3.  Continue to monitor.  5.  Hypervolemia-also likely multifactorial in the setting of diastolic heart failure as well as cirrhosis.  Continue diuresis.  Monitor creatinine and electrolytes.  6.  SMV thrombus-CT imaging shows improvement in thrombus.  Hold Xarelto for now given suspected GI bleeding.  Further recommendations to follow in this regard after endoscopic evaluation.  Thank you for the consultation   Hennie Duos. Marletta Lor, D.O. Gastroenterology and Hepatology [facility]    LOS: 1 day     05/05/2023, 11:33 AM

## 2023-05-05 NOTE — TOC Initial Note (Signed)
Transition of Care [facility]) - Initial/Assessment Note    Patient Details  Name: KHALILA MISKA MRN: [id number] Date of Birth: 10-15-1941  [facility]) CM/SW Contact:    Catalina Gravel, LCSW Phone Number: 05/05/2023, 1:43 PM  Clinical Narrative:                 Pt arrived from home.  SOB concern with HF.  MD will request PT eval,potential SNF, continue to monitor for TOC needs.       Barriers to Discharge: Continued Medical Work up   Patient Goals and CMS Choice            Expected Discharge Plan and Services                                              Prior Living Arrangements/Services                       Activities of Daily Living      Permission Sought/Granted                  Emotional Assessment              Admission diagnosis:  Acute blood loss anemia [D62] Patient Active Problem List   Diagnosis Date Noted   Decompensated hepatic cirrhosis (HCC) 05/05/2023   Superior mesenteric vein thrombosis (HCC) 05/05/2023   Acute blood loss anemia 05/04/2023   Acute on chronic heart failure with preserved ejection fraction (HFpEF) (HCC) 05/04/2023   Bilateral pleural effusion 05/04/2023   Acute GI bleeding 05/04/2023   Leukopenia 05/04/2023   Thrombocytopenia (HCC) 05/04/2023   GERD (gastroesophageal reflux disease) 05/04/2023   Obstructive sleep apnea 05/04/2023   Acquired hypothyroidism 05/04/2023   Prolonged QT interval 05/04/2023   Restless leg syndrome 05/04/2023   Paroxysmal atrial flutter (HCC) 05/04/2023   Elevated INR 05/04/2023   Liver lesion thought benign 03/01/2023   Liver cirrhosis secondary to MASH (HCC)-trace ascites and 1+ varices 10/09/2020   Portal hypertension with esophageal varices (HCC) 10/09/2020   Esophageal varices without bleeding (HCC) 10/09/2020   Hoarseness 10/09/2020   Pancreatic cysts 10/09/2020   OA (osteoarthritis) of knee 06/08/2019   Osteoarthritis of left knee 06/08/2019    History of malignant neoplasm of large intestine 01/06/2013   Iron deficiency anemia 12/24/2012   PCP:  Alinda Deem, MD Pharmacy:   [facility] [address], [place] - [address] [place] [address] Phone: [phone] Fax: [phone]     Social Determinants of Health (SDOH) Social History: SDOH Screenings   Tobacco Use: Low Risk  (05/04/2023)   SDOH Interventions:     Readmission Risk Interventions     No data to display

## 2023-05-05 NOTE — Hospital Course (Addendum)
82 year old female with a history of NASH liver cirrhosis, diastolic dysfunction, GERD, OSA, hypothyroidism, chronic superior mesenteric vein thrombosis on rivaroxaban, portal hypertension, esophageal varices presenting with 1 week history of worsening shortness of breath and dyspnea on exertion.  The patient has also had some orthopnea type symptoms.  She also has noted increasing abdominal girth and lower extremity edema over the past 2 months.  Notably, the patient saw Dr. Wyline Mood in the office on 04/10/2023 for dyspnea.  She was started on furosemide 20 mg daily.  The patient endorses at least a 5 pound weight gain at home. She has not noticed any hematochezia or melena.  She denies any hematemesis or hematuria.  She has not any fevers, chills, chest pain, nausea, vomiting, diarrhea.  Notably, the patient had not EGD done on 09/02/2020 which showed grade 1 esophageal varices in the lower third esophagus, gastric polyps, and gastritis.  Colonoscopy on 09/02/2020 nonbleeding internal hemorrhoids, nonbleeding colonic angioectasias, and rectal varices.  There were also noted colon polyps.  In the ED, the patient was afebrile hemodynamically stable with oxygen saturation 97% on room air.  WBC 2.9, hemoglobin 8.9, platelets 93,000.  Sodium 131, potassium 3.7, bicarbonate 25, serum creatinine 0.84.  BNP 246.  AST 17, ALT 15, alkaline phosphatase 57, total bilirubin 1.3.  Troponin 4>> 4.  Hemoccult was positive. The patient was started on octreotide drip and pantoprazole drip.  CTA chest was negative for PE, negative dissection.  There is minimal bilateral pleural effusions.  There is small amount perihepatic ascites.  There is increased interstitial markings in the bilateral lower lobes in the posterior lung fields.  CT abdomen and pelvis with negative for obstruction.  This shows slight prominence of the pelvocaliectasis in the right kidney.  There was no obvious ureteral obstruction or stones.  There is liver  nodularity.  There is splenomegaly and a small amount of ascites.  There is minimal residual chronic SMV thrombosis.  GI was consulted to assist with management.

## 2023-05-05 NOTE — Progress Notes (Signed)
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  The patient has also had some orthopnea type symptoms.  She also has noted increasing abdominal girth and lower extremity edema over the past 2 months.  Notably, the patient saw Dr. Wyline Mood in the office on 04/10/2023 for dyspnea.  She was started on furosemide 20 mg daily.  The patient endorses at least a 5 pound weight gain at home. She has not noticed any hematochezia or melena.  She denies any hematemesis or hematuria.  She has not any fevers, chills, chest pain, nausea, vomiting, diarrhea.  Notably, the patient had not EGD done on 09/02/2020 which showed grade 1 esophageal varices in the lower third esophagus, gastric polyps, and gastritis.  Colonoscopy on 09/02/2020 nonbleeding internal hemorrhoids, nonbleeding colonic angioectasias, and rectal varices.  There were also noted colon polyps.  In the ED, the patient was afebrile hemodynamically stable with oxygen saturation 97% on room air.  WBC 2.9, hemoglobin 8.9, platelets 93,000.  Sodium 131, potassium 3.7, bicarbonate 25, serum creatinine 0.84.  BNP 246.  AST 17, ALT 15, alkaline phosphatase 57, total bilirubin 1.3.  Troponin 4>> 4.  Hemoccult was positive. The patient was started on octreotide drip and pantoprazole drip.  CTA chest was negative for PE, negative dissection.  There is minimal bilateral pleural effusions.  There is small amount perihepatic ascites.  There is increased interstitial markings in the bilateral lower lobes in the posterior lung fields.  CT abdomen and pelvis with negative for obstruction.  This shows  slight prominence of the pelvocaliectasis in the right kidney.  There was no obvious ureteral obstruction or stones.  There is liver nodularity.  There is splenomegaly and a small amount of ascites.  There is minimal residual chronic SMV thrombosis.  GI was consulted to assist with management.   Assessment/Plan: Acute on chronic HFpEF -Patient remains clinically fluid overloaded -Continue IV furosemide 40 mg IV twice daily -Personally reviewed chest x-ray--increased interstitial markings -04/02/2023 echo--EF 60-65%, grade 2 DD, no WMA, normal RVEF, trivial MR -05/05/2023 standing weight 194.1 pounds  Paroxysmal atrial fibrillation/atrial flutter -Personally reviewed telemetry--currently in sinus rhythm with PACs -personally reviewed EKG on 6/15--atrial flutter -Holding rivaroxaban in the setting of blood loss anemia -Continue home dose nadolol -Check TSH  Symptomatic anemia/heme positive stool -Octreotide and pantoprazole drips per GI -GI consult appreciated -Continue ceftriaxone for SBP prophylaxis -baseline Hgb 10-11 -presented with Hgb 8.9  Supratherapeutic INR -Hard to interpret in the setting of rivaroxaban which will also elevate INR -Holding rivaroxaban to allow washout and recheck INR after washout  Thrombocytopenia -Appears to be chronic dating back to July 2020 -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 -Check B12 and folic acid -Check TSH  Hyponatremia -due to liver cirrhosis and fluid overload -chronic 131-134 dating back to July 2020  Chronic SMV thrombosis -Holding rivaroxaban secondary to drop in hemoglobin -last dose Xarelto 6/15 10AM  NASH liver cirrhosis -Continue pantoprazole and octreotide per GI -Continue nadolol -Continue lactulose  Hypothyroidism -Continue levothyroxine  Restless leg syndrome -Continue pramipexole  Hyperglycemia -Check hemoglobin A1c  Obesity -BMI 32.47 -lifestyle modification     Family Communication:   daughter at bedside updated  6/16  Consultants:  GI  Code Status:  FULL   DVT Prophylaxis: Xarelto--on hold   Procedures: As Listed in Progress Note Above  Antibiotics: Ceftriaxone 6/16>>      Subjective: Patient states that she was breathing a little better.  She denies any fevers, chills, chest pain, nausea, vomiting, diarrhea, domino pain.  There is no dysuria or hematuria.  Objective: Vitals:   05/05/23 0045 05/05/23 0500 05/05/23 0530 05/05/23 0909  BP: (!) 115/45  129/60 120/62  Pulse: (!) 49  (!) 51 (!) 52  Resp: 16   19  Temp: 98.4 F (36.9 C)  98.5 F (36.9 C) 98.3 F (36.8 C)  TempSrc: Oral  Oral Oral  SpO2: 97%  97% 100%  Weight:  88.5 kg    Height:        Intake/Output Summary (Last 24 hours) at 05/05/2023 1119 Last data filed at 05/05/2023 0918 Gross per 24 hour  Intake 0 ml  Output 1050 ml  Net -1050 ml   Weight change:  Exam:  General:  Pt is alert, follows commands appropriately, not in acute distress HEENT: No icterus, No thrush, No neck mass, [place]/AT Cardiovascular: RRR, S1/S2, no rubs, no gallops Respiratory: Bibasilar crackles but no wheezing Abdomen: Soft/+BS, non tender, non distended, no guarding Extremities: 1 + LEedema, No lymphangitis, No petechiae, No rashes, no synovitis   Data Reviewed: I have personally reviewed following labs and imaging studies Basic Metabolic Panel: Recent Labs  Lab 05/04/23 1336 05/05/23 0438  NA 131* 134*  K 3.7 3.8  CL 98 99  CO2 25 24  GLUCOSE 107* 133*  BUN 14 13  CREATININE 0.84 0.86  CALCIUM 9.1 8.5*  MG 2.0 1.8  PHOS  --  4.3   Liver Function Tests: Recent Labs  Lab 05/04/23 1336 05/05/23 0438  AST 20 17  ALT 19 15  ALKPHOS 74 57  BILITOT 1.4* 1.3*  PROT 6.7 5.8*  ALBUMIN 4.0 3.4*   No results for input(s): "LIPASE", "AMYLASE" in the last 168 hours. Recent Labs  Lab 05/04/23 1347  AMMONIA <10   Coagulation Profile: Recent Labs  Lab 05/04/23 1336 05/05/23 1046  INR 3.0* 1.6*   CBC: Recent Labs   Lab 05/04/23 1336 05/05/23 0438  WBC 2.9* 2.4*  NEUTROABS 2.0  --   HGB 8.9* 8.2*  HCT 29.2* 26.6*  MCV 80.9 81.3  PLT 93* 83*   Cardiac Enzymes: No results for input(s): "CKTOTAL", "CKMB", "CKMBINDEX", "TROPONINI" in the last 168 hours. BNP: Invalid input(s): "POCBNP" CBG: No results for input(s): "GLUCAP" in the last 168 hours. HbA1C: No results for input(s): "HGBA1C" in the last 72 hours. Urine analysis:    Component Value Date/Time   COLORURINE COLORLESS (A) 05/04/2023 1342   APPEARANCEUR CLEAR 05/04/2023 1342   LABSPEC 1.003 (L) 05/04/2023 1342   PHURINE 7.0 05/04/2023 1342   GLUCOSEU NEGATIVE 05/04/2023 1342   HGBUR NEGATIVE 05/04/2023 1342   BILIRUBINUR NEGATIVE 05/04/2023 1342   KETONESUR NEGATIVE 05/04/2023 1342   PROTEINUR NEGATIVE 05/04/2023 1342   NITRITE NEGATIVE 05/04/2023 1342   LEUKOCYTESUR NEGATIVE 05/04/2023 1342   Sepsis Labs: @LABRCNTIP (procalcitonin:4,lacticidven:4) )No results found for this or any previous visit (from the past 240 hour(s)).   Scheduled Meds:  Chlorhexidine Gluconate Cloth  6 each Topical Daily   furosemide  40 mg Intravenous Q12H   levothyroxine  75 mcg Oral QAC breakfast   nadolol  20 mg Oral QHS   [START ON 05/08/2023] pantoprazole  40 mg Intravenous Q12H  pramipexole  0.75 mg Oral QPM   Continuous Infusions:  cefTRIAXone (ROCEPHIN)  IV 2 g (05/04/23 2239)   octreotide (SANDOSTATIN) 500 mcg in sodium chloride 0.9 % 250 mL (2 mcg/mL) infusion 50 mcg/hr (05/05/23 0934)   pantoprazole 8 mg/hr (05/05/23 0830)   phytonadione (VITAMIN K) 10 mg in dextrose 5 % 50 mL IVPB      Procedures/Studies: CT ABDOMEN PELVIS W CONTRAST  Result Date: 05/04/2023 CLINICAL DATA:  Abdominal pain and distention, cirrhosis of liver, shortness of breath EXAM: CT ABDOMEN AND PELVIS WITH CONTRAST TECHNIQUE: Multidetector CT imaging of the abdomen and pelvis was performed using the standard protocol following bolus administration of intravenous  contrast. RADIATION DOSE REDUCTION: This exam was performed according to the departmental dose-optimization program which includes automated exposure control, adjustment of the mA and/or kV according to patient size and/or use of iterative reconstruction technique. CONTRAST:  75mL OMNIPAQUE IOHEXOL 350 MG/ML SOLN COMPARISON:  12/27/2022 FINDINGS: Lower chest: Minimal bilateral pleural effusions are seen. Hepatobiliary: There is minimal nodularity in liver surface. Gallbladder is not distended. There is no wall thickening in gallbladder. Small amount of fluid is noted adjacent to the gallbladder, possibly part small perihepatic ascites. Pancreas: No focal abnormalities are seen. Spleen: Spleen measures 18.1 cm in AP diameter. There is trace amount of ascites adjacent to the spleen. Adrenals/Urinary Tract: Adrenals are unremarkable. There is no hydronephrosis in the left kidney. There is slight prominence of collecting system in the right kidney. There are no demonstrable renal or ureteral stones. Urinary bladder is unremarkable. Stomach/Bowel: Stomach is not distended. Small bowel loops are not dilated. Appendix is not seen. There is no pericecal inflammation. There is no significant wall thickening in colon. There is no pericolic stranding. Vascular/Lymphatic: Arterial calcifications are seen in aorta and its major branches. In the previous study, portal venous thrombosis was seen in SMV. There is questionable minimal residual thrombus in the SMV. There are no filling defects in portal venous branches. There is ectasia of vessels along the greater curvature and the lesser curvature aspect of stomach suggesting possible portal hypertension. Reproductive: Uterus is not seen. Other: There is minimal ascites. There is no pneumoperitoneum. Umbilical hernia containing fat is seen. Right inguinal hernia containing fat is seen. Musculoskeletal: There is minimal anterolisthesis at the L4-L5 level. There is minimal  retrolisthesis at L1-L2 and L2-L3 levels. Degenerative changes are noted with facet hypertrophy, more so in the lower lumbar spine. Spinal stenosis and encroachment of neural foramina is seen at multiple levels. IMPRESSION: There is no evidence of intestinal obstruction or pneumoperitoneum. There is slight prominence of the pelvocaliceal system in the right kidney which may be residual from previous obstruction. There are no demonstrable renal or ureteral stones. There is minimal nodularity in liver suggesting possible cirrhosis. Enlarged spleen. Small ascites. Portal hypertension. Minimal bilateral pleural effusions. There is possible minimal filling defect in superior mesenteric vein which may suggest minimal residual chronic mesenteric vein thrombosis. Aortic arteriosclerosis.  Lumbar spondylosis. Other findings as described in the body of the report. Electronically Signed   By: Ernie Avena M.D.   On: 05/04/2023 18:28   CT Angio Chest PE W and/or Wo Contrast  Result Date: 05/04/2023 CLINICAL DATA:  Shortness of breath, clinical suspicion for PE EXAM: CT ANGIOGRAPHY CHEST WITH CONTRAST TECHNIQUE: Multidetector CT imaging of the chest was performed using the standard protocol during bolus administration of intravenous contrast. Multiplanar CT image reconstructions and MIPs were obtained to evaluate the vascular anatomy. RADIATION DOSE REDUCTION: This  exam was performed according to the departmental dose-optimization program which includes automated exposure control, adjustment of the mA and/or kV according to patient size and/or use of iterative reconstruction technique. CONTRAST:  75mL OMNIPAQUE IOHEXOL 350 MG/ML SOLN COMPARISON:  Chest radiograph done today FINDINGS: Cardiovascular: There is homogeneous enhancement in thoracic aorta. There are no intraluminal filling defects in central pulmonary artery and branches. Evaluation of small peripheral branches is less than optimal. Scattered calcifications  are seen in thoracic aorta. There is dense calcification in mitral annulus. Small scattered coronary artery calcifications are seen. Mediastinum/Nodes: No significant lymphadenopathy is seen. Lungs/Pleura: There is no focal pulmonary consolidation. Increased interstitial markings are seen in the posterior aspects of both lower lung fields. Minimal bilateral pleural effusions are seen. There is no pneumothorax. Upper Abdomen: Small ascites is present in perihepatic region. AP diameter of spleen measures 17.6 cm. Musculoskeletal: Degenerative changes are noted in cervical and thoracic spine. There is encroachment of neural foramina by bony spurs in lower cervical spine. Review of the MIP images confirms the above findings. IMPRESSION: There is no evidence of pulmonary artery embolism. There is no evidence of thoracic aortic dissection. There is no focal pulmonary consolidation. Minimal bilateral pleural effusions. There is slight increase in interstitial markings in the posterior lower lung fields suggesting possible scarring or subsegmental atelectasis. Small ascites. Splenomegaly. Aortic arteriosclerosis.  Coronary artery calcifications are seen. Electronically Signed   By: Ernie Avena M.D.   On: 05/04/2023 18:11   DG Chest Port 1 View  Result Date: 05/04/2023 CLINICAL DATA:  Shortness of breath EXAM: PORTABLE CHEST 1 VIEW COMPARISON:  03/01/2023 FINDINGS: Transverse diameter of heart is increased. Tip of right subclavian chest port is seen in superior vena cava. There are no signs of pulmonary edema or focal pulmonary consolidation. There is no pleural effusion or pneumothorax. Degenerative changes are noted in both shoulders. IMPRESSION: There are no signs of pulmonary edema or focal pulmonary consolidation. Electronically Signed   By: Ernie Avena M.D.   On: 05/04/2023 14:49    Catarina Hartshorn, DO  [facility]  If 7PM-7AM, please contact night-coverage [email or web address] Password  [email or web address] 05/05/2023, 11:19 AM   LOS: 1 day

## 2023-05-05 NOTE — Progress Notes (Signed)
   05/05/23 1400  ReDS Vest / Clip  Station Marker B  Ruler Value 40  ReDS Value Range < 36  ReDS Actual Value 24

## 2023-05-05 NOTE — Consult Note (Signed)
Consulting  Provider: Dr. Tat Primary Care Physician:  Jannach, Stephen, MD Primary Gastroenterologist:  Dr. Gessner ([place])  Reason for Consultation: GI bleed, acute blood loss anemia  HPI:  Cristina Santos is a 82 y.o. 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  Also complaining of worsening bilateral lower extremity edema.  Currently taking Lasix 20 mg daily.  Was placed on spironolactone previously though this was changed by her cardiologist.  In the ER found to be hemodynamically stable.  Hemoglobin 8.9 down from baseline around 10-11.  INR 3.0.  CT abdomen pelvis with contrast which I personally reviewed showed cirrhotic findings, small ascites, splenomegaly, bilateral pleural effusions.  In regards to her SMV thrombosis this actually appeared improved on yesterday's CT imaging.  States she has been on Xarelto for approximately 2 months now.  Patient denies any gross melena or hematochezia.  Occasional abdominal discomfort.  No chronic NSAID use.  No history of peptic ulcer disease.  History of colon cancer in the 1980s status post resection.  EGD 09/02/2020 with grade 1 esophageal varices, gastritis (biopsies consistent with PHG), multiple fundic gland polyps, normal duodenum.  Chronically takes nadolol for prevention.  No history of variceal bleed prior.  Colonoscopy 09/02/2020 with external hemorrhoids, 5 polyps (mix of hyperplastic and tubular adenomas).  Multiple medium sized angioectasias in the ascending, transverse, descending, sigmoid colons.  Rectal varices noted.  Internal hemorrhoids noted.  Since admitted, started on IV Protonix, IV octreotide.  Has been receiving IV Lasix.  Hemoglobin down to 8.2 today.  INR improved to 1.6.  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 and 1+ varices 10/09/2020   Pancreatic cysts 10/09/2020   Portal hypertension (HCC)-gastropathy and colopathy 10/09/2020   Sleep apnea    Thrombosis, hepatic vein (HCC)    SMV   Thyroid disease    Vestibular migraine     Past Surgical History:  Procedure Laterality Date   ABDOMINAL HYSTERECTOMY  1987   COLON RESECTION     COLONOSCOPY     PORTACATH PLACEMENT     Never removed placed at time of colon cancer treatment   SHOULDER ARTHROSCOPY     left   TOTAL KNEE ARTHROPLASTY Left 06/08/2019   Procedure: TOTAL KNEE ARTHROPLASTY;  Surgeon: Aluisio, Frank, MD;  Location: [facility];  Service: Orthopedics;  Laterality: Left;  50min   UPPER GASTROINTESTINAL ENDOSCOPY      Prior to Admission medications   Medication Sig Start Date End Date Taking? Authorizing Provider  albuterol (VENTOLIN HFA) 108 (90 Base) MCG/ACT inhaler Inhale 1 puff into the lungs as needed.   Yes [provider]  ascorbic acid (VITAMIN C) 500 MG tablet Take 500 mg by mouth daily.   Yes [provider]  Biotin 1000 MCG tablet Take 1,000 mcg by mouth daily.   Yes [provider]  Cholecalciferol 50 MCG (2000 UT) TABS Take 2,000 Units by mouth daily.   Yes [provider]  diphenhydrAMINE-APAP, sleep, (TYLENOL PM EXTRA STRENGTH PO) Take 1   tablet by mouth as needed.   Yes [provider]  furosemide (LASIX) 20 MG tablet Take 1 tablet (20 mg total) by mouth daily. 04/10/23  Yes Branch, Jonathan F, MD  lactulose (CHRONULAC) 10 GM/15ML solution Take 10 g by mouth as needed.   Yes [provider]  levothyroxine (SYNTHROID) 75 MCG tablet Take 75 mcg by mouth daily  before breakfast.    Yes [provider]  magnesium 30 MG tablet Take 30 mg by mouth at bedtime.   Yes [provider]  Multiple Vitamins-Minerals (PRESERVISION AREDS PO) Take 1 tablet by mouth daily.   Yes [provider]  nadolol (CORGARD) 20 MG tablet Take 1 tablet by mouth once daily Patient taking differently: Take 20 mg by mouth every evening. 04/26/23  Yes Gessner, Carl E, MD  pantoprazole (PROTONIX) 40 MG tablet Take 40 mg by mouth daily.   Yes [provider]  pramipexole (MIRAPEX) 0.75 MG tablet Take 0.75 mg by mouth every evening.   Yes [provider]  triamcinolone cream (KENALOG) 0.1 % Apply topically. 04/09/23  Yes [provider]  vitamin B-12 (CYANOCOBALAMIN) 1000 MCG tablet Take 1,000 mcg by mouth daily.   Yes [provider]  XARELTO 20 MG TABS tablet Take 20 mg by mouth daily.   Yes [provider]  spironolactone (ALDACTONE) 25 MG tablet Take 1 tablet (25 mg total) by mouth daily. Patient not taking: Reported on 05/04/2023 03/01/23   Gessner, Carl E, MD  topiramate (TOPAMAX) 50 MG tablet Take 50 mg by mouth as needed. Patient not taking: Reported on 05/04/2023 05/18/22   [provider]    Current Facility-Administered Medications  Medication Dose Route Frequency Provider Last Rate Last Admin   albuterol (PROVENTIL) (2.5 MG/3ML) 0.083% nebulizer solution 2.5 mg  2.5 mg Inhalation Q2H PRN Adefeso, Oladapo, DO       cefTRIAXone (ROCEPHIN) 2 g in sodium chloride 0.9 % 100 mL IVPB  2 g Intravenous Q24H Adefeso, Oladapo, DO 200 mL/hr at 05/04/23 2239 2 g at 05/04/23 2239   Chlorhexidine Gluconate Cloth 2 % PADS 6 each  6 each Topical Daily Adefeso, Oladapo, DO   6 each at 05/05/23 0848   diphenhydrAMINE-zinc acetate (BENADRYL) 2-0.1 % cream   Topical TID PRN Adefeso, Oladapo, DO       furosemide (LASIX) injection 40 mg  40 mg Intravenous Q12H Adefeso, Oladapo, DO   40 mg at 05/05/23 0927   lactulose  (CHRONULAC) 10 GM/15ML solution 10 g  10 g Oral PRN Adefeso, Oladapo, DO       levothyroxine (SYNTHROID) tablet 75 mcg  75 mcg Oral QAC breakfast Adefeso, Oladapo, DO       nadolol (CORGARD) tablet 20 mg  20 mg Oral QHS Madueme, Elvira C, RPH       octreotide (SANDOSTATIN) 500 mcg in sodium chloride 0.9 % 250 mL (2 mcg/mL) infusion  50 mcg/hr Intravenous Continuous Adefeso, Oladapo, DO 25 mL/hr at 05/05/23 0934 50 mcg/hr at 05/05/23 0934   [START ON 05/08/2023] pantoprazole (PROTONIX) injection 40 mg  40 mg Intravenous Q12H Adefeso, Oladapo, DO       pantoprozole (PROTONIX) 80 mg /NS 100 mL infusion  8 mg/hr Intravenous Continuous Adefeso, Oladapo, DO 10 mL/hr at 05/05/23 0830 8 mg/hr at 05/05/23 0830   phytonadione (VITAMIN K) 10 mg in dextrose 5 % 50 mL IVPB  10 mg Intravenous Once Kiah Keay K, DO 51 mL/hr at 05/05/23 1124 10 mg at 05/05/23 1124     pramipexole (MIRAPEX) tablet 0.75 mg  0.75 mg Oral QPM Adefeso, Oladapo, DO       prochlorperazine (COMPAZINE) injection 10 mg  10 mg Intravenous Q6H PRN Adefeso, Oladapo, DO        Allergies as of 05/04/2023 - Review Complete 05/04/2023  Allergen Reaction Noted   Latex Anaphylaxis 12/24/2012   Codeine Palpitations 12/24/2012    Family History  Problem Relation Age of Onset   Prostate cancer Brother    Lung cancer Brother    Heart disease Brother    Bone cancer Brother    Diabetes Mother    Alzheimer's disease Mother    Diabetes Brother    Heart attack Brother    Lung cancer Brother    Bone cancer Brother    Lung cancer Sister        with metz    Ulcerative colitis Sister    Colon cancer Neg Hx    Esophageal cancer Neg Hx    Stomach cancer Neg Hx    Pancreatic cancer Neg Hx    AAA (abdominal aortic aneurysm) Neg Hx     Social History   Socioeconomic History   Marital status: Widowed    Spouse name: Not on file   Number of children: 2   Years of education: Not on file   Highest education level: Not on file  Occupational  History   Occupation: retired  Tobacco Use   Smoking status: Never   Smokeless tobacco: Never  Vaping Use   Vaping Use: Never used  Substance and Sexual Activity   Alcohol use: No   Drug use: No   Sexual activity: Not Currently  Other Topics Concern   Not on file  Social History Narrative   Married, 1 son one daughter. Daughter is a nurse practitioner.   She is retired   2 cups coffee per day   Social Determinants of Health   Financial Resource Strain: Not on file  Food Insecurity: Not on file  Transportation Needs: Not on file  Physical Activity: Not on file  Stress: Not on file  Social Connections: Not on file  Intimate Partner Violence: Not on file    Review of Systems: General: Negative for anorexia, weight loss, fever, chills, fatigue, weakness. Eyes: Negative for vision changes.  ENT: Negative for hoarseness, difficulty swallowing , nasal congestion. CV: Negative for chest pain, angina, palpitations, dyspnea on exertion, peripheral edema.  Respiratory: Positive for dyspnea, negative for cough, sputum, wheezing.  GI: See history of present illness. GU:  Negative for dysuria, hematuria, urinary incontinence, urinary frequency, nocturnal urination.  MS: Negative for joint pain, low back pain.  Derm: Negative for rash or itching.  Neuro: Negative for weakness, abnormal sensation, seizure, frequent headaches, memory loss, confusion.  Psych: Negative for anxiety, depression Endo: Negative for unusual weight change.  Heme: Negative for bruising or bleeding. Allergy: Negative for rash or hives.  Physical Exam: Vital signs in last 24 hours: Temp:  [97.5 F (36.4 C)-98.5 F (36.9 C)] 98.3 F (36.8 C) (06/16 0909) Pulse Rate:  [49-115] 52 (06/16 0909) Resp:  [11-26] 19 (06/16 0909) BP: (115-169)/(45-90) 120/62 (06/16 0909) SpO2:  [95 %-100 %] 100 % (06/16 0909) FiO2 (%):  [21 %] 21 % (06/15 2328) Weight:  [88.5 kg-90.7 kg] 88.5 kg (06/16 0500) Last BM Date :  05/04/23 General:   Alert,  Well-developed, well-nourished, pleasant and cooperative in NAD Head:  Normocephalic and atraumatic. Eyes:  Sclera clear, no icterus.   Conjunctiva pink. Ears:    Normal auditory acuity. Nose:  No deformity, discharge,  or lesions. Mouth:  No deformity or lesions, dentition normal. Neck:  Supple; no masses or thyromegaly. Lungs: Crackles bilateral lower lobes.  No wheezing.  No acute distress. Heart:  Regular rate and rhythm; no murmurs, clicks, rubs,  or gallops. Abdomen:  Soft, nontender and nondistended. No masses, hepatosplenomegaly or hernias noted. Normal bowel sounds, without guarding, and without rebound.   Msk:  Symmetrical without gross deformities. Normal posture. Pulses:  Normal pulses noted. Extremities:  Without clubbing or edema. Neurologic:  Alert and  oriented x4;  grossly normal neurologically. Skin:  Intact without significant lesions or rashes. Cervical Nodes:  No significant cervical adenopathy. Psych:  Alert and cooperative. Normal mood and affect.  Intake/Output from previous day: 06/15 0701 - 06/16 0700 In: -  Out: 1050 [Urine:1050] Intake/Output this shift: Total I/O In: 0  Out: 600 [Urine:600]  Lab Results: Recent Labs    05/04/23 1336 05/05/23 0438  WBC 2.9* 2.4*  HGB 8.9* 8.2*  HCT 29.2* 26.6*  PLT 93* 83*   BMET Recent Labs    05/04/23 1336 05/05/23 0438  NA 131* 134*  K 3.7 3.8  CL 98 99  CO2 25 24  GLUCOSE 107* 133*  BUN 14 13  CREATININE 0.84 0.86  CALCIUM 9.1 8.5*   LFT Recent Labs    05/04/23 1336 05/05/23 0438  PROT 6.7 5.8*  ALBUMIN 4.0 3.4*  AST 20 17  ALT 19 15  ALKPHOS 74 57  BILITOT 1.4* 1.3*   PT/INR Recent Labs    05/04/23 1336 05/05/23 1046  LABPROT 31.6* 19.0*  INR 3.0* 1.6*   Hepatitis Panel No results for input(s): "HEPBSAG", "HCVAB", "HEPAIGM", "HEPBIGM" in the last 72 hours. C-Diff No results for input(s): "CDIFFTOX" in the last 72 hours.  Studies/Results: CT ABDOMEN  PELVIS W CONTRAST  Result Date: 05/04/2023 CLINICAL DATA:  Abdominal pain and distention, cirrhosis of liver, shortness of breath EXAM: CT ABDOMEN AND PELVIS WITH CONTRAST TECHNIQUE: Multidetector CT imaging of the abdomen and pelvis was performed using the standard protocol following bolus administration of intravenous contrast. RADIATION DOSE REDUCTION: This exam was performed according to the departmental dose-optimization program which includes automated exposure control, adjustment of the mA and/or kV according to patient size and/or use of iterative reconstruction technique. CONTRAST:  75mL OMNIPAQUE IOHEXOL 350 MG/ML SOLN COMPARISON:  12/27/2022 FINDINGS: Lower chest: Minimal bilateral pleural effusions are seen. Hepatobiliary: There is minimal nodularity in liver surface. Gallbladder is not distended. There is no wall thickening in gallbladder. Small amount of fluid is noted adjacent to the gallbladder, possibly part small perihepatic ascites. Pancreas: No focal abnormalities are seen. Spleen: Spleen measures 18.1 cm in AP diameter. There is trace amount of ascites adjacent to the spleen. Adrenals/Urinary Tract: Adrenals are unremarkable. There is no hydronephrosis in the left kidney. There is slight prominence of collecting system in the right kidney. There are no demonstrable renal or ureteral stones. Urinary bladder is unremarkable. Stomach/Bowel: Stomach is not distended. Small bowel loops are not dilated. Appendix is not seen. There is no pericecal inflammation. There is no significant wall thickening in colon. There is no pericolic stranding. Vascular/Lymphatic: Arterial calcifications are seen in aorta and its major branches. In the previous study, portal venous thrombosis was seen in SMV. There is questionable minimal residual thrombus in the SMV. There are no filling defects in portal venous branches. There is ectasia of vessels along the greater curvature and the lesser curvature aspect of    stomach suggesting possible portal hypertension. Reproductive: Uterus is not seen. Other: There is minimal ascites. There is no pneumoperitoneum. Umbilical hernia containing fat is seen. Right inguinal hernia containing fat is seen. Musculoskeletal: There is minimal anterolisthesis at the L4-L5 level. There is minimal retrolisthesis at L1-L2 and L2-L3 levels. Degenerative changes are noted with facet hypertrophy, more so in the lower lumbar spine. Spinal stenosis and encroachment of neural foramina is seen at multiple levels. IMPRESSION: There is no evidence of intestinal obstruction or pneumoperitoneum. There is slight prominence of the pelvocaliceal system in the right kidney which may be residual from previous obstruction. There are no demonstrable renal or ureteral stones. There is minimal nodularity in liver suggesting possible cirrhosis. Enlarged spleen. Small ascites. Portal hypertension. Minimal bilateral pleural effusions. There is possible minimal filling defect in superior mesenteric vein which may suggest minimal residual chronic mesenteric vein thrombosis. Aortic arteriosclerosis.  Lumbar spondylosis. Other findings as described in the body of the report. Electronically Signed   By: Palani  Rathinasamy M.D.   On: 05/04/2023 18:28   CT Angio Chest PE W and/or Wo Contrast  Result Date: 05/04/2023 CLINICAL DATA:  Shortness of breath, clinical suspicion for PE EXAM: CT ANGIOGRAPHY CHEST WITH CONTRAST TECHNIQUE: Multidetector CT imaging of the chest was performed using the standard protocol during bolus administration of intravenous contrast. Multiplanar CT image reconstructions and MIPs were obtained to evaluate the vascular anatomy. RADIATION DOSE REDUCTION: This exam was performed according to the departmental dose-optimization program which includes automated exposure control, adjustment of the mA and/or kV according to patient size and/or use of iterative reconstruction technique. CONTRAST:  75mL  OMNIPAQUE IOHEXOL 350 MG/ML SOLN COMPARISON:  Chest radiograph done today FINDINGS: Cardiovascular: There is homogeneous enhancement in thoracic aorta. There are no intraluminal filling defects in central pulmonary artery and branches. Evaluation of small peripheral branches is less than optimal. Scattered calcifications are seen in thoracic aorta. There is dense calcification in mitral annulus. Small scattered coronary artery calcifications are seen. Mediastinum/Nodes: No significant lymphadenopathy is seen. Lungs/Pleura: There is no focal pulmonary consolidation. Increased interstitial markings are seen in the posterior aspects of both lower lung fields. Minimal bilateral pleural effusions are seen. There is no pneumothorax. Upper Abdomen: Small ascites is present in perihepatic region. AP diameter of spleen measures 17.6 cm. Musculoskeletal: Degenerative changes are noted in cervical and thoracic spine. There is encroachment of neural foramina by bony spurs in lower cervical spine. Review of the MIP images confirms the above findings. IMPRESSION: There is no evidence of pulmonary artery embolism. There is no evidence of thoracic aortic dissection. There is no focal pulmonary consolidation. Minimal bilateral pleural effusions. There is slight increase in interstitial markings in the posterior lower lung fields suggesting possible scarring or subsegmental atelectasis. Small ascites. Splenomegaly. Aortic arteriosclerosis.  Coronary artery calcifications are seen. Electronically Signed   By: Palani  Rathinasamy M.D.   On: 05/04/2023 18:11   DG Chest Port 1 View  Result Date: 05/04/2023 CLINICAL DATA:  Shortness of breath EXAM: PORTABLE CHEST 1 VIEW COMPARISON:  03/01/2023 FINDINGS: Transverse diameter of heart is increased. Tip of right subclavian chest port is seen in superior vena cava. There are no signs of pulmonary edema or focal pulmonary consolidation. There is no pleural effusion or pneumothorax.  Degenerative changes are noted in both shoulders. IMPRESSION: There are no signs of pulmonary edema or focal pulmonary consolidation. Electronically Signed   By: Palani  Rathinasamy M.D.   On: 05/04/2023 14:49    Assessment/Plan:  1.    Suspected GI bleeding-source unclear.  No gross melena or hematochezia.  Heme positive stool with worsening anemia.  History of grade 1 esophageal varices.  Also with multiple colonic AVMs on previous colonoscopy.  Continue on IV Protonix.  Continue on octreotide until variceal hemorrhage can be safely ruled out though this seems less likely given her clinical presentation.  Will tentatively plan on upper endoscopy and colonoscopy to further evaluate tomorrow.  Prep colon tonight in anticipation.  Clear liquid diet today.  N.p.o. after midnight.  2.  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  Continue daily LFTs and coags.  Follow-up with primary GI as outpatient.  3..  Acute blood loss anemia due to above-continue monitor H&H and transfuse for less than 7.  4.  Coagulopathy-likely multifactorial in the setting of chronic Xarelto use, cirrhosis.  Repeat INR today much improved at 1.6 down from 3.  Continue to monitor.  5.  Hypervolemia-also likely multifactorial in the setting of diastolic heart failure as well as cirrhosis.  Continue diuresis.  Monitor creatinine and electrolytes.  6.  SMV thrombus-CT imaging shows improvement in thrombus.  Hold Xarelto for now given suspected GI bleeding.  Further recommendations to follow in this regard after endoscopic evaluation.  Thank you for the consultation   Ermal Brzozowski K. Sumiye Hirth, D.O. Gastroenterology and Hepatology [facility]    LOS: 1 day     05/05/2023, 11:33 AM    

## 2023-05-05 NOTE — Progress Notes (Signed)
*  PRELIMINARY RESULTS* Echocardiogram Limited 2-D Echocardiogram  has been performed.  Stacey Drain 05/05/2023, 12:50 PM

## 2023-05-06 ENCOUNTER — Encounter: Payer: Self-pay | Admitting: *Deleted

## 2023-05-06 ENCOUNTER — Other Ambulatory Visit: Payer: Self-pay | Admitting: *Deleted

## 2023-05-06 ENCOUNTER — Inpatient Hospital Stay (HOSPITAL_COMMUNITY): Payer: MEDICARE | Admitting: Anesthesiology

## 2023-05-06 ENCOUNTER — Encounter (HOSPITAL_COMMUNITY): Payer: Self-pay | Admitting: Internal Medicine

## 2023-05-06 ENCOUNTER — Telehealth: Payer: Self-pay | Admitting: Gastroenterology

## 2023-05-06 ENCOUNTER — Encounter: Payer: Self-pay | Admitting: Internal Medicine

## 2023-05-06 ENCOUNTER — Encounter (HOSPITAL_COMMUNITY)
Admission: EM | Disposition: A | Discharge status: Home / Self Care | Payer: Self-pay | Source: Home / Self Care | Attending: Internal Medicine

## 2023-05-06 ENCOUNTER — Inpatient Hospital Stay: Payer: MEDICARE

## 2023-05-06 DIAGNOSIS — K317 Polyp of stomach and duodenum: Secondary | ICD-10-CM | POA: Diagnosis not present

## 2023-05-06 DIAGNOSIS — I8510 Secondary esophageal varices without bleeding: Secondary | ICD-10-CM

## 2023-05-06 DIAGNOSIS — K2970 Gastritis, unspecified, without bleeding: Secondary | ICD-10-CM | POA: Diagnosis not present

## 2023-05-06 DIAGNOSIS — I5033 Acute on chronic diastolic (congestive) heart failure: Secondary | ICD-10-CM | POA: Diagnosis not present

## 2023-05-06 DIAGNOSIS — K648 Other hemorrhoids: Secondary | ICD-10-CM

## 2023-05-06 DIAGNOSIS — I5031 Acute diastolic (congestive) heart failure: Secondary | ICD-10-CM

## 2023-05-06 DIAGNOSIS — K766 Portal hypertension: Secondary | ICD-10-CM | POA: Diagnosis not present

## 2023-05-06 DIAGNOSIS — D696 Thrombocytopenia, unspecified: Secondary | ICD-10-CM | POA: Diagnosis not present

## 2023-05-06 DIAGNOSIS — D500 Iron deficiency anemia secondary to blood loss (chronic): Secondary | ICD-10-CM | POA: Diagnosis not present

## 2023-05-06 DIAGNOSIS — D122 Benign neoplasm of ascending colon: Secondary | ICD-10-CM

## 2023-05-06 DIAGNOSIS — K5730 Diverticulosis of large intestine without perforation or abscess without bleeding: Secondary | ICD-10-CM

## 2023-05-06 DIAGNOSIS — I4892 Unspecified atrial flutter: Secondary | ICD-10-CM

## 2023-05-06 DIAGNOSIS — D126 Benign neoplasm of colon, unspecified: Secondary | ICD-10-CM

## 2023-05-06 DIAGNOSIS — K5520 Angiodysplasia of colon without hemorrhage: Secondary | ICD-10-CM | POA: Diagnosis not present

## 2023-05-06 DIAGNOSIS — K3189 Other diseases of stomach and duodenum: Secondary | ICD-10-CM

## 2023-05-06 DIAGNOSIS — D123 Benign neoplasm of transverse colon: Secondary | ICD-10-CM

## 2023-05-06 DIAGNOSIS — R195 Other fecal abnormalities: Secondary | ICD-10-CM | POA: Diagnosis not present

## 2023-05-06 HISTORY — PX: HOT HEMOSTASIS: SHX5433

## 2023-05-06 HISTORY — PX: COLONOSCOPY WITH PROPOFOL: SHX5780

## 2023-05-06 HISTORY — PX: ESOPHAGOGASTRODUODENOSCOPY (EGD) WITH PROPOFOL: SHX5813

## 2023-05-06 HISTORY — PX: POLYPECTOMY: SHX5525

## 2023-05-06 HISTORY — PX: BIOPSY: SHX5522

## 2023-05-06 LAB — BASIC METABOLIC PANEL
Anion gap: 8 (ref 5–15)
BUN: 11 mg/dL (ref 8–23)
CO2: 28 mmol/L (ref 22–32)
Calcium: 8.2 mg/dL — ABNORMAL LOW (ref 8.9–10.3)
Chloride: 96 mmol/L — ABNORMAL LOW (ref 98–111)
Creatinine, Ser: 0.83 mg/dL (ref 0.44–1.00)
GFR, Estimated: >60 mL/min (ref >60)
Glucose, Bld: 119 mg/dL — ABNORMAL HIGH (ref 70–99)
Potassium: 3.4 mmol/L — ABNORMAL LOW (ref 3.5–5.1)
Sodium: 132 mmol/L — ABNORMAL LOW (ref 135–145)

## 2023-05-06 LAB — PROTIME-INR
INR: 1.4 — ABNORMAL HIGH (ref 0.8–1.2)
Prothrombin Time: 17.7 seconds — ABNORMAL HIGH (ref 11.4–15.2)

## 2023-05-06 LAB — HEMOGLOBIN A1C
Hgb A1c MFr Bld: 6.3 % — ABNORMAL HIGH (ref 4.8–5.6)
Mean Plasma Glucose: 134.11 mg/dL

## 2023-05-06 LAB — CBC
HCT: 26.9 % — ABNORMAL LOW (ref 36.0–46.0)
Hemoglobin: 8.1 g/dL — ABNORMAL LOW (ref 12.0–15.0)
MCH: 24.7 pg — ABNORMAL LOW (ref 26.0–34.0)
MCHC: 30.1 g/dL (ref 30.0–36.0)
MCV: 82 fL (ref 80.0–100.0)
Platelets: 90 10*3/uL — ABNORMAL LOW (ref 150–400)
RBC: 3.28 MIL/uL — ABNORMAL LOW (ref 3.87–5.11)
RDW: 15.1 % (ref 11.5–15.5)
WBC: 2.3 10*3/uL — ABNORMAL LOW (ref 4.0–10.5)
nRBC: 0 % (ref 0.0–0.2)

## 2023-05-06 LAB — IRON AND TIBC
Iron: 28 ug/dL (ref 28–170)
Saturation Ratios: 7 % — ABNORMAL LOW (ref 10.4–31.8)
TIBC: 416 ug/dL (ref 250–450)
UIBC: 388 ug/dL

## 2023-05-06 LAB — MAGNESIUM: Magnesium: 2.1 mg/dL (ref 1.7–2.4)

## 2023-05-06 LAB — TSH: TSH: 1.282 u[IU]/mL (ref 0.350–4.500)

## 2023-05-06 LAB — VITAMIN B12: Vitamin B-12: 1224 pg/mL — ABNORMAL HIGH (ref 180–914)

## 2023-05-06 LAB — FERRITIN: Ferritin: 7 ng/mL — ABNORMAL LOW (ref 11–307)

## 2023-05-06 LAB — FOLATE: Folate: 9.8 ng/mL (ref >5.9)

## 2023-05-06 SURGERY — ESOPHAGOGASTRODUODENOSCOPY (EGD) WITH PROPOFOL
Anesthesia: General

## 2023-05-06 MED ORDER — PANTOPRAZOLE SODIUM 40 MG PO TBEC: 40.0000 mg | DELAYED_RELEASE_TABLET | Freq: Two times a day (BID) | ORAL | 1 refills | Status: DC

## 2023-05-06 MED ORDER — XARELTO 20 MG PO TABS: 20.0000 mg | ORAL_TABLET | Freq: Every day | ORAL | Status: DC

## 2023-05-06 MED ORDER — PROPOFOL 10 MG/ML IV BOLUS
INTRAVENOUS | Status: DC | PRN
  Administered 2023-05-06: 40 mg via INTRAVENOUS

## 2023-05-06 MED ORDER — SODIUM CHLORIDE 0.9 % IV SOLN
250.0000 mg | Freq: Once | INTRAVENOUS | Status: AC
  Administered 2023-05-06: 250 mg via INTRAVENOUS
  Filled 2023-05-06: qty 250

## 2023-05-06 MED ORDER — FUROSEMIDE 40 MG PO TABS: 40.0000 mg | ORAL_TABLET | Freq: Every day | ORAL | 1 refills | Status: AC

## 2023-05-06 MED ORDER — EPHEDRINE SULFATE (PRESSORS) 50 MG/ML IJ SOLN
INTRAMUSCULAR | Status: DC | PRN
  Administered 2023-05-06 (×2): 5 mg via INTRAVENOUS
  Administered 2023-05-06: 10 mg via INTRAVENOUS

## 2023-05-06 MED ORDER — FERROUS SULFATE 325 (65 FE) MG PO TABS: 325.0000 mg | ORAL_TABLET | Freq: Two times a day (BID) | ORAL | 3 refills | Status: DC

## 2023-05-06 MED ORDER — HEPARIN SOD (PORK) LOCK FLUSH 100 UNIT/ML IV SOLN: 500.0000 [IU] | Freq: Once | INTRAVENOUS | Status: DC

## 2023-05-06 MED ORDER — POTASSIUM CHLORIDE CRYS ER 20 MEQ PO TBCR
20.0000 meq | EXTENDED_RELEASE_TABLET | Freq: Once | ORAL | Status: AC
  Administered 2023-05-06: 20 meq via ORAL
  Filled 2023-05-06: qty 1

## 2023-05-06 MED ORDER — FERROUS SULFATE 325 (65 FE) MG PO TABS: 325.0000 mg | ORAL_TABLET | Freq: Two times a day (BID) | ORAL | Status: DC

## 2023-05-06 MED ORDER — SODIUM CHLORIDE 0.9 % IV SOLN: INTRAVENOUS | Status: DC

## 2023-05-06 MED ORDER — PANTOPRAZOLE SODIUM 40 MG PO TBEC
40.0000 mg | DELAYED_RELEASE_TABLET | Freq: Two times a day (BID) | ORAL | Status: DC
  Administered 2023-05-06: 40 mg via ORAL
  Filled 2023-05-06: qty 1

## 2023-05-06 MED ORDER — LACTATED RINGERS IV SOLN: INTRAVENOUS | Status: DC

## 2023-05-06 MED ORDER — HEPARIN SOD (PORK) LOCK FLUSH 100 UNIT/ML IV SOLN
INTRAVENOUS | Status: AC
  Administered 2023-05-06: 500 [IU]
  Filled 2023-05-06: qty 5

## 2023-05-06 MED ORDER — PROPOFOL 500 MG/50ML IV EMUL
INTRAVENOUS | Status: DC | PRN
  Administered 2023-05-06: 100 ug/kg/min via INTRAVENOUS
  Administered 2023-05-06: 150 ug/kg/min via INTRAVENOUS

## 2023-05-06 MED ORDER — LIDOCAINE HCL (CARDIAC) PF 100 MG/5ML IV SOSY
PREFILLED_SYRINGE | INTRAVENOUS | Status: DC | PRN
  Administered 2023-05-06: 50 mg via INTRAVENOUS
  Administered 2023-05-06: 20 mg via INTRAVENOUS

## 2023-05-06 NOTE — Anesthesia Preprocedure Evaluation (Signed)
Anesthesia Evaluation  Patient identified by MRN, date of birth, ID band Patient awake    Reviewed: Allergy & Precautions, H&P , NPO status , Patient's Chart, lab work & pertinent test results, reviewed documented beta blocker date and time   History of Anesthesia Complications Negative for: history of anesthetic complications  Airway Mallampati: II  TM Distance: >3 FB Neck ROM: Full    Dental  (+) Missing, Implants, Dental Advisory Given   Pulmonary sleep apnea    Pulmonary exam normal breath sounds clear to auscultation       Cardiovascular Exercise Tolerance: Good +CHF  Normal cardiovascular exam+ dysrhythmias Atrial Fibrillation  Rhythm:Regular Rate:Normal     Neuro/Psych  Headaches  Neuromuscular disease (RLS)  negative psych ROS   GI/Hepatic ,GERD  Controlled and Medicated,,(+) Cirrhosis   Esophageal Varices and ascites    , Hepatitis -Colon cancer Acute GI bleeding   Endo/Other  diabetes, Well Controlled, Type 2, Oral Hypoglycemic AgentsHypothyroidism    Renal/GU negative Renal ROS  negative genitourinary   Musculoskeletal  (+) Arthritis , Osteoarthritis,    Abdominal   Peds negative pediatric ROS (+)  Hematology  (+) Blood dyscrasia (elevated INR), anemia   Anesthesia Other Findings   Reproductive/Obstetrics negative OB ROS                             Anesthesia Physical Anesthesia Plan  ASA: 3 and emergent  Anesthesia Plan: General   Post-op Pain Management: Minimal or no pain anticipated   Induction: Intravenous  PONV Risk Score and Plan: Propofol infusion  Airway Management Planned: Nasal Cannula and Natural Airway  Additional Equipment:   Intra-op Plan:   Post-operative Plan:   Informed Consent: I have reviewed the patients History and Physical, chart, labs and discussed the procedure including the risks, benefits and alternatives for the proposed  anesthesia with the patient or authorized representative who has indicated his/her understanding and acceptance.     Dental advisory given  Plan Discussed with: CRNA and Surgeon  Anesthesia Plan Comments:        Anesthesia Quick Evaluation

## 2023-05-06 NOTE — Telephone Encounter (Signed)
Mindy/Tammy:  Can you please refer patient to Dr. Kirtland Bouchard with Hem/Onc due to IDA? Thanks! Patient will also need follow-up as outpatient with Dr. Leone Payor.

## 2023-05-06 NOTE — Plan of Care (Signed)
  Problem: Education: Goal: Knowledge of General Education information will improve Description Including pain rating scale, medication(s)/side effects and non-pharmacologic comfort measures Outcome: Progressing   Problem: Education: Goal: Knowledge of General Education information will improve Description Including pain rating scale, medication(s)/side effects and non-pharmacologic comfort measures Outcome: Progressing   Problem: Health Behavior/Discharge Planning: Goal: Ability to manage health-related needs will improve Outcome: Progressing   Problem: Clinical Measurements: Goal: Ability to maintain clinical measurements within normal limits will improve Outcome: Progressing   

## 2023-05-06 NOTE — Progress Notes (Signed)
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  The patient has also had some orthopnea type symptoms.  She also has noted increasing abdominal girth and lower extremity edema over the past 2 months.  Notably, the patient saw Dr. Wyline Mood in the office on 04/10/2023 for dyspnea.  She was started on furosemide 20 mg daily.  The patient endorses at least a 5 pound weight gain at home. She has not noticed any hematochezia or melena.  She denies any hematemesis or hematuria.  She has not any fevers, chills, chest pain, nausea, vomiting, diarrhea.  Notably, the patient had not EGD done on 09/02/2020 which showed grade 1 esophageal varices in the lower third esophagus, gastric polyps, and gastritis.  Colonoscopy on 09/02/2020 nonbleeding internal hemorrhoids, nonbleeding colonic angioectasias, and rectal varices.  There were also noted colon polyps.  In the ED, the patient was afebrile hemodynamically stable with oxygen saturation 97% on room air.  WBC 2.9, hemoglobin 8.9, platelets 93,000.  Sodium 131, potassium 3.7, bicarbonate 25, serum creatinine 0.84.  BNP 246.  AST 17, ALT 15, alkaline phosphatase 57, total bilirubin 1.3.  Troponin 4>> 4.  Hemoccult was positive. The patient was started on octreotide drip and pantoprazole drip.  CTA chest was negative for PE, negative dissection.  There is minimal bilateral pleural effusions.  There is small amount perihepatic ascites.  There is increased interstitial markings in the bilateral lower lobes in the posterior lung fields.  CT abdomen and pelvis with negative for obstruction.  This shows  slight prominence of the pelvocaliectasis in the right kidney.  There was no obvious ureteral obstruction or stones.  There is liver nodularity.  There is splenomegaly and a small amount of ascites.  There is minimal residual chronic SMV thrombosis.  GI was consulted to assist with management.   Assessment/Plan: Acute on chronic HFpEF -Patient remains clinically fluid overloaded -Continue IV furosemide 40 mg IV twice daily -Personally reviewed chest x-ray--increased interstitial markings -04/02/2023 echo--EF 60-65%, grade 2 DD, no WMA, normal RVEF, trivial MR -05/05/23 echo EF 60-65% -05/05/2023 standing weight 194.1 pounds -05/06/2023 standing weight 194.4 -ReDS vest = 24 -transition to po lasix   Paroxysmal atrial fibrillation/atrial flutter -Personally reviewed telemetry--currently in sinus rhythm with PACs -personally reviewed EKG on 6/15--atrial flutter -Holding rivaroxaban in the setting of blood loss anemia -Continue home dose nadolol -Check TSH-1.282   Symptomatic anemia/heme positive stool -Octreotide and pantoprazole drips per GI -GI consult appreciated -Continue ceftriaxone for SBP prophylaxis -baseline Hgb 10-11 -presented with Hgb 8.9>>8.2>>8.1   Supratherapeutic INR -Hard to interpret in the setting of rivaroxaban which will also elevate INR -Holding rivaroxaban to allow washout and recheck INR after washout -05/06/23 INR 1.4   Thrombocytopenia -Appears to be chronic dating back to July 2020 -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 -Check B12 --1224 -folate 9.8 -Check TSH--1.282   Hyponatremia -due to liver cirrhosis and fluid overload -chronic 131-134 dating back to July 2020   Chronic SMV thrombosis -Holding rivaroxaban secondary to drop in hemoglobin -last dose Xarelto 6/15 10AM   NASH liver cirrhosis with decompensation (fluid overload) -Continue pantoprazole and octreotide per GI -Continue nadolol -Continue lactulose   Hypothyroidism -Continue  levothyroxine    Restless leg syndrome/Iron deficiency -iron saturation 7, ferritin 7 -Continue pramipexole -give nulecit   Hyperglycemia -Check hemoglobin A1c   Obesity -BMI 32.47 -lifestyle modification         Family Communication:   daughter at bedside updated 6/17   Consultants:  GI   Code Status:  FULL    DVT Prophylaxis: Xarelto--on hold     Procedures: As Listed in Progress Note Above   Antibiotics: Ceftriaxone 6/16>>        Subjective: Patient denies fevers, chills, headache, chest pain, dyspnea, nausea, vomiting, diarrhea, abdominal pain, dysuria, hematuria, hematochezia, and melena.   Objective: Vitals:   05/05/23 1919 05/06/23 0045 05/06/23 0353 05/06/23 0500  BP: (!) 131/58  (!) 106/46   Pulse: (!) 53 (!) 52 (!) 51   Resp: 18 16 16    Temp: 97.7 F (36.5 C)  98 F (36.7 C)   TempSrc: Oral  Oral   SpO2: 100% 96% 96%   Weight:    88.8 kg  Height:        Intake/Output Summary (Last 24 hours) at 05/06/2023 0824 Last data filed at 05/06/2023 0323 Gross per 24 hour  Intake 1455.78 ml  Output 1500 ml  Net -44.22 ml   Weight change: -1.919 kg Exam:  General:  Pt is alert, follows commands appropriately, not in acute distress HEENT: No icterus, No thrush, No neck mass, [place]/AT Cardiovascular: RRR, S1/S2, no rubs, no gallops Respiratory: CTA bilaterally, no wheezing, no crackles, no rhonchi Abdomen: Soft/+BS, non tender, non distended, no guarding Extremities: trace LE edema, No lymphangitis, No petechiae, No rashes, no synovitis   Data Reviewed: I have personally reviewed following labs and imaging studies Basic Metabolic Panel: Recent Labs  Lab 05/04/23 1336 05/05/23 0438 05/06/23 0410  NA 131* 134* 132*  K 3.7 3.8 3.4*  CL 98 99 96*  CO2 25 24 28   GLUCOSE 107* 133* 119*  BUN 14 13 11   CREATININE 0.84 0.86 0.83  CALCIUM 9.1 8.5* 8.2*  MG 2.0 1.8 2.1  PHOS  --  4.3  --    Liver Function Tests: Recent Labs  Lab 05/04/23 1336 05/05/23 0438   AST 20 17  ALT 19 15  ALKPHOS 74 57  BILITOT 1.4* 1.3*  PROT 6.7 5.8*  ALBUMIN 4.0 3.4*   No results for input(s): "LIPASE", "AMYLASE" in the last 168 hours. Recent Labs  Lab 05/04/23 1347  AMMONIA <10   Coagulation Profile: Recent Labs  Lab 05/04/23 1336 05/05/23 1046 05/06/23 0410  INR 3.0* 1.6* 1.4*   CBC: Recent Labs  Lab 05/04/23 1336 05/05/23 0438 05/06/23 0410  WBC 2.9* 2.4* 2.3*  NEUTROABS 2.0  --   --   HGB 8.9* 8.2* 8.1*  HCT 29.2* 26.6* 26.9*  MCV 80.9 81.3 82.0  PLT 93* 83* 90*   Cardiac Enzymes: No results for input(s): "CKTOTAL", "CKMB", "CKMBINDEX", "TROPONINI" in the last 168 hours. BNP: Invalid input(s): "POCBNP" CBG: No results for input(s): "GLUCAP" in the last 168 hours. HbA1C: No results for input(s): "HGBA1C" in the last 72 hours. Urine analysis:    Component Value Date/Time   COLORURINE COLORLESS (A) 05/04/2023 1342   APPEARANCEUR CLEAR 05/04/2023 1342   LABSPEC 1.003 (L) 05/04/2023 1342   PHURINE 7.0 05/04/2023 1342   GLUCOSEU NEGATIVE 05/04/2023 1342   HGBUR NEGATIVE 05/04/2023 1342   BILIRUBINUR NEGATIVE 05/04/2023 1342   KETONESUR NEGATIVE 05/04/2023 1342   PROTEINUR NEGATIVE 05/04/2023 1342   NITRITE NEGATIVE 05/04/2023 1342  LEUKOCYTESUR NEGATIVE 05/04/2023 1342   Sepsis Labs: @LABRCNTIP (procalcitonin:4,lacticidven:4) )No results found for this or any previous visit (from the past 240 hour(s)).   Scheduled Meds:  Chlorhexidine Gluconate Cloth  6 each Topical Daily   furosemide  40 mg Oral Daily   levothyroxine  75 mcg Oral QAC breakfast   nadolol  20 mg Oral QHS   [START ON 05/08/2023] pantoprazole  40 mg Intravenous Q12H   pramipexole  0.75 mg Oral QPM   Continuous Infusions:  cefTRIAXone (ROCEPHIN)  IV Stopped (05/05/23 2222)   octreotide (SANDOSTATIN) 500 mcg in sodium chloride 0.9 % 250 mL (2 mcg/mL) infusion 50 mcg/hr (05/06/23 0652)   pantoprazole 8 mg/hr (05/06/23 0323)     Procedures/Studies: ECHOCARDIOGRAM LIMITED  Result Date: 05/05/2023    ECHOCARDIOGRAM LIMITED REPORT   Patient Name:   Cristina Santos Date of Exam: 05/05/2023 Medical Rec #:  [id number]    Height:       65.0 in Accession #:    [id number]   Weight:       195.1 lb Date of Birth:  08/06/1941   BSA:          1.957 m Patient Age:    81 years     BP:           129/60 mmHg Patient Gender: F            HR:           51 bpm. [facility] Procedure: Limited Echo Indications:    CHF-Acute Diastolic I50.31  History:        Patient has prior history of Echocardiogram examinations, most                 recent 04/02/2023. Arrythmias:Atrial Flutter. Acute on chronic                 heart failure with preserved ejection fraction (HFpEF) (HCC).                 Obstructive sleep apnea.  Sonographer:    Celesta Gentile RCS Referring Phys: [id number] OLADAPO ADEFESO IMPRESSIONS  1. Limited echo no color/doppler of valves done.  2. Left ventricular ejection fraction, by estimation, is 60 to 65%. The left ventricle has normal function.  3. Left atrial size was mildly dilated.  4. The mitral valve is abnormal. Moderate mitral annular calcification.  5. The aortic valve is tricuspid. There is moderate calcification of the aortic valve. There is moderate thickening of the aortic valve. Aortic valve sclerosis/calcification is present, without any evidence of aortic stenosis.  6. The inferior vena cava is normal in size with <50% respiratory variability, suggesting right atrial pressure of 8 mmHg. FINDINGS  Left Ventricle: Left ventricular ejection fraction, by estimation, is 60 to 65%. The left ventricle has normal function. The left ventricular internal cavity size was normal in size. There is no left ventricular hypertrophy. Left Atrium: Left atrial size was mildly dilated. Pericardium: There is no evidence of pericardial effusion. Mitral Valve: The mitral valve is abnormal. There is mild thickening of the mitral valve  leaflet(s). There is mild calcification of the mitral valve leaflet(s). Moderate mitral annular calcification. Aortic Valve: The aortic valve is tricuspid. There is moderate calcification of the aortic valve. There is moderate thickening of the aortic valve. Aortic valve sclerosis/calcification is present, without any evidence of aortic stenosis. Venous: The inferior vena cava is normal in size with less than 50% respiratory variability, suggesting right atrial  pressure of 8 mmHg. IAS/Shunts: No atrial level shunt detected by color flow Doppler. Additional Comments: Limited echo no color/doppler of valves done.  LEFT VENTRICLE PLAX 2D LVIDd:         4.40 cm LVIDs:         2.30 cm LV PW:         1.10 cm LV IVS:        1.00 cm LVOT diam:     2.00 cm LVOT Area:     3.14 cm  LEFT ATRIUM         Index LA diam:    4.10 cm 2.09 cm/m   AORTA Ao Root diam: 3.60 cm  SHUNTS Systemic Diam: 2.00 cm Charlton Haws MD Electronically signed by Charlton Haws MD Signature Date/Time: 05/05/2023/1:27:45 PM    Final    CT ABDOMEN PELVIS W CONTRAST  Result Date: 05/04/2023 CLINICAL DATA:  Abdominal pain and distention, cirrhosis of liver, shortness of breath EXAM: CT ABDOMEN AND PELVIS WITH CONTRAST TECHNIQUE: Multidetector CT imaging of the abdomen and pelvis was performed using the standard protocol following bolus administration of intravenous contrast. RADIATION DOSE REDUCTION: This exam was performed according to the departmental dose-optimization program which includes automated exposure control, adjustment of the mA and/or kV according to patient size and/or use of iterative reconstruction technique. CONTRAST:  75mL OMNIPAQUE IOHEXOL 350 MG/ML SOLN COMPARISON:  12/27/2022 FINDINGS: Lower chest: Minimal bilateral pleural effusions are seen. Hepatobiliary: There is minimal nodularity in liver surface. Gallbladder is not distended. There is no wall thickening in gallbladder. Small amount of fluid is noted adjacent to the  gallbladder, possibly part small perihepatic ascites. Pancreas: No focal abnormalities are seen. Spleen: Spleen measures 18.1 cm in AP diameter. There is trace amount of ascites adjacent to the spleen. Adrenals/Urinary Tract: Adrenals are unremarkable. There is no hydronephrosis in the left kidney. There is slight prominence of collecting system in the right kidney. There are no demonstrable renal or ureteral stones. Urinary bladder is unremarkable. Stomach/Bowel: Stomach is not distended. Small bowel loops are not dilated. Appendix is not seen. There is no pericecal inflammation. There is no significant wall thickening in colon. There is no pericolic stranding. Vascular/Lymphatic: Arterial calcifications are seen in aorta and its major branches. In the previous study, portal venous thrombosis was seen in SMV. There is questionable minimal residual thrombus in the SMV. There are no filling defects in portal venous branches. There is ectasia of vessels along the greater curvature and the lesser curvature aspect of stomach suggesting possible portal hypertension. Reproductive: Uterus is not seen. Other: There is minimal ascites. There is no pneumoperitoneum. Umbilical hernia containing fat is seen. Right inguinal hernia containing fat is seen. Musculoskeletal: There is minimal anterolisthesis at the L4-L5 level. There is minimal retrolisthesis at L1-L2 and L2-L3 levels. Degenerative changes are noted with facet hypertrophy, more so in the lower lumbar spine. Spinal stenosis and encroachment of neural foramina is seen at multiple levels. IMPRESSION: There is no evidence of intestinal obstruction or pneumoperitoneum. There is slight prominence of the pelvocaliceal system in the right kidney which may be residual from previous obstruction. There are no demonstrable renal or ureteral stones. There is minimal nodularity in liver suggesting possible cirrhosis. Enlarged spleen. Small ascites. Portal hypertension. Minimal  bilateral pleural effusions. There is possible minimal filling defect in superior mesenteric vein which may suggest minimal residual chronic mesenteric vein thrombosis. Aortic arteriosclerosis.  Lumbar spondylosis. Other findings as described in the body of the report. Electronically Signed  By: Ernie Avena M.D.   On: 05/04/2023 18:28   CT Angio Chest PE W and/or Wo Contrast  Result Date: 05/04/2023 CLINICAL DATA:  Shortness of breath, clinical suspicion for PE EXAM: CT ANGIOGRAPHY CHEST WITH CONTRAST TECHNIQUE: Multidetector CT imaging of the chest was performed using the standard protocol during bolus administration of intravenous contrast. Multiplanar CT image reconstructions and MIPs were obtained to evaluate the vascular anatomy. RADIATION DOSE REDUCTION: This exam was performed according to the departmental dose-optimization program which includes automated exposure control, adjustment of the mA and/or kV according to patient size and/or use of iterative reconstruction technique. CONTRAST:  75mL OMNIPAQUE IOHEXOL 350 MG/ML SOLN COMPARISON:  Chest radiograph done today FINDINGS: Cardiovascular: There is homogeneous enhancement in thoracic aorta. There are no intraluminal filling defects in central pulmonary artery and branches. Evaluation of small peripheral branches is less than optimal. Scattered calcifications are seen in thoracic aorta. There is dense calcification in mitral annulus. Small scattered coronary artery calcifications are seen. Mediastinum/Nodes: No significant lymphadenopathy is seen. Lungs/Pleura: There is no focal pulmonary consolidation. Increased interstitial markings are seen in the posterior aspects of both lower lung fields. Minimal bilateral pleural effusions are seen. There is no pneumothorax. Upper Abdomen: Small ascites is present in perihepatic region. AP diameter of spleen measures 17.6 cm. Musculoskeletal: Degenerative changes are noted in cervical and thoracic spine.  There is encroachment of neural foramina by bony spurs in lower cervical spine. Review of the MIP images confirms the above findings. IMPRESSION: There is no evidence of pulmonary artery embolism. There is no evidence of thoracic aortic dissection. There is no focal pulmonary consolidation. Minimal bilateral pleural effusions. There is slight increase in interstitial markings in the posterior lower lung fields suggesting possible scarring or subsegmental atelectasis. Small ascites. Splenomegaly. Aortic arteriosclerosis.  Coronary artery calcifications are seen. Electronically Signed   By: Ernie Avena M.D.   On: 05/04/2023 18:11   DG Chest Port 1 View  Result Date: 05/04/2023 CLINICAL DATA:  Shortness of breath EXAM: PORTABLE CHEST 1 VIEW COMPARISON:  03/01/2023 FINDINGS: Transverse diameter of heart is increased. Tip of right subclavian chest port is seen in superior vena cava. There are no signs of pulmonary edema or focal pulmonary consolidation. There is no pleural effusion or pneumothorax. Degenerative changes are noted in both shoulders. IMPRESSION: There are no signs of pulmonary edema or focal pulmonary consolidation. Electronically Signed   By: Ernie Avena M.D.   On: 05/04/2023 14:49    Catarina Hartshorn, DO  [facility]  If 7PM-7AM, please contact night-coverage [email or web address] Password [email or web address] 05/06/2023, 8:24 AM   LOS: 2 days

## 2023-05-06 NOTE — Transfer of Care (Signed)
Immediate Anesthesia Transfer of Care Note  Patient: Cristina Santos  Procedure(s) Performed: ESOPHAGOGASTRODUODENOSCOPY (EGD) WITH PROPOFOL COLONOSCOPY WITH PROPOFOL BIOPSY POLYPECTOMY HOT HEMOSTASIS (ARGON PLASMA COAGULATION/BICAP)  Patient Location: PACU  Anesthesia Type:General  Level of Consciousness: awake  Airway & Oxygen Therapy: Patient Spontanous Breathing and Patient connected to nasal cannula oxygen  Post-op Assessment: Report given to RN and Post -op Vital signs reviewed and stable  Post vital signs: Reviewed and stable  Last Vitals:  Vitals Value Taken Time  BP 113/40 05/06/23 1211  Temp    Pulse 48 05/06/23 1212  Resp 17 05/06/23 1212  SpO2 100 % 05/06/23 1212  Vitals shown include unvalidated device data.  Last Pain:  Vitals:   05/06/23 1127  TempSrc:   PainSc: 0-No pain      Patients Stated Pain Goal: 5 (05/06/23 1017)  Complications: No notable events documented.

## 2023-05-06 NOTE — Interval H&P Note (Signed)
History and Physical Interval Note:  05/06/2023 10:52 AM  Cristina Santos  has presented today for surgery, with the diagnosis of Acute blood loss anemia, gastrointestinal bleed.  The various methods of treatment have been discussed with the patient and family. After consideration of risks, benefits and other options for treatment, the patient has consented to  Procedure(s): ESOPHAGOGASTRODUODENOSCOPY (EGD) WITH PROPOFOL (N/A) COLONOSCOPY WITH PROPOFOL (N/A) as a surgical intervention.  The patient's history has been reviewed, patient examined, no change in status, stable for surgery.  I have reviewed the patient's chart and labs.  Questions were answered to the patient's satisfaction.     Lanelle Bal

## 2023-05-06 NOTE — Progress Notes (Signed)
Port hep locked and deaccessed.  No bleeding.  Bandaid placed.  DC instructions reviewed and meds sent to [facility] in [place].  Transported by WC to main entrance and daughter to drive home

## 2023-05-06 NOTE — Consult Note (Addendum)
Cardiology Consultation   Patient ID: ZIKIA LIMING MRN: [id number]; DOB: 08/07/1941  Admit date: 05/04/2023 Date of Consult: 05/06/2023  PCP:  Alinda Deem, MD    [facility] Providers Cardiologist:  Dina Rich, MD        Patient Profile:   Cristina Santos is a 82 y.o. female with a hx  chronic SMV thrombosis,  HTN, diastolic CHF, chest pain who is being seen 05/06/2023 for the evaluation of acute diastolic CHF at the request of Dr. Arbutus Leas.  History of Present Illness:   Cristina Santos has above PMHx saw Dr. Wyline Mood 04/10/23 with chest pain and SOB. Echo LVEF 60-65% grade 2DD. She had mildly elevated BNP and was started on lasix 20 mg daily with plans for Lexiscan once diuresed. She called in 05/04/23 with worsening DOE and edema and advised to go to ED. Also heme positive stools and eliquis on hold. BNP 246. Hbg 8.9 trop negative.There has been a ? Of  PAF/flutter. Her Kardio machine often says it but her daughter is an NP and says there are always P waves. While getting CT her HR jumped up to 130/m and ? Afib but couldn't get a good tracing.   Now back from colonoscopy and endoscopy. No bleeding source found. Feeling better and hoping to go home.    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 10/09/2020   Pancreatic cysts 10/09/2020   Portal hypertension (HCC)-gastropathy and colopathy 10/09/2020   Sleep apnea    Thrombosis, hepatic vein (HCC)    SMV   Thyroid disease    Vestibular migraine     Past Surgical History:  Procedure Laterality Date   ABDOMINAL HYSTERECTOMY  1987   COLON RESECTION     COLONOSCOPY     PORTACATH PLACEMENT     Never removed placed  at time of colon cancer treatment   SHOULDER ARTHROSCOPY     left   TOTAL KNEE ARTHROPLASTY Left 06/08/2019   Procedure: TOTAL KNEE ARTHROPLASTY;  Surgeon: Ollen Gross, MD;  Location: [facility];  Service: Orthopedics;  Laterality: Left;    UPPER GASTROINTESTINAL ENDOSCOPY       Home Medications:  Prior to Admission medications   Medication Sig Start Date End Date Taking? Authorizing Provider  albuterol (VENTOLIN HFA) 108 (90 Base) MCG/ACT inhaler Inhale 1 puff into the lungs as needed.   Yes [provider]  ascorbic acid (VITAMIN C) 500 MG tablet Take 500 mg by mouth daily.   Yes [provider]  Biotin 1000 MCG tablet Take 1,000 mcg by mouth daily.   Yes [provider]  Cholecalciferol 50 MCG (2000 UT) TABS Take 2,000 Units by mouth daily.   Yes [provider]  diphenhydrAMINE-APAP, sleep, (TYLENOL PM EXTRA STRENGTH PO) Take 1 tablet by mouth as needed.   Yes [provider]  furosemide (LASIX) 20 MG tablet Take 1 tablet (20 mg total) by mouth daily. 04/10/23  Yes Branch, Dorothe Pea, MD  lactulose (CHRONULAC) 10 GM/15ML solution Take 10 g by mouth as needed.   Yes [provider]  levothyroxine (SYNTHROID) 75 MCG tablet Take 75 mcg by mouth daily before breakfast.  Yes [provider]  magnesium 30 MG tablet Take 30 mg by mouth at bedtime.   Yes [provider]  Multiple Vitamins-Minerals (PRESERVISION AREDS PO) Take 1 tablet by mouth daily.   Yes [provider]  nadolol (CORGARD) 20 MG tablet Take 1 tablet by mouth once daily Patient taking differently: Take 20 mg by mouth every evening. 04/26/23  Yes Iva Boop, MD  pantoprazole (PROTONIX) 40 MG tablet Take 40 mg by mouth daily.   Yes [provider]  pramipexole (MIRAPEX) 0.75 MG tablet Take 0.75 mg by mouth every evening.   Yes [provider]  triamcinolone cream (KENALOG) 0.1 % Apply topically. 04/09/23  Yes [provider]  vitamin B-12 (CYANOCOBALAMIN) 1000 MCG tablet Take 1,000 mcg by mouth daily.   Yes [provider]  XARELTO 20 MG TABS tablet Take 20 mg by mouth daily.   Yes [provider]  spironolactone (ALDACTONE) 25 MG tablet Take 1 tablet (25 mg total) by mouth daily. Patient not taking: Reported on 05/04/2023 03/01/23   Iva Boop, MD  topiramate (TOPAMAX) 50 MG tablet Take 50 mg by mouth as needed. Patient not taking: Reported on 05/04/2023 05/18/22   [provider]    Inpatient Medications: Scheduled Meds:  Chlorhexidine Gluconate Cloth  6 each Topical Daily   furosemide  40 mg Oral Daily   levothyroxine  75 mcg Oral QAC breakfast   nadolol  20 mg Oral QHS   [START ON 05/08/2023] pantoprazole  40 mg Intravenous Q12H   potassium chloride  20 mEq Oral Once   pramipexole  0.75 mg Oral QPM   Continuous Infusions:  sodium chloride     cefTRIAXone (ROCEPHIN)  IV Stopped (05/05/23 2222)   ferric gluconate (FERRLECIT) IVPB     octreotide (SANDOSTATIN) 500 mcg in sodium chloride 0.9 % 250 mL (2 mcg/mL) infusion 50 mcg/hr (05/06/23 6213)   pantoprazole 8 mg/hr (05/06/23 0323)   PRN Meds: albuterol, diphenhydrAMINE-zinc acetate, lactulose, prochlorperazine  Allergies:    Allergies  Allergen Reactions   Latex Anaphylaxis   Codeine Palpitations    Social History:   Social History   Socioeconomic History   Marital status: Widowed    Spouse name: Not on file   Number of children: 2   Years of education: Not on file   Highest education level: Not on file  Occupational History   Occupation: retired  Tobacco Use   Smoking status: Never   Smokeless tobacco: Never  Vaping Use   Vaping Use: Never used  Substance and Sexual Activity   Alcohol use: No   Drug use: No   Sexual activity: Not Currently  Other Topics Concern   Not on file  Social History Narrative   Married, 1 son one daughter. Daughter is a Publishing rights manager.   She is retired    2 cups coffee per day   Social Determinants of Corporate investment banker Strain: Not on file  Food Insecurity: Not on file  Transportation Needs: Not on file  Physical Activity: Not on file  Stress: Not on file  Social Connections: Not on file  Intimate Partner Violence: Not on file    Family History:     Family History  Problem Relation Age of Onset   Prostate cancer Brother    Lung cancer Brother    Heart disease Brother    Bone cancer Brother    Diabetes Mother    Alzheimer's disease Mother  Diabetes Brother    Heart attack Brother    Lung cancer Brother    Bone cancer Brother    Lung cancer Sister        with metz    Ulcerative colitis Sister    Colon cancer Neg Hx    Esophageal cancer Neg Hx    Stomach cancer Neg Hx    Pancreatic cancer Neg Hx    AAA (abdominal aortic aneurysm) Neg Hx      ROS:  Please see the history of present illness.  Review of Systems  Constitutional: Negative.  HENT: Negative.    Eyes: Negative.   Cardiovascular:  Positive for dyspnea on exertion and leg swelling.  Respiratory: Negative.    Hematologic/Lymphatic: Negative.   Musculoskeletal: Negative.  Negative for joint pain.  Gastrointestinal: Negative.   Genitourinary: Negative.   Neurological: Negative.     All other ROS reviewed and negative.     Physical Exam/Data:   Vitals:   05/05/23 1919 05/06/23 0045 05/06/23 0353 05/06/23 0500  BP: (!) 131/58  (!) 106/46   Pulse: (!) 53 (!) 52 (!) 51   Resp: 18 16 16    Temp: 97.7 F (36.5 C)  98 F (36.7 C)   TempSrc: Oral  Oral   SpO2: 100% 96% 96%   Weight:    88.8 kg  Height:        Intake/Output Summary (Last 24 hours) at 05/06/2023 1006 Last data filed at 05/06/2023 0323 Gross per 24 hour  Intake 1455.78 ml  Output 1500 ml  Net -44.22 ml      05/06/2023    5:00 AM 05/05/2023    5:00 AM 05/04/2023    9:00 PM  Last 3 Weights  Weight (lbs) 195 lb 12.3 oz 195 lb 1.7 oz 197 lb 1.5 oz  Weight (kg) 88.8 kg 88.5 kg  89.4 kg     Body mass index is 32.58 kg/m.  General:  Obese, in no acute distress  HEENT: normal Neck: no JVD Vascular: No carotid bruits; Distal pulses 2+ bilaterally Cardiac:  normal S1, S2; RRR; no murmur   Lungs:  clear to auscultation bilaterally, no wheezing, rhonchi or rales  Abd: soft, nontender, no hepatomegaly  Ext: no edema Musculoskeletal:  No deformities, BUE and BLE strength normal and equal Skin: warm and dry  Neuro:  CNs 2-12 intact, no focal abnormalities noted Psych:  Normal affect   EKG:  The EKG was personally reviewed and demonstrates: sinus brady- NSR with PAC's Telemetry:  Telemetry was personally reviewed and demonstrates:  sinus brady 45-50's  Relevant CV Studies:   Echo 05/05/23 IMPRESSIONS     1. Limited echo no color/doppler of valves done.   2. Left ventricular ejection fraction, by estimation, is 60 to 65%. The  left ventricle has normal function.   3. Left atrial size was mildly dilated.   4. The mitral valve is abnormal. Moderate mitral annular calcification.   5. The aortic valve is tricuspid. There is moderate calcification of the  aortic valve. There is moderate thickening of the aortic valve. Aortic  valve sclerosis/calcification is present, without any evidence of aortic  stenosis.   6. The inferior vena cava is normal in size with <50% respiratory  variability, suggesting right atrial pressure of 8 mmHg.   Laboratory Data:  High Sensitivity Troponin:   Recent Labs  Lab 05/04/23 1336 05/04/23 1521  TROPONINIHS 4 4     Chemistry Recent Labs  Lab 05/04/23 1336 05/05/23 0438 05/06/23  0410  NA 131* 134* 132*  K 3.7 3.8 3.4*  CL 98 99 96*  CO2 25 24 28   GLUCOSE 107* 133* 119*  BUN 14 13 11   CREATININE 0.84 0.86 0.83  CALCIUM 9.1 8.5* 8.2*  MG 2.0 1.8 2.1  GFRNONAA >60 >60 >60  ANIONGAP 8 11 8     Recent Labs  Lab 05/04/23 1336 05/05/23 0438  PROT 6.7 5.8*  ALBUMIN 4.0 3.4*  AST 20 17  ALT 19 15  ALKPHOS 74 57   BILITOT 1.4* 1.3*   Lipids No results for input(s): "CHOL", "TRIG", "HDL", "LABVLDL", "LDLCALC", "CHOLHDL" in the last 168 hours.  Hematology Recent Labs  Lab 05/04/23 1336 05/05/23 0438 05/06/23 0410  WBC 2.9* 2.4* 2.3*  RBC 3.61* 3.27* 3.28*  HGB 8.9* 8.2* 8.1*  HCT 29.2* 26.6* 26.9*  MCV 80.9 81.3 82.0  MCH 24.7* 25.1* 24.7*  MCHC 30.5 30.8 30.1  RDW 15.3 15.2 15.1  PLT 93* 83* 90*   Thyroid  Recent Labs  Lab 05/06/23 0410  TSH 1.282    BNP Recent Labs  Lab 05/04/23 1336  BNP 246.0*    DDimer No results for input(s): "DDIMER" in the last 168 hours.   Radiology/Studies:  ECHOCARDIOGRAM LIMITED  Result Date: 05/05/2023    ECHOCARDIOGRAM LIMITED REPORT   Patient Name:   JANICIA OMAR Date of Exam: 05/05/2023 Medical Rec #:  [id number]    Height:       65.0 in Accession #:    [id number]   Weight:       195.1 lb Date of Birth:  1941/06/12   BSA:          1.957 m Patient Age:    81 years     BP:           129/60 mmHg Patient Gender: F            HR:           51 bpm. [facility] Procedure: Limited Echo Indications:    CHF-Acute Diastolic I50.31  History:        Patient has prior history of Echocardiogram examinations, most                 recent 04/02/2023. Arrythmias:Atrial Flutter. Acute on chronic                 heart failure with preserved ejection fraction (HFpEF) (HCC).                 Obstructive sleep apnea.  Sonographer:    Celesta Gentile RCS Referring Phys: [id number] OLADAPO ADEFESO IMPRESSIONS  1. Limited echo no color/doppler of valves done.  2. Left ventricular ejection fraction, by estimation, is 60 to 65%. The left ventricle has normal function.  3. Left atrial size was mildly dilated.  4. The mitral valve is abnormal. Moderate mitral annular calcification.  5. The aortic valve is tricuspid. There is moderate calcification of the aortic valve. There is moderate thickening of the aortic valve. Aortic valve sclerosis/calcification is present, without any  evidence of aortic stenosis.  6. The inferior vena cava is normal in size with <50% respiratory variability, suggesting right atrial pressure of 8 mmHg. FINDINGS  Left Ventricle: Left ventricular ejection fraction, by estimation, is 60 to 65%. The left ventricle has normal function. The left ventricular internal cavity size was normal in size. There is no left ventricular hypertrophy. Left Atrium: Left atrial size was mildly dilated. Pericardium: There  is no evidence of pericardial effusion. Mitral Valve: The mitral valve is abnormal. There is mild thickening of the mitral valve leaflet(s). There is mild calcification of the mitral valve leaflet(s). Moderate mitral annular calcification. Aortic Valve: The aortic valve is tricuspid. There is moderate calcification of the aortic valve. There is moderate thickening of the aortic valve. Aortic valve sclerosis/calcification is present, without any evidence of aortic stenosis. Venous: The inferior vena cava is normal in size with less than 50% respiratory variability, suggesting right atrial pressure of 8 mmHg. IAS/Shunts: No atrial level shunt detected by color flow Doppler. Additional Comments: Limited echo no color/doppler of valves done.  LEFT VENTRICLE PLAX 2D LVIDd:         4.40 cm LVIDs:         2.30 cm LV PW:         1.10 cm LV IVS:        1.00 cm LVOT diam:     2.00 cm LVOT Area:     3.14 cm  LEFT ATRIUM         Index LA diam:    4.10 cm 2.09 cm/m   AORTA Ao Root diam: 3.60 cm  SHUNTS Systemic Diam: 2.00 cm Charlton Haws MD Electronically signed by Charlton Haws MD Signature Date/Time: 05/05/2023/1:27:45 PM    Final    CT ABDOMEN PELVIS W CONTRAST  Result Date: 05/04/2023 CLINICAL DATA:  Abdominal pain and distention, cirrhosis of liver, shortness of breath EXAM: CT ABDOMEN AND PELVIS WITH CONTRAST TECHNIQUE: Multidetector CT imaging of the abdomen and pelvis was performed using the standard protocol following bolus administration of intravenous contrast.  RADIATION DOSE REDUCTION: This exam was performed according to the departmental dose-optimization program which includes automated exposure control, adjustment of the mA and/or kV according to patient size and/or use of iterative reconstruction technique. CONTRAST:  75mL OMNIPAQUE IOHEXOL 350 MG/ML SOLN COMPARISON:  12/27/2022 FINDINGS: Lower chest: Minimal bilateral pleural effusions are seen. Hepatobiliary: There is minimal nodularity in liver surface. Gallbladder is not distended. There is no wall thickening in gallbladder. Small amount of fluid is noted adjacent to the gallbladder, possibly part small perihepatic ascites. Pancreas: No focal abnormalities are seen. Spleen: Spleen measures 18.1 cm in AP diameter. There is trace amount of ascites adjacent to the spleen. Adrenals/Urinary Tract: Adrenals are unremarkable. There is no hydronephrosis in the left kidney. There is slight prominence of collecting system in the right kidney. There are no demonstrable renal or ureteral stones. Urinary bladder is unremarkable. Stomach/Bowel: Stomach is not distended. Small bowel loops are not dilated. Appendix is not seen. There is no pericecal inflammation. There is no significant wall thickening in colon. There is no pericolic stranding. Vascular/Lymphatic: Arterial calcifications are seen in aorta and its major branches. In the previous study, portal venous thrombosis was seen in SMV. There is questionable minimal residual thrombus in the SMV. There are no filling defects in portal venous branches. There is ectasia of vessels along the greater curvature and the lesser curvature aspect of stomach suggesting possible portal hypertension. Reproductive: Uterus is not seen. Other: There is minimal ascites. There is no pneumoperitoneum. Umbilical hernia containing fat is seen. Right inguinal hernia containing fat is seen. Musculoskeletal: There is minimal anterolisthesis at the L4-L5 level. There is minimal retrolisthesis at  L1-L2 and L2-L3 levels. Degenerative changes are noted with facet hypertrophy, more so in the lower lumbar spine. Spinal stenosis and encroachment of neural foramina is seen at multiple levels. IMPRESSION: There is no evidence  of intestinal obstruction or pneumoperitoneum. There is slight prominence of the pelvocaliceal system in the right kidney which may be residual from previous obstruction. There are no demonstrable renal or ureteral stones. There is minimal nodularity in liver suggesting possible cirrhosis. Enlarged spleen. Small ascites. Portal hypertension. Minimal bilateral pleural effusions. There is possible minimal filling defect in superior mesenteric vein which may suggest minimal residual chronic mesenteric vein thrombosis. Aortic arteriosclerosis.  Lumbar spondylosis. Other findings as described in the body of the report. Electronically Signed   By: Ernie Avena M.D.   On: 05/04/2023 18:28   CT Angio Chest PE W and/or Wo Contrast  Result Date: 05/04/2023 CLINICAL DATA:  Shortness of breath, clinical suspicion for PE EXAM: CT ANGIOGRAPHY CHEST WITH CONTRAST TECHNIQUE: Multidetector CT imaging of the chest was performed using the standard protocol during bolus administration of intravenous contrast. Multiplanar CT image reconstructions and MIPs were obtained to evaluate the vascular anatomy. RADIATION DOSE REDUCTION: This exam was performed according to the departmental dose-optimization program which includes automated exposure control, adjustment of the mA and/or kV according to patient size and/or use of iterative reconstruction technique. CONTRAST:  75mL OMNIPAQUE IOHEXOL 350 MG/ML SOLN COMPARISON:  Chest radiograph done today FINDINGS: Cardiovascular: There is homogeneous enhancement in thoracic aorta. There are no intraluminal filling defects in central pulmonary artery and branches. Evaluation of small peripheral branches is less than optimal. Scattered calcifications are seen in  thoracic aorta. There is dense calcification in mitral annulus. Small scattered coronary artery calcifications are seen. Mediastinum/Nodes: No significant lymphadenopathy is seen. Lungs/Pleura: There is no focal pulmonary consolidation. Increased interstitial markings are seen in the posterior aspects of both lower lung fields. Minimal bilateral pleural effusions are seen. There is no pneumothorax. Upper Abdomen: Small ascites is present in perihepatic region. AP diameter of spleen measures 17.6 cm. Musculoskeletal: Degenerative changes are noted in cervical and thoracic spine. There is encroachment of neural foramina by bony spurs in lower cervical spine. Review of the MIP images confirms the above findings. IMPRESSION: There is no evidence of pulmonary artery embolism. There is no evidence of thoracic aortic dissection. There is no focal pulmonary consolidation. Minimal bilateral pleural effusions. There is slight increase in interstitial markings in the posterior lower lung fields suggesting possible scarring or subsegmental atelectasis. Small ascites. Splenomegaly. Aortic arteriosclerosis.  Coronary artery calcifications are seen. Electronically Signed   By: Ernie Avena M.D.   On: 05/04/2023 18:11   DG Chest Port 1 View  Result Date: 05/04/2023 CLINICAL DATA:  Shortness of breath EXAM: PORTABLE CHEST 1 VIEW COMPARISON:  03/01/2023 FINDINGS: Transverse diameter of heart is increased. Tip of right subclavian chest port is seen in superior vena cava. There are no signs of pulmonary edema or focal pulmonary consolidation. There is no pleural effusion or pneumothorax. Degenerative changes are noted in both shoulders. IMPRESSION: There are no signs of pulmonary edema or focal pulmonary consolidation. Electronically Signed   By: Ernie Avena M.D.   On: 05/04/2023 14:49     Assessment and Plan:   Acute diastolic CHF -Echo 03/2023 normla LVEF 60-65% grade 2 DD no WMAnormal RV, normal PASP, RA  pressure estimated 8 mmHg-repeat echo yest similar. -negative 727 cc after colon prep. -hoping to go home and lasix change to 40 mg po daily -2 gm sodium diet -has f/u with Sharlene Dory NP in [place] 05/22/23 11:00 -no rate lowering meds with SB  Chest pain-plan for OP lexiscan once diuresed  ?PAF on telemetry but only see NSR  with PAC's. Will place monitor as OP.  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  Liver cirrhosis/NASH   For questions or updates, please contact [facility] Please consult [email or web address] for contact info under    Signed, Jacolyn Reedy, PA-C  05/06/2023 10:06 AM  Patient seen and examined   I agree with findngs as noted above by Leda Gauze    Pt is an 82 yo who presented with increased SOB, swelling      Found to be anemic with heme + stool     No source bleeding found The pt hs been diuresed since admit   Breathing is OK  On exam, pt comfortable  Neck:  JVP is normal Lungs are CTA Cardiac exam:   RRR  No S3  No signif murmurs Abd is benign Ext No LE edema  Acute HFpEF   Volume status is improved   Overall OK   Send home on Lasix 40 mg per day     ? PAF   Will set up for a Zio patch  to capture arrhythmas    Chest pressure   Schedule outpt myovew  Doing OK now    Will sign off  Dietrich Pates MD

## 2023-05-06 NOTE — Op Note (Signed)
[facility] Patient Name: Cristina Santos Procedure Date: 05/06/2023 11:41 AM MRN: [id number] Date of Birth: 03/04/1941 Attending MD: Hennie Duos. Marletta Lor , [place], [id number] CSN: [id number] Age: 82 Admit Type: Inpatient Procedure:                Colonoscopy Indications:              Iron deficiency anemia secondary to chronic blood                            loss Providers:                Hennie Duos. Marletta Lor, DO, Sheran Fava, Angela A.                            Museum/gallery exhibitions officer, Charity fundraiser, Judeth Cornfield. Jessee Avers, Technician Referring MD:              Medicines:                See the Anesthesia note for documentation of the                            administered medications Complications:            No immediate complications. Estimated Blood Loss:     Estimated blood loss was minimal. Procedure:                Pre-Anesthesia Assessment:                           - The anesthesia plan was to use monitored                            anesthesia care (MAC).                           After obtaining informed consent, the colonoscope                            was passed under direct vision. Throughout the                            procedure, the patient's blood pressure, pulse, and                            oxygen saturations were monitored continuously. The                            [id number]-HQ190L ([id number]) scope was introduced through                            the anus and advanced to the the terminal ileum,                            with identification of the appendiceal orifice and                            IC valve.  The colonoscopy was performed without                            difficulty. The patient tolerated the procedure                            well. The quality of the bowel preparation was                            evaluated using the BBPS [facility] Bowel Preparation                            Scale) with scores of: Right Colon = 3, Transverse                            Colon = 3  and Left Colon = 3 (entire mucosa seen                            well with no residual staining, small fragments of                            stool or opaque liquid). The total BBPS score                            equals 9. Scope In: 11:42:29 AM Scope Out: 12:05:55 PM Scope Withdrawal Time: 0 hours 18 minutes 57 seconds  Total Procedure Duration: 0 hours 23 minutes 26 seconds  Findings:      Small, non-bleeding rectal varices were found.      A few small-mouthed diverticula were found in the sigmoid colon.      The terminal ileum appeared normal.      There is no endoscopic evidence of bleeding in the entire colon.      Two sessile polyps were found in the transverse colon and ascending       colon. The polyps were 3 to 5 mm in size. These polyps were removed with       a cold snare. Resection and retrieval were complete.      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 All       lesions successfully sealed with argon plasma coagulation Impression:               - Rectal varices.                           - Diverticulosis in the sigmoid colon.                           - The examined portion of the ileum was normal.                           - Two 3 to 5 mm polyps in the transverse colon and  in the ascending colon, removed with a cold snare.                            Resected and retrieved.                           - 9 non-bleeding colonic angiodysplastic lesions.                            Treated with argon plasma coagulation (APC). Moderate Sedation:      Per Anesthesia Care Recommendation:           - Return patient to hospital ward for ongoing care.                           - Resume regular diet.                           - Okay to DC octreotide. Transition to oral PPI BID.                           - I will follow up on pathology results.                            - Agree with iron supplementation.                           - Hold Xarelto x2 more days                           - Follow up with primary GI for cirrhosis mgmt.                            Continue Nadolol for primary ppx esophageal varices. Procedure Code(s):        --- Professional ---                           [phone], Colonoscopy, flexible; with removal of                            tumor(s), polyp(s), or other lesion(s) by snare                            technique Diagnosis Code(s):        --- Professional ---                           D12.3, Benign neoplasm of transverse colon (hepatic                            flexure or splenic flexure)                           D12.2, Benign neoplasm of ascending colon  K55.20, Angiodysplasia of colon without hemorrhage                           K64.8, Other hemorrhoids                           D50.0, Iron deficiency anemia secondary to blood                            loss (chronic)                           K57.30, Diverticulosis of large intestine without                            perforation or abscess without bleeding CPT copyright 2022 [employer]. All rights reserved. The codes documented in this report are preliminary and upon coder review may  be revised to meet current compliance requirements. Hennie Duos. Marletta Lor, DO Hennie Duos. Marletta Lor, DO 05/06/2023 12:14:06 PM This report has been signed electronically. Number of Addenda: 0

## 2023-05-06 NOTE — Progress Notes (Signed)
Patient has remained NPO since 0000 6/17.

## 2023-05-06 NOTE — Telephone Encounter (Signed)
Message sent to pt via mychart

## 2023-05-06 NOTE — Anesthesia Procedure Notes (Signed)
Date/Time: 05/06/2023 11:27 AM  Performed by: Franco Nones, CRNAPre-anesthesia Checklist: Patient identified, Emergency Drugs available, Suction available, Timeout performed and Patient being monitored Patient Re-evaluated:Patient Re-evaluated prior to induction Oxygen Delivery Method: Nasal Cannula

## 2023-05-06 NOTE — Telephone Encounter (Signed)
Referral sent 

## 2023-05-06 NOTE — Progress Notes (Signed)
Patient finished bowel prep, no complaints of pain, no overnight events.

## 2023-05-06 NOTE — Op Note (Signed)
[facility] Patient Name: Cristina Santos Procedure Date: 05/06/2023 11:18 AM MRN: [id number] Date of Birth: Nov 24, 1940 Attending MD: Hennie Duos. Marletta Lor , [place], [id number] CSN: [id number] Age: 82 Admit Type: Inpatient Procedure:                Upper GI endoscopy Indications:              Iron deficiency anemia secondary to chronic blood                            loss Providers:                Hennie Duos. Marletta Lor, DO, Sheran Fava, Angela A.                            Museum/gallery exhibitions officer, Charity fundraiser, Judeth Cornfield. Jessee Avers, Technician Referring MD:              Medicines:                See the Anesthesia note for documentation of the                            administered medications Complications:            No immediate complications. Estimated Blood Loss:     Estimated blood loss was minimal. Procedure:                Pre-Anesthesia Assessment:                           - The anesthesia plan was to use monitored                            anesthesia care (MAC).                           After obtaining informed consent, the endoscope was                            passed under direct vision. Throughout the                            procedure, the patient's blood pressure, pulse, and                            oxygen saturations were monitored continuously. The                            [id number]-[id number] ([id number]) scope was introduced through the                            mouth, and advanced to the second part of duodenum.                            The upper GI endoscopy was accomplished without  difficulty. The patient tolerated the procedure                            well. Scope In: 11:33:42 AM Scope Out: 11:38:47 AM Total Procedure Duration: 0 hours 5 minutes 5 seconds  Findings:      Grade I varices were found in the lower third of the esophagus.      The Z-line was regular and was found 38 cm from the incisors.      Mild portal hypertensive gastropathy was found in  the gastric body.      Patchy moderate inflammation characterized by erythema was found in the       gastric body. Biopsies were taken with a cold forceps for Helicobacter       pylori testing.      Multiple small fundic gland polyps with no bleeding and no stigmata of       recent bleeding were found in the gastric fundus and in the gastric body.      The duodenal bulb, first portion of the duodenum and second portion of       the duodenum were normal. Impression:               - Grade I esophageal varices.                           - Z-line regular, 38 cm from the incisors.                           - Portal hypertensive gastropathy.                           - Gastritis. Biopsied.                           - Multiple gastric polyps.                           - Normal duodenal bulb, first portion of the                            duodenum and second portion of the duodenum. Moderate Sedation:      Per Anesthesia Care Recommendation:           - Proceed with colonoscopy. See colonoscopy report                            for further recommendations Procedure Code(s):        --- Professional ---                           [phone], Esophagogastroduodenoscopy, flexible,                            transoral; with biopsy, single or multiple Diagnosis Code(s):        --- Professional ---                           I85.00, Esophageal varices without bleeding  K76.6, Portal hypertension                           K31.89, Other diseases of stomach and duodenum                           K29.70, Gastritis, unspecified, without bleeding                           K31.7, Polyp of stomach and duodenum                           D50.0, Iron deficiency anemia secondary to blood                            loss (chronic) CPT copyright 2022 [employer] Association. All rights reserved. The codes documented in this report are preliminary and upon coder review may  be revised to  meet current compliance requirements. Hennie Duos. Marletta Lor, DO Hennie Duos. Marletta Lor, DO 05/06/2023 11:41:46 AM This report has been signed electronically. Number of Addenda: 0

## 2023-05-06 NOTE — Discharge Summary (Signed)
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  The patient has also had some orthopnea type symptoms.  She also has noted increasing abdominal girth and lower extremity edema over the past 2 months.  Notably, the patient saw Dr. Wyline Mood in the office on 04/10/2023 for dyspnea.  She was started on furosemide 20 mg daily.  The patient endorses at least a 5 pound weight gain at home. She has not noticed any hematochezia or melena.  She denies any hematemesis or hematuria.  She has not any fevers, chills, chest pain, nausea, vomiting, diarrhea.  Notably, the patient had not EGD done on 09/02/2020 which showed grade 1 esophageal varices in the lower third esophagus, gastric polyps, and gastritis.  Colonoscopy on 09/02/2020 nonbleeding internal hemorrhoids, nonbleeding colonic angioectasias, and rectal varices.  There were also noted colon polyps.  In the ED, the patient was afebrile hemodynamically stable with oxygen saturation 97% on room air.  WBC 2.9, hemoglobin 8.9, platelets 93,000.  Sodium 131, potassium 3.7, bicarbonate 25, serum creatinine 0.84.  BNP 246.  AST 17, ALT 15, alkaline phosphatase 57, total bilirubin 1.3.  Troponin 4>> 4.  Hemoccult was positive. The patient was started on octreotide drip and pantoprazole drip.  CTA chest was negative for PE, negative dissection.  There is minimal  bilateral pleural effusions.  There is small amount perihepatic ascites.  There is increased interstitial markings in the bilateral lower lobes in the posterior lung fields.  CT abdomen and pelvis with negative for obstruction.  This shows slight prominence of the pelvocaliectasis in the right kidney.  There was no obvious ureteral obstruction or stones.  There is liver nodularity.  There is splenomegaly and a small amount of ascites.  There is minimal residual chronic SMV thrombosis.  GI was consulted to assist with management.  Assessment and Plan: Acute on chronic HFpEF -Continued IV furosemide 40 mg IV twice daily -Personally reviewed chest x-ray--increased interstitial markings -04/02/2023 echo--EF 60-65%, grade 2 DD, no WMA, normal RVEF, trivial MR -05/05/23 echo EF 60-65% -05/05/2023 standing weight 194.1 pounds -05/06/2023 standing weight 194.4 -ReDS vest = 24 -transition to po lasix  40 mg po daily   Paroxysmal atrial fibrillation/atrial flutter -Personally reviewed telemetry--currently in sinus rhythm with PACs throughout hospitalization -personally reviewed EKG on 6/15--atrial flutter -Holding rivaroxaban in the setting of blood loss anemia -Continue home dose nadolol -Check TSH-1.282 -cardiology consulted>>placed ZIO patch at time of d/c   Symptomatic anemia/heme positive stool -Octreotide and pantoprazole drips started per GI -GI consult appreciated -Continue ceftriaxone for SBP prophylaxis -baseline Hgb 10-11 -presented with Hgb 8.9>>8.2>>8.1 -05/06/23 colonoscopy--9 nonbleeding colon angioectasias tx'ed with APC; rectal varices; transverse and ascending colon polyps -05/06/23 EGD--grade 1 esophageal varices; portal HTN gastropathy, +gastric polyp -05/06/23--clear by GI for d/c with ferrous sulfate bid   Supratherapeutic INR -Hard to interpret in the setting of rivaroxaban which will also elevate INR -Holding rivaroxaban to allow washout and recheck INR  after washout -05/06/23  INR 1.4   Thrombocytopenia -Appears to be chronic dating back to July 2020 -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 -Check B12 --1224 -folate 9.8 -Check TSH--1.282   Hyponatremia -due to liver cirrhosis and fluid overload -chronic 131-134 dating back to July 2020   Chronic SMV thrombosis -Holding rivaroxaban secondary to drop in hemoglobin -last dose Xarelto 6/15 10AM   NASH liver cirrhosis with decompensation (fluid overload) -Continue pantoprazole and octreotide per GI -Continue nadolol -Continue lactulose   Hypothyroidism -Continue levothyroxine   Restless leg syndrome/Iron deficiency -iron saturation 7, ferritin 7 -Continue pramipexole -given nulecit x 1   Hyperglycemia/Impaired glucose tolerance -Check hemoglobin A1c--6.3 -outpt follow up with PCP   Obesity -BMI 32.47 -lifestyle modification       Consultants: GI, cardiology Procedures performed: EGD/colonoscopy as above  Disposition: Home Diet recommendation:  Carb modified diet DISCHARGE MEDICATION: Allergies as of 05/06/2023       Reactions   Latex Anaphylaxis   Codeine Palpitations        Medication List     STOP taking these medications    spironolactone 25 MG tablet Commonly known as: ALDACTONE   topiramate 50 MG tablet Commonly known as: TOPAMAX       TAKE these medications    albuterol 108 (90 Base) MCG/ACT inhaler Commonly known as: VENTOLIN HFA Inhale 1 puff into the lungs as needed.   ascorbic acid 500 MG tablet Commonly known as: VITAMIN C Take 500 mg by mouth daily.   Biotin 1000 MCG tablet Take 1,000 mcg by mouth daily.   Cholecalciferol 50 MCG (2000 UT) Tabs Take 2,000 Units by mouth daily.   cyanocobalamin 1000 MCG tablet Commonly known as: VITAMIN B12 Take 1,000 mcg by mouth daily.   ferrous sulfate 325 (65 FE) MG tablet Take 1 tablet (325 mg total) by mouth 2 (two) times daily with a meal. Start taking on: May 07, 2023   furosemide 40 MG tablet Commonly known  as: LASIX Take 1 tablet (40 mg total) by mouth daily. Start taking on: May 07, 2023 What changed:  medication strength how much to take   lactulose 10 GM/15ML solution Commonly known as: CHRONULAC Take 10 g by mouth as needed.   levothyroxine 75 MCG tablet Commonly known as: SYNTHROID Take 75 mcg by mouth daily before breakfast.   magnesium 30 MG tablet Take 30 mg by mouth at bedtime.   nadolol 20 MG tablet Commonly known as: CORGARD Take 1 tablet by mouth once daily What changed: when to take this   pantoprazole 40 MG tablet Commonly known as: PROTONIX Take 1 tablet (40 mg total) by mouth 2 (two) times daily before a meal. What changed: when to take this   pramipexole 0.75 MG tablet Commonly known as: MIRAPEX Take 0.75 mg by mouth every evening.   PRESERVISION AREDS PO Take 1 tablet by mouth daily.   triamcinolone cream 0.1 % Commonly known as: KENALOG Apply topically.   TYLENOL PM EXTRA STRENGTH PO Take 1 tablet by mouth as needed.   Xarelto 20 MG Tabs tablet Generic drug: rivaroxaban Take 1 tablet (20 mg total) by mouth daily. Restart on 05/09/23 Start taking on: May 09, 2023 What changed:  additional instructions These instructions start on May 09, 2023. If you are unsure what to do until then, ask your doctor or other care provider.        Discharge Exam: Filed Weights   05/04/23 2100 05/05/23 0500 05/06/23 0500  Weight: 89.4 kg 88.5 kg  88.8 kg   HEENT:  [place]/AT, No thrush, no icterus CV:  RRR, no rub, no S3, no S4 Lung:  CTA, no wheeze, no rhonchi Abd:  soft/+BS, NT Ext:  No edema, no lymphangitis, no synovitis, no rash   Condition at discharge: stable  The results of significant diagnostics from this hospitalization (including imaging, microbiology, ancillary and laboratory) are listed below for reference.   Imaging Studies: ECHOCARDIOGRAM LIMITED  Result Date: 05/05/2023    ECHOCARDIOGRAM LIMITED REPORT   Patient Name:   PAULLA HERZBERG  Date of Exam: 05/05/2023 Medical Rec #:  [id number]    Height:       65.0 in Accession #:    [id number]   Weight:       195.1 lb Date of Birth:  Apr 23, 1941   BSA:          1.957 m Patient Age:    81 years     BP:           129/60 mmHg Patient Gender: F            HR:           51 bpm. [facility] Procedure: Limited Echo Indications:    CHF-Acute Diastolic I50.31  History:        Patient has prior history of Echocardiogram examinations, most                 recent 04/02/2023. Arrythmias:Atrial Flutter. Acute on chronic                 heart failure with preserved ejection fraction (HFpEF) (HCC).                 Obstructive sleep apnea.  Sonographer:    Celesta Gentile RCS Referring Phys: [id number] OLADAPO ADEFESO IMPRESSIONS  1. Limited echo no color/doppler of valves done.  2. Left ventricular ejection fraction, by estimation, is 60 to 65%. The left ventricle has normal function.  3. Left atrial size was mildly dilated.  4. The mitral valve is abnormal. Moderate mitral annular calcification.  5. The aortic valve is tricuspid. There is moderate calcification of the aortic valve. There is moderate thickening of the aortic valve. Aortic valve sclerosis/calcification is present, without any evidence of aortic stenosis.  6. The inferior vena cava is normal in size with <50% respiratory variability, suggesting right atrial pressure of 8 mmHg. FINDINGS  Left Ventricle: Left ventricular ejection fraction, by estimation, is 60 to 65%. The left ventricle has normal function. The left ventricular internal cavity size was normal in size. There is no left ventricular hypertrophy. Left Atrium: Left atrial size was mildly dilated. Pericardium: There is no evidence of pericardial effusion. Mitral Valve: The mitral valve is abnormal. There is mild thickening of the mitral valve leaflet(s). There is mild calcification of the mitral valve leaflet(s). Moderate mitral annular calcification. Aortic Valve: The aortic valve is  tricuspid. There is moderate calcification of the aortic valve. There is moderate thickening of the aortic valve. Aortic valve sclerosis/calcification is present, without any evidence of aortic stenosis. Venous: The inferior vena cava is normal in size with less than 50% respiratory variability, suggesting right atrial pressure of 8 mmHg. IAS/Shunts: No atrial level shunt detected by color flow Doppler. Additional Comments: Limited echo no color/doppler of valves done.  LEFT VENTRICLE PLAX 2D LVIDd:         4.40 cm LVIDs:         2.30 cm LV PW:  1.10 cm LV IVS:        1.00 cm LVOT diam:     2.00 cm LVOT Area:     3.14 cm  LEFT ATRIUM         Index LA diam:    4.10 cm 2.09 cm/m   AORTA Ao Root diam: 3.60 cm  SHUNTS Systemic Diam: 2.00 cm Charlton Haws MD Electronically signed by Charlton Haws MD Signature Date/Time: 05/05/2023/1:27:45 PM    Final    CT ABDOMEN PELVIS W CONTRAST  Result Date: 05/04/2023 CLINICAL DATA:  Abdominal pain and distention, cirrhosis of liver, shortness of breath EXAM: CT ABDOMEN AND PELVIS WITH CONTRAST TECHNIQUE: Multidetector CT imaging of the abdomen and pelvis was performed using the standard protocol following bolus administration of intravenous contrast. RADIATION DOSE REDUCTION: This exam was performed according to the departmental dose-optimization program which includes automated exposure control, adjustment of the mA and/or kV according to patient size and/or use of iterative reconstruction technique. CONTRAST:  75mL OMNIPAQUE IOHEXOL 350 MG/ML SOLN COMPARISON:  12/27/2022 FINDINGS: Lower chest: Minimal bilateral pleural effusions are seen. Hepatobiliary: There is minimal nodularity in liver surface. Gallbladder is not distended. There is no wall thickening in gallbladder. Small amount of fluid is noted adjacent to the gallbladder, possibly part small perihepatic ascites. Pancreas: No focal abnormalities are seen. Spleen: Spleen measures 18.1 cm in AP diameter. There is  trace amount of ascites adjacent to the spleen. Adrenals/Urinary Tract: Adrenals are unremarkable. There is no hydronephrosis in the left kidney. There is slight prominence of collecting system in the right kidney. There are no demonstrable renal or ureteral stones. Urinary bladder is unremarkable. Stomach/Bowel: Stomach is not distended. Small bowel loops are not dilated. Appendix is not seen. There is no pericecal inflammation. There is no significant wall thickening in colon. There is no pericolic stranding. Vascular/Lymphatic: Arterial calcifications are seen in aorta and its major branches. In the previous study, portal venous thrombosis was seen in SMV. There is questionable minimal residual thrombus in the SMV. There are no filling defects in portal venous branches. There is ectasia of vessels along the greater curvature and the lesser curvature aspect of stomach suggesting possible portal hypertension. Reproductive: Uterus is not seen. Other: There is minimal ascites. There is no pneumoperitoneum. Umbilical hernia containing fat is seen. Right inguinal hernia containing fat is seen. Musculoskeletal: There is minimal anterolisthesis at the L4-L5 level. There is minimal retrolisthesis at L1-L2 and L2-L3 levels. Degenerative changes are noted with facet hypertrophy, more so in the lower lumbar spine. Spinal stenosis and encroachment of neural foramina is seen at multiple levels. IMPRESSION: There is no evidence of intestinal obstruction or pneumoperitoneum. There is slight prominence of the pelvocaliceal system in the right kidney which may be residual from previous obstruction. There are no demonstrable renal or ureteral stones. There is minimal nodularity in liver suggesting possible cirrhosis. Enlarged spleen. Small ascites. Portal hypertension. Minimal bilateral pleural effusions. There is possible minimal filling defect in superior mesenteric vein which may suggest minimal residual chronic mesenteric vein  thrombosis. Aortic arteriosclerosis.  Lumbar spondylosis. Other findings as described in the body of the report. Electronically Signed   By: Ernie Avena M.D.   On: 05/04/2023 18:28   CT Angio Chest PE W and/or Wo Contrast  Result Date: 05/04/2023 CLINICAL DATA:  Shortness of breath, clinical suspicion for PE EXAM: CT ANGIOGRAPHY CHEST WITH CONTRAST TECHNIQUE: Multidetector CT imaging of the chest was performed using the standard protocol during bolus administration of intravenous  contrast. Multiplanar CT image reconstructions and MIPs were obtained to evaluate the vascular anatomy. RADIATION DOSE REDUCTION: This exam was performed according to the departmental dose-optimization program which includes automated exposure control, adjustment of the mA and/or kV according to patient size and/or use of iterative reconstruction technique. CONTRAST:  75mL OMNIPAQUE IOHEXOL 350 MG/ML SOLN COMPARISON:  Chest radiograph done today FINDINGS: Cardiovascular: There is homogeneous enhancement in thoracic aorta. There are no intraluminal filling defects in central pulmonary artery and branches. Evaluation of small peripheral branches is less than optimal. Scattered calcifications are seen in thoracic aorta. There is dense calcification in mitral annulus. Small scattered coronary artery calcifications are seen. Mediastinum/Nodes: No significant lymphadenopathy is seen. Lungs/Pleura: There is no focal pulmonary consolidation. Increased interstitial markings are seen in the posterior aspects of both lower lung fields. Minimal bilateral pleural effusions are seen. There is no pneumothorax. Upper Abdomen: Small ascites is present in perihepatic region. AP diameter of spleen measures 17.6 cm. Musculoskeletal: Degenerative changes are noted in cervical and thoracic spine. There is encroachment of neural foramina by bony spurs in lower cervical spine. Review of the MIP images confirms the above findings. IMPRESSION: There is  no evidence of pulmonary artery embolism. There is no evidence of thoracic aortic dissection. There is no focal pulmonary consolidation. Minimal bilateral pleural effusions. There is slight increase in interstitial markings in the posterior lower lung fields suggesting possible scarring or subsegmental atelectasis. Small ascites. Splenomegaly. Aortic arteriosclerosis.  Coronary artery calcifications are seen. Electronically Signed   By: Ernie Avena M.D.   On: 05/04/2023 18:11   DG Chest Port 1 View  Result Date: 05/04/2023 CLINICAL DATA:  Shortness of breath EXAM: PORTABLE CHEST 1 VIEW COMPARISON:  03/01/2023 FINDINGS: Transverse diameter of heart is increased. Tip of right subclavian chest port is seen in superior vena cava. There are no signs of pulmonary edema or focal pulmonary consolidation. There is no pleural effusion or pneumothorax. Degenerative changes are noted in both shoulders. IMPRESSION: There are no signs of pulmonary edema or focal pulmonary consolidation. Electronically Signed   By: Ernie Avena M.D.   On: 05/04/2023 14:49    Microbiology: Results for orders placed or performed during the hospital encounter of 06/04/19  SARS Coronavirus 2 (Performed in [facility] hospital lab)     Status: None   Collection Time: 06/04/19 12:57 PM   Specimen: Nasal Swab  Result Value Ref Range Status   SARS Coronavirus 2 NEGATIVE NEGATIVE Final    Comment: (NOTE) SARS-CoV-2 target nucleic acids are NOT DETECTED. The SARS-CoV-2 RNA is generally detectable in upper and lower respiratory specimens during the acute phase of infection. Negative results do not preclude SARS-CoV-2 infection, do not rule out co-infections with other pathogens, and should not be used as the sole basis for treatment or other patient management decisions. Negative results must be combined with clinical observations, patient history, and epidemiological information. The expected result is Negative. Fact  Sheet for Patients: HairSlick.no Fact Sheet for Healthcare Providers: quierodirigir.com This test is not yet approved or cleared by the [place] FDA and  has been authorized for detection and/or diagnosis of SARS-CoV-2 by FDA under an Emergency Use Authorization (EUA). This EUA will remain  in effect (meaning this test can be used) for the duration of the COVID-19 declaration under Section 56 4(b)(1) of the Act, 21 U.S.C. section 360bbb-3(b)(1), unless the authorization is terminated or revoked sooner. Performed at [facility], [address] [place], [place] [address]  Labs: CBC: Recent Labs  Lab 05/04/23 1336 05/05/23 0438 05/06/23 0410  WBC 2.9* 2.4* 2.3*  NEUTROABS 2.0  --   --   HGB 8.9* 8.2* 8.1*  HCT 29.2* 26.6* 26.9*  MCV 80.9 81.3 82.0  PLT 93* 83* 90*   Basic Metabolic Panel: Recent Labs  Lab 05/04/23 1336 05/05/23 0438 05/06/23 0410  NA 131* 134* 132*  K 3.7 3.8 3.4*  CL 98 99 96*  CO2 25 24 28   GLUCOSE 107* 133* 119*  BUN 14 13 11   CREATININE 0.84 0.86 0.83  CALCIUM 9.1 8.5* 8.2*  MG 2.0 1.8 2.1  PHOS  --  4.3  --    Liver Function Tests: Recent Labs  Lab 05/04/23 1336 05/05/23 0438  AST 20 17  ALT 19 15  ALKPHOS 74 57  BILITOT 1.4* 1.3*  PROT 6.7 5.8*  ALBUMIN 4.0 3.4*   CBG: No results for input(s): "GLUCAP" in the last 168 hours.  Discharge time spent: greater than 30 minutes.  Signed: Catarina Hartshorn, MD [facility] 05/06/2023

## 2023-05-06 NOTE — Anesthesia Postprocedure Evaluation (Signed)
Anesthesia Post Note  Patient: Cristina Santos  Procedure(s) Performed: ESOPHAGOGASTRODUODENOSCOPY (EGD) WITH PROPOFOL COLONOSCOPY WITH PROPOFOL BIOPSY POLYPECTOMY HOT HEMOSTASIS (ARGON PLASMA COAGULATION/BICAP)  Patient location during evaluation: PACU Anesthesia Type: General Level of consciousness: awake and alert and oriented Pain management: pain level controlled Vital Signs Assessment: post-procedure vital signs reviewed and stable Respiratory status: spontaneous breathing, nonlabored ventilation and respiratory function stable Cardiovascular status: blood pressure returned to baseline and stable Postop Assessment: no apparent nausea or vomiting Anesthetic complications: no  No notable events documented.   Last Vitals:  Vitals:   05/06/23 1215 05/06/23 1230  BP: (!) 109/53   Pulse: (!) 50 (!) 157  Resp: 16 16  Temp:    SpO2: 100% 92%    Last Pain:  Vitals:   05/06/23 1210  TempSrc:   PainSc: 0-No pain                 Glena Pharris C Tayshon Winker

## 2023-05-08 NOTE — Progress Notes (Signed)
[facility] [address] [place] [address]   Clinic Day:  05/09/2023  Referring physician: Gelene Mink, NP  Patient Care Team: Alinda Deem, MD as PCP - General (Family Medicine) Wyline Mood Dorothe Pea, MD as PCP - Cardiology (Cardiology)   ASSESSMENT & PLAN:   Assessment:  1.  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  B12 and folic acid normal. - NASH cirrhosis, low-grade esophageal and colonic/rectal varices. - Colonoscopy (05/06/2023): Rectal varices, diverticulosis in the sigmoid colon.  9 nonbleeding colonic angiodysplastic lesions, treated with APC. - EGD (05/06/2023): Grade 1 esophageal varices, portal hypertensive gastropathy, gastritis.  Multiple gastric polyps and normal duodenum. - Received Ferrlecit on 05/06/2023, 250 mg.  2.  History of [facility] colon cancer: - Underwent surgical resection in 1988, received leucovorin and 5-FU for a year as part of clinical trial at [facility].  She is still has a functioning port since then.  3.  Superior mesenteric vein thrombus: - MRI (12/10/2022): Eccentric filling defect in the SMV is persistent across many sequences suspicious for nonocclusive thrombus just below the splenic portal confluence. - She was started on Xarelto, discontinued due to anemia on 05/09/2023 - CTAP (05/04/2023): Questionable minimal residual thrombus in the SMV which may suggest a minimal residual chronic mesenteric vein thrombosis.  No filling defects and portal vein branches.  3.  Social/family history: - She lives by herself and is independent of ADLs and IADLs.  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  Non-smoker.  No exposure to chemicals. - Her daughter Clarisa Fling is a hospitalist APP at [employer] in [place]. - Sister had metastatic kidney cancer and brother had metastatic prostate cancer.  Plan:  1.  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  She is wearing a Holter monitor for workup of A-fib/flutter. - She received 1 dose of Ferrlecit on 05/06/2023.  She has occasional nausea with oral iron therapy which she is taking once a day. - I have recommended parenteral iron therapy with Monoferric 1 g x 1 dose.  She needs close monitoring of her hemoglobin and iron panel. - Recommend follow-up in 4 weeks with repeat CBC, ferritin and iron panel.  2.  SMV thrombus: - Recent CTAP showed minimal residual thrombus in the SMV.  This is overall improved from the initial MRI while she was treated with Xarelto, held on 05/09/2023. - I will continue to hold Xarelto at this time.  If her hemoglobin is improved and stable then may reintroduce Xarelto.  We will treat with Xarelto until clot completely resolves.   Orders Placed This Encounter  Procedures   CBC    Standing Status:   Future    Standing Expiration Date:   05/08/2024   Iron and TIBC ([facility] ONLY)    Standing Status:   Future    Standing Expiration Date:   05/08/2024   Ferritin    Standing Status:   Future    Standing Expiration Date:   05/08/2024      I,Katie Daubenspeck,acting as a scribe for Doreatha Massed, MD.,have documented all relevant documentation on the behalf of Doreatha Massed, MD,as directed by  Doreatha Massed, MD while in the presence of Doreatha Massed, MD.   I, Doreatha Massed MD, have reviewed the above documentation for accuracy and completeness, and I agree with the  above.   Doreatha Massed, MD   6/20/20246:31 PM  CHIEF COMPLAINT/PURPOSE OF CONSULT:   Diagnosis: iron deficiency anemia  Current Therapy:  oral ferrous sulfate BID  HISTORY OF PRESENT ILLNESS:   Lekeesha is a 82 y.o. female presenting to clinic today for evaluation of iron deficiency anemia at the request of Dr. Arbutus Leas (hospitalist).  She  was found to have abnormal CBC from 09/07/22, with a hemoglobin of 11.6. This worsened to 10.4 in 02/2023. More recently, she presented to the ED on 05/04/23 with SOB. Her hgb was 8.9 in the ED, and FOBT was positive. CBC also showed WBC 2.9, HCT 29.2%, and platelets 93k. She does have a history of low platelets secondary to liver cirrhosis, previously >100k. An anemia panel obtained on 05/06/23 showed: iron 28, iron sat 7%, ferritin 7. Colonoscopy on 05/06/23 during admission showed: rectal varices; 9 non-bleeding colonic angiodysplastic lesions, treated with APC. She was discharged on oral ferrous sulfate BID.  Denies prior history of blood transfusion.  Denies ice pica.  Today, she states that she is doing well overall. Her appetite level is at 80%. Her energy level is at 40%.  PAST MEDICAL HISTORY:   Past Medical History: 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 10/09/2020   Pancreatic cysts 10/09/2020   Portal hypertension (HCC)-gastropathy and colopathy 10/09/2020   Sleep apnea    Thrombosis, hepatic vein (HCC)    SMV   Thyroid disease    Vestibular migraine     Surgical History: Past Surgical History:  Procedure Laterality Date   ABDOMINAL HYSTERECTOMY  1987   COLON RESECTION     COLONOSCOPY     PORTACATH PLACEMENT     Never removed placed at time of colon cancer treatment   SHOULDER ARTHROSCOPY     left   TOTAL KNEE ARTHROPLASTY Left 06/08/2019   Procedure: TOTAL KNEE ARTHROPLASTY;  Surgeon: Ollen Gross, MD;  Location: [facility];  Service: Orthopedics;  Laterality: Left;    UPPER GASTROINTESTINAL ENDOSCOPY      Social History: Social History    Socioeconomic History   Marital status: Widowed    Spouse name: Not on file   Number of children: 2   Years of education: Not on file   Highest education level: Not on file  Occupational History   Occupation: retired  Tobacco Use   Smoking status: Never   Smokeless tobacco: Never  Vaping Use   Vaping Use: Never used  Substance and Sexual Activity   Alcohol use: No   Drug use: No   Sexual activity: Not Currently  Other Topics Concern   Not on file  Social History Narrative   Married, 1 son one daughter. Daughter is a Publishing rights manager.   She is retired   2 cups coffee per day   Social Determinants of Corporate investment banker Strain: Not on file  Food Insecurity: Not on file  Transportation Needs: Not on file  Physical Activity: Not on file  Stress: Not on file  Social Connections: Not on file  Intimate Partner Violence: Not on file    Family History: Family History  Problem Relation Age of Onset  Prostate cancer Brother    Lung cancer Brother    Heart disease Brother    Bone cancer Brother    Diabetes Mother    Alzheimer's disease Mother    Diabetes Brother    Heart attack Brother    Lung cancer Brother    Bone cancer Brother    Lung cancer Sister        with metz    Ulcerative colitis Sister    Colon cancer Neg Hx    Esophageal cancer Neg Hx    Stomach cancer Neg Hx    Pancreatic cancer Neg Hx    AAA (abdominal aortic aneurysm) Neg Hx     Current Medications:  Current Outpatient Medications:    albuterol (VENTOLIN HFA) 108 (90 Base) MCG/ACT inhaler, Inhale 1 puff into the lungs as needed., Disp: , Rfl:    ascorbic acid (VITAMIN C) 500 MG tablet, Take 500 mg by mouth daily., Disp: , Rfl:    Biotin 1000 MCG tablet, Take 1,000 mcg by mouth daily., Disp: , Rfl:    Cholecalciferol 50 MCG (2000 UT) TABS, Take 2,000 Units by mouth daily., Disp: , Rfl:    diphenhydrAMINE-APAP, sleep, (TYLENOL PM EXTRA STRENGTH PO), Take 1 tablet by mouth as needed.,  Disp: , Rfl:    ferrous sulfate 325 (65 FE) MG tablet, Take 1 tablet (325 mg total) by mouth 2 (two) times daily with a meal., Disp: , Rfl: 3   furosemide (LASIX) 40 MG tablet, Take 1 tablet (40 mg total) by mouth daily., Disp: 30 tablet, Rfl: 1   lactulose (CHRONULAC) 10 GM/15ML solution, Take 10 g by mouth as needed., Disp: , Rfl:    levothyroxine (SYNTHROID) 75 MCG tablet, Take 75 mcg by mouth daily before breakfast. , Disp: , Rfl:    magnesium 30 MG tablet, Take 30 mg by mouth at bedtime., Disp: , Rfl:    Multiple Vitamins-Minerals (PRESERVISION AREDS PO), Take 1 tablet by mouth daily., Disp: , Rfl:    nadolol (CORGARD) 20 MG tablet, Take 1 tablet by mouth once daily (Patient taking differently: Take 20 mg by mouth every evening.), Disp: 30 tablet, Rfl: 5   pantoprazole (PROTONIX) 40 MG tablet, Take 1 tablet (40 mg total) by mouth 2 (two) times daily before a meal., Disp: 60 tablet, Rfl: 1   pramipexole (MIRAPEX) 0.75 MG tablet, Take 0.75 mg by mouth every evening., Disp: , Rfl:    triamcinolone cream (KENALOG) 0.1 %, Apply topically., Disp: , Rfl:    vitamin B-12 (CYANOCOBALAMIN) 1000 MCG tablet, Take 1,000 mcg by mouth daily., Disp: , Rfl:    XARELTO 20 MG TABS tablet, Take 1 tablet (20 mg total) by mouth daily. Restart on 05/09/23, Disp: 30 tablet, Rfl:    Allergies: Allergies  Allergen Reactions   Latex Anaphylaxis   Codeine Palpitations    REVIEW OF SYSTEMS:   Review of Systems  Constitutional:  Negative for chills, fatigue and fever.  HENT:   Negative for lump/mass, mouth sores, nosebleeds, sore throat and trouble swallowing.   Eyes:  Negative for eye problems.  Respiratory:  Positive for shortness of breath. Negative for cough.   Cardiovascular:  Negative for chest pain, leg swelling and palpitations.  Gastrointestinal:  Negative for abdominal pain, constipation, diarrhea, nausea and vomiting.  Genitourinary:  Negative for bladder incontinence, difficulty urinating, dysuria,  frequency, hematuria and nocturia.   Musculoskeletal:  Negative for arthralgias, back pain, flank pain, myalgias and neck pain.  Skin:  Negative for itching and  rash.  Neurological:  Positive for dizziness. Negative for headaches and numbness.  Hematological:  Does not bruise/bleed easily.  Psychiatric/Behavioral:  Positive for sleep disturbance. Negative for depression and suicidal ideas. The patient is not nervous/anxious.   All other systems reviewed and are negative.    VITALS:   Blood pressure (!) 134/59, pulse 67, temperature 98.5 F (36.9 C), temperature source Oral, resp. rate 19, height 5\' 5"  (1.651 m), weight 195 lb 14.4 oz (88.9 kg), SpO2 99 %.  Wt Readings from Last 3 Encounters:  05/09/23 195 lb 14.4 oz (88.9 kg)  05/06/23 195 lb 12.3 oz (88.8 kg)  04/10/23 197 lb 12.8 oz (89.7 kg)    Body mass index is 32.6 kg/m.   PHYSICAL EXAM:   Physical Exam Vitals and nursing note reviewed. Exam conducted with a chaperone present.  Constitutional:      Appearance: Normal appearance.  Cardiovascular:     Rate and Rhythm: Normal rate and regular rhythm.     Pulses: Normal pulses.     Heart sounds: Normal heart sounds.  Pulmonary:     Effort: Pulmonary effort is normal.     Breath sounds: Normal breath sounds.  Abdominal:     Palpations: Abdomen is soft. There is no hepatomegaly, splenomegaly or mass.     Tenderness: There is no abdominal tenderness.  Musculoskeletal:     Right lower leg: No edema.     Left lower leg: No edema.  Lymphadenopathy:     Cervical: No cervical adenopathy.     Right cervical: No superficial, deep or posterior cervical adenopathy.    Left cervical: No superficial, deep or posterior cervical adenopathy.     Upper Body:     Right upper body: No supraclavicular or axillary adenopathy.     Left upper body: No supraclavicular or axillary adenopathy.  Neurological:     General: No focal deficit present.     Mental Status: She is alert and oriented  to person, place, and time.  Psychiatric:        Mood and Affect: Mood normal.        Behavior: Behavior normal.     LABS:      Latest Ref Rng & Units 05/06/2023    4:10 AM 05/05/2023    4:38 AM 05/04/2023    1:36 PM  CBC  WBC 4.0 - 10.5 K/uL 2.3  2.4  2.9   Hemoglobin 12.0 - 15.0 g/dL 8.1  8.2  8.9   Hematocrit 36.0 - 46.0 % 26.9  26.6  29.2   Platelets 150 - 400 K/uL 90  83  93       Latest Ref Rng & Units 05/06/2023    4:10 AM 05/05/2023    4:38 AM 05/04/2023    1:36 PM  CMP  Glucose 70 - 99 mg/dL 540  981  191   BUN 8 - 23 mg/dL 11  13  14    Creatinine 0.44 - 1.00 mg/dL 4.78  2.95  6.21   Sodium 135 - 145 mmol/L 132  134  131   Potassium 3.5 - 5.1 mmol/L 3.4  3.8  3.7   Chloride 98 - 111 mmol/L 96  99  98   CO2 22 - 32 mmol/L 28  24  25    Calcium 8.9 - 10.3 mg/dL 8.2  8.5  9.1   Total Protein 6.5 - 8.1 g/dL  5.8  6.7   Total Bilirubin 0.3 - 1.2 mg/dL  1.3  1.4  Alkaline Phos 38 - 126 U/L  57  74   AST 15 - 41 U/L  17  20   ALT 0 - 44 U/L  15  19      No results found for: "CEA1", "CEA" / No results found for: "CEA1", "CEA" No results found for: "PSA1" No results found for: "ZOX096" No results found for: "CAN125"  No results found for: "TOTALPROTELP", "ALBUMINELP", "A1GS", "A2GS", "BETS", "BETA2SER", "GAMS", "MSPIKE", "SPEI" Lab Results  Component Value Date   TIBC 416 05/06/2023   FERRITIN 7 (L) 05/06/2023   FERRITIN 26.9 01/02/2021   IRONPCTSAT 7 (L) 05/06/2023   No results found for: "LDH"   STUDIES:   ECHOCARDIOGRAM LIMITED  Result Date: 05/05/2023    ECHOCARDIOGRAM LIMITED REPORT   Patient Name:   EDWARDA SAUNDERS Date of Exam: 05/05/2023 Medical Rec #:  [id number]    Height:       65.0 in Accession #:    [id number]   Weight:       195.1 lb Date of Birth:  17-Feb-1941   BSA:          1.957 m Patient Age:    81 years     BP:           129/60 mmHg Patient Gender: F            HR:           51 bpm. [facility] Procedure: Limited Echo Indications:     CHF-Acute Diastolic I50.31  History:        Patient has prior history of Echocardiogram examinations, most                 recent 04/02/2023. Arrythmias:Atrial Flutter. Acute on chronic                 heart failure with preserved ejection fraction (HFpEF) (HCC).                 Obstructive sleep apnea.  Sonographer:    Celesta Gentile RCS Referring Phys: [id number] OLADAPO ADEFESO IMPRESSIONS  1. Limited echo no color/doppler of valves done.  2. Left ventricular ejection fraction, by estimation, is 60 to 65%. The left ventricle has normal function.  3. Left atrial size was mildly dilated.  4. The mitral valve is abnormal. Moderate mitral annular calcification.  5. The aortic valve is tricuspid. There is moderate calcification of the aortic valve. There is moderate thickening of the aortic valve. Aortic valve sclerosis/calcification is present, without any evidence of aortic stenosis.  6. The inferior vena cava is normal in size with <50% respiratory variability, suggesting right atrial pressure of 8 mmHg. FINDINGS  Left Ventricle: Left ventricular ejection fraction, by estimation, is 60 to 65%. The left ventricle has normal function. The left ventricular internal cavity size was normal in size. There is no left ventricular hypertrophy. Left Atrium: Left atrial size was mildly dilated. Pericardium: There is no evidence of pericardial effusion. Mitral Valve: The mitral valve is abnormal. There is mild thickening of the mitral valve leaflet(s). There is mild calcification of the mitral valve leaflet(s). Moderate mitral annular calcification. Aortic Valve: The aortic valve is tricuspid. There is moderate calcification of the aortic valve. There is moderate thickening of the aortic valve. Aortic valve sclerosis/calcification is present, without any evidence of aortic stenosis. Venous: The inferior vena cava is normal in size with less than 50% respiratory variability, suggesting right atrial pressure of 8 mmHg. IAS/Shunts:  No atrial level shunt detected by color flow Doppler. Additional Comments: Limited echo no color/doppler of valves done.  LEFT VENTRICLE PLAX 2D LVIDd:         4.40 cm LVIDs:         2.30 cm LV PW:         1.10 cm LV IVS:        1.00 cm LVOT diam:     2.00 cm LVOT Area:     3.14 cm  LEFT ATRIUM         Index LA diam:    4.10 cm 2.09 cm/m   AORTA Ao Root diam: 3.60 cm  SHUNTS Systemic Diam: 2.00 cm Charlton Haws MD Electronically signed by Charlton Haws MD Signature Date/Time: 05/05/2023/1:27:45 PM    Final    CT ABDOMEN PELVIS W CONTRAST  Result Date: 05/04/2023 CLINICAL DATA:  Abdominal pain and distention, cirrhosis of liver, shortness of breath EXAM: CT ABDOMEN AND PELVIS WITH CONTRAST TECHNIQUE: Multidetector CT imaging of the abdomen and pelvis was performed using the standard protocol following bolus administration of intravenous contrast. RADIATION DOSE REDUCTION: This exam was performed according to the departmental dose-optimization program which includes automated exposure control, adjustment of the mA and/or kV according to patient size and/or use of iterative reconstruction technique. CONTRAST:  75mL OMNIPAQUE IOHEXOL 350 MG/ML SOLN COMPARISON:  12/27/2022 FINDINGS: Lower chest: Minimal bilateral pleural effusions are seen. Hepatobiliary: There is minimal nodularity in liver surface. Gallbladder is not distended. There is no wall thickening in gallbladder. Small amount of fluid is noted adjacent to the gallbladder, possibly part small perihepatic ascites. Pancreas: No focal abnormalities are seen. Spleen: Spleen measures 18.1 cm in AP diameter. There is trace amount of ascites adjacent to the spleen. Adrenals/Urinary Tract: Adrenals are unremarkable. There is no hydronephrosis in the left kidney. There is slight prominence of collecting system in the right kidney. There are no demonstrable renal or ureteral stones. Urinary bladder is unremarkable. Stomach/Bowel: Stomach is not distended. Small bowel  loops are not dilated. Appendix is not seen. There is no pericecal inflammation. There is no significant wall thickening in colon. There is no pericolic stranding. Vascular/Lymphatic: Arterial calcifications are seen in aorta and its major branches. In the previous study, portal venous thrombosis was seen in SMV. There is questionable minimal residual thrombus in the SMV. There are no filling defects in portal venous branches. There is ectasia of vessels along the greater curvature and the lesser curvature aspect of stomach suggesting possible portal hypertension. Reproductive: Uterus is not seen. Other: There is minimal ascites. There is no pneumoperitoneum. Umbilical hernia containing fat is seen. Right inguinal hernia containing fat is seen. Musculoskeletal: There is minimal anterolisthesis at the L4-L5 level. There is minimal retrolisthesis at L1-L2 and L2-L3 levels. Degenerative changes are noted with facet hypertrophy, more so in the lower lumbar spine. Spinal stenosis and encroachment of neural foramina is seen at multiple levels. IMPRESSION: There is no evidence of intestinal obstruction or pneumoperitoneum. There is slight prominence of the pelvocaliceal system in the right kidney which may be residual from previous obstruction. There are no demonstrable renal or ureteral stones. There is minimal nodularity in liver suggesting possible cirrhosis. Enlarged spleen. Small ascites. Portal hypertension. Minimal bilateral pleural effusions. There is possible minimal filling defect in superior mesenteric vein which may suggest minimal residual chronic mesenteric vein thrombosis. Aortic arteriosclerosis.  Lumbar spondylosis. Other findings as described in the body of the report. Electronically Signed   By: Ernie Avena  M.D.   On: 05/04/2023 18:28   CT Angio Chest PE W and/or Wo Contrast  Result Date: 05/04/2023 CLINICAL DATA:  Shortness of breath, clinical suspicion for PE EXAM: CT ANGIOGRAPHY CHEST WITH  CONTRAST TECHNIQUE: Multidetector CT imaging of the chest was performed using the standard protocol during bolus administration of intravenous contrast. Multiplanar CT image reconstructions and MIPs were obtained to evaluate the vascular anatomy. RADIATION DOSE REDUCTION: This exam was performed according to the departmental dose-optimization program which includes automated exposure control, adjustment of the mA and/or kV according to patient size and/or use of iterative reconstruction technique. CONTRAST:  75mL OMNIPAQUE IOHEXOL 350 MG/ML SOLN COMPARISON:  Chest radiograph done today FINDINGS: Cardiovascular: There is homogeneous enhancement in thoracic aorta. There are no intraluminal filling defects in central pulmonary artery and branches. Evaluation of small peripheral branches is less than optimal. Scattered calcifications are seen in thoracic aorta. There is dense calcification in mitral annulus. Small scattered coronary artery calcifications are seen. Mediastinum/Nodes: No significant lymphadenopathy is seen. Lungs/Pleura: There is no focal pulmonary consolidation. Increased interstitial markings are seen in the posterior aspects of both lower lung fields. Minimal bilateral pleural effusions are seen. There is no pneumothorax. Upper Abdomen: Small ascites is present in perihepatic region. AP diameter of spleen measures 17.6 cm. Musculoskeletal: Degenerative changes are noted in cervical and thoracic spine. There is encroachment of neural foramina by bony spurs in lower cervical spine. Review of the MIP images confirms the above findings. IMPRESSION: There is no evidence of pulmonary artery embolism. There is no evidence of thoracic aortic dissection. There is no focal pulmonary consolidation. Minimal bilateral pleural effusions. There is slight increase in interstitial markings in the posterior lower lung fields suggesting possible scarring or subsegmental atelectasis. Small ascites. Splenomegaly. Aortic  arteriosclerosis.  Coronary artery calcifications are seen. Electronically Signed   By: Ernie Avena M.D.   On: 05/04/2023 18:11   DG Chest Port 1 View  Result Date: 05/04/2023 CLINICAL DATA:  Shortness of breath EXAM: PORTABLE CHEST 1 VIEW COMPARISON:  03/01/2023 FINDINGS: Transverse diameter of heart is increased. Tip of right subclavian chest port is seen in superior vena cava. There are no signs of pulmonary edema or focal pulmonary consolidation. There is no pleural effusion or pneumothorax. Degenerative changes are noted in both shoulders. IMPRESSION: There are no signs of pulmonary edema or focal pulmonary consolidation. Electronically Signed   By: Ernie Avena M.D.   On: 05/04/2023 14:49

## 2023-05-09 ENCOUNTER — Inpatient Hospital Stay: Payer: MEDICARE | Attending: Hematology | Admitting: Hematology

## 2023-05-09 ENCOUNTER — Encounter (HOSPITAL_COMMUNITY): Payer: Self-pay | Admitting: Internal Medicine

## 2023-05-09 VITALS — BP 134/59 | HR 67 | Temp 98.5°F | Resp 19 | Ht 65.0 in | Wt 195.9 lb

## 2023-05-09 DIAGNOSIS — Z7901 Long term (current) use of anticoagulants: Secondary | ICD-10-CM | POA: Diagnosis not present

## 2023-05-09 DIAGNOSIS — K7581 Nonalcoholic steatohepatitis (NASH): Secondary | ICD-10-CM | POA: Insufficient documentation

## 2023-05-09 DIAGNOSIS — Z85038 Personal history of other malignant neoplasm of large intestine: Secondary | ICD-10-CM

## 2023-05-09 DIAGNOSIS — D508 Other iron deficiency anemias: Secondary | ICD-10-CM

## 2023-05-09 DIAGNOSIS — D500 Iron deficiency anemia secondary to blood loss (chronic): Secondary | ICD-10-CM | POA: Diagnosis present

## 2023-05-09 DIAGNOSIS — I82890 Acute embolism and thrombosis of other specified veins: Secondary | ICD-10-CM | POA: Diagnosis not present

## 2023-05-09 LAB — SURGICAL PATHOLOGY

## 2023-05-09 NOTE — Patient Instructions (Signed)
You were seen and examined today by Dr. Ellin Saba. Dr. Ellin Saba is a hematologist, meaning that he specializes in blood abnormalities. Dr. Ellin Saba discussed your past medical history, family history of cancers/blood conditions and the events that led to you being here today.  You were referred to Dr. Ellin Saba due to iron deficiency anemia.  Dr. Ellin Saba has recommended you receive and IV iron infusion to correct this.  We will see you back in 4 weeks. We will repeat lab work prior to your next visit.   Follow-up as scheduled.

## 2023-05-13 ENCOUNTER — Inpatient Hospital Stay: Payer: MEDICARE

## 2023-05-13 VITALS — BP 117/51 | HR 50 | Temp 97.6°F | Resp 18

## 2023-05-13 DIAGNOSIS — D509 Iron deficiency anemia, unspecified: Secondary | ICD-10-CM

## 2023-05-13 DIAGNOSIS — D500 Iron deficiency anemia secondary to blood loss (chronic): Secondary | ICD-10-CM | POA: Diagnosis not present

## 2023-05-13 MED ORDER — ACETAMINOPHEN 325 MG PO TABS
650.0000 mg | ORAL_TABLET | Freq: Once | ORAL | Status: AC
  Administered 2023-05-13: 650 mg via ORAL
  Filled 2023-05-13: qty 2

## 2023-05-13 MED ORDER — SODIUM CHLORIDE 0.9 % IV SOLN: Freq: Once | INTRAVENOUS | Status: AC

## 2023-05-13 MED ORDER — SODIUM CHLORIDE 0.9 % IV SOLN
1000.0000 mg | Freq: Once | INTRAVENOUS | Status: AC
  Administered 2023-05-13: 1000 mg via INTRAVENOUS
  Filled 2023-05-13: qty 10

## 2023-05-13 MED ORDER — CETIRIZINE HCL 10 MG PO TABS
10.0000 mg | ORAL_TABLET | Freq: Once | ORAL | Status: AC
  Administered 2023-05-13: 10 mg via ORAL
  Filled 2023-05-13: qty 1

## 2023-05-13 NOTE — Patient Instructions (Signed)
[facility]  Discharge Instructions: Thank you for choosing [facility] to provide your oncology and hematology care.  If you have a lab appointment with the Cancer Center - please note that after April 8th, 2024, all labs will be drawn in the cancer center.  You do not have to check in or register with the main entrance as you have in the past but will complete your check-in in the cancer center.  Wear comfortable clothing and clothing appropriate for easy access to any Portacath or PICC line.   We strive to give you quality time with your provider. You may need to reschedule your appointment if you arrive late (15 or more minutes).  Arriving late affects you and other patients whose appointments are after yours.  Also, if you miss three or more appointments without notifying the office, you may be dismissed from the clinic at the provider's discretion.      For prescription refill requests, have your pharmacy contact our office and allow 72 hours for refills to be completed.    Today you received Monoferric IV iron infusion.   BELOW ARE SYMPTOMS THAT SHOULD BE REPORTED IMMEDIATELY: *FEVER GREATER THAN 100.4 F (38 C) OR HIGHER *CHILLS OR SWEATING *NAUSEA AND VOMITING THAT IS NOT CONTROLLED WITH YOUR NAUSEA MEDICATION *UNUSUAL SHORTNESS OF BREATH *UNUSUAL BRUISING OR BLEEDING *URINARY PROBLEMS (pain or burning when urinating, or frequent urination) *BOWEL PROBLEMS (unusual diarrhea, constipation, pain near the anus) TENDERNESS IN MOUTH AND THROAT WITH OR WITHOUT PRESENCE OF ULCERS (sore throat, sores in mouth, or a toothache) UNUSUAL RASH, SWELLING OR PAIN  UNUSUAL VAGINAL DISCHARGE OR ITCHING   Items with * indicate a potential emergency and should be followed up as soon as possible or go to the Emergency Department if any problems should occur.  Please show the CHEMOTHERAPY ALERT CARD or IMMUNOTHERAPY ALERT CARD at check-in to the Emergency Department  and triage nurse.  Should you have questions after your visit or need to cancel or reschedule your appointment, please contact [facility] [phone]  and follow the prompts.  Office hours are 8:00 a.m. to 4:30 p.m. Monday - Friday. Please note that voicemails left after 4:00 p.m. may not be returned until the following business day.  We are closed weekends and major holidays. You have access to a nurse at all times for urgent questions. Please call the main number to the clinic [phone] and follow the prompts.  For any non-urgent questions, you may also contact your provider using MyChart. We now offer e-Visits for anyone 49 and older to request care online for non-urgent symptoms. For details visit [email or web address].   Also download the MyChart app! Go to the app store, search "MyChart", open the app, select [place], and log in with your MyChart username and password.

## 2023-05-13 NOTE — Progress Notes (Signed)
Patient presents today for iron infusion. Patient is in satisfactory condition with no new complaints voiced.  Vital signs are stable.  We will proceed with infusion per provider orders.    Peripheral IV started with good blood return pre and post infusion.  Monoferric 1,000 mg given today per MD orders. Tolerated infusion without adverse affects. Vital signs stable. No complaints at this time. Discharged from clinic ambulatory in stable condition. Alert and oriented x 3. F/U with [facility] as scheduled.

## 2023-05-16 ENCOUNTER — Ambulatory Visit (INDEPENDENT_AMBULATORY_CARE_PROVIDER_SITE_OTHER): Payer: MEDICARE | Admitting: Internal Medicine

## 2023-05-16 ENCOUNTER — Encounter: Payer: Self-pay | Admitting: Internal Medicine

## 2023-05-16 VITALS — BP 110/60 | HR 63 | Ht 65.0 in | Wt 194.0 lb

## 2023-05-16 DIAGNOSIS — K55069 Acute infarction of intestine, part and extent unspecified: Secondary | ICD-10-CM | POA: Diagnosis not present

## 2023-05-16 DIAGNOSIS — K862 Cyst of pancreas: Secondary | ICD-10-CM

## 2023-05-16 DIAGNOSIS — K766 Portal hypertension: Secondary | ICD-10-CM

## 2023-05-16 DIAGNOSIS — K7460 Unspecified cirrhosis of liver: Secondary | ICD-10-CM

## 2023-05-16 DIAGNOSIS — K7581 Nonalcoholic steatohepatitis (NASH): Secondary | ICD-10-CM

## 2023-05-16 DIAGNOSIS — D500 Iron deficiency anemia secondary to blood loss (chronic): Secondary | ICD-10-CM | POA: Diagnosis not present

## 2023-05-16 DIAGNOSIS — K769 Liver disease, unspecified: Secondary | ICD-10-CM

## 2023-05-16 DIAGNOSIS — I8510 Secondary esophageal varices without bleeding: Secondary | ICD-10-CM

## 2023-05-16 NOTE — Progress Notes (Signed)
Cristina Santos 81 y.o. 07-18-1941 [id number]  Assessment & Plan:   Encounter Diagnoses  Name Primary?   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  She will continue her iron therapy, she has had 2 parenteral treatments and follow-up with Dr. Ellin Saba and he will determine when to resume Xarelto.  I think it is reasonable to hold it right now and she will follow-up with him rather than the prior hematologist in [place].  She is on nadolol also does not need surveillance of varices.  I suspect that the Xarelto tipped her over and made her leak blood more than she might have been and made this problem worse.  From a liver perspective otherwise she seems stable.  I reviewed the labs from that hospitalization.   09/16/2023 is her next appointment with me sooner as needed CC: Alinda Deem, MD   Subjective:   Chief Complaint: Follow-up after iron deficiency anemia diagnosis  HPI 82 year old white woman with NASH cirrhosis, was admitted at [facility] in mid June with iron deficiency anemia and heme positive stool.  She was found to have portal colopathy and portal gastropathy and nonbleeding varices.  She was cared for by Dr. Marletta Lor, he did EGD and colonoscopy he performed APC treatment of the telangiectasia.  She had 1 parenteral iron treatment in the hospital and has had a subsequent 1 and is seen Dr. Ellin Saba as an outpatient and will follow with him.  Her Xarelto is currently held.  Previously seen superior mesenteric vein thrombosis is much better though not resolved.  She has a follow-up pending with Dr. Ellin Saba next month. DOE better No  bleeding CT abdomen and pelvis with contrast IMPRESSION: There is no evidence of intestinal obstruction or pneumoperitoneum. There is slight prominence of the pelvocaliceal system in the right kidney which may be residual from previous obstruction. There are no demonstrable renal or ureteral stones.   There is minimal nodularity in liver suggesting possible cirrhosis. Enlarged spleen. Small ascites. Portal hypertension. Minimal bilateral pleural effusions. There is possible minimal filling defect in superior mesenteric vein which may suggest minimal residual chronic mesenteric vein thrombosis.   Aortic arteriosclerosis.  Lumbar spondylosis.   Other findings as described in the body of the report.     Electronically Signed   By: Ernie Avena M.D.   On: 05/04/2023 18:28    Latest Ref Rng & Units 05/06/2023    4:10 AM 05/05/2023    4:38 AM 05/04/2023    1:36 PM  CBC  WBC 4.0 - 10.5 K/uL 2.3  2.4  2.9   Hemoglobin 12.0 - 15.0 g/dL 8.1  8.2  8.9   Hematocrit 36.0 - 46.0 % 26.9  26.6  29.2   Platelets 150 - 400 K/uL 90  83  93    Lab Results  Component Value Date   FERRITIN 7 (L) 05/06/2023     Chemistry      Component Value Date/Time   NA 132 (L) 05/06/2023 0410   NA 135 04/23/2023 1508   K 3.4 (L) 05/06/2023 0410   CL 96 (L) 05/06/2023 0410   CO2 28 05/06/2023 0410  BUN 11 05/06/2023 0410   BUN 20 04/23/2023 1508   CREATININE 0.83 05/06/2023 0410      Component Value Date/Time   CALCIUM 8.2 (L) 05/06/2023 0410   ALKPHOS 57 05/05/2023 0438   AST 17 05/05/2023 0438   ALT 15 05/05/2023 0438   BILITOT 1.3 (H) 05/05/2023 0438        Allergies  Allergen Reactions   Latex Anaphylaxis   Codeine Palpitations   Current Meds  Medication Sig   albuterol (VENTOLIN HFA) 108 (90 Base) MCG/ACT inhaler Inhale 1 puff into the lungs as needed.   ascorbic acid (VITAMIN C) 500 MG tablet Take 500 mg by mouth daily.   Biotin 1000 MCG tablet Take 1,000 mcg by mouth  daily.   Cholecalciferol 50 MCG (2000 UT) TABS Take 2,000 Units by mouth daily.   diphenhydrAMINE-APAP, sleep, (TYLENOL PM EXTRA STRENGTH PO) Take 1 tablet by mouth as needed.   ferrous sulfate 325 (65 FE) MG tablet Take 1 tablet (325 mg total) by mouth 2 (two) times daily with a meal.   furosemide (LASIX) 40 MG tablet Take 1 tablet (40 mg total) by mouth daily.   lactulose (CHRONULAC) 10 GM/15ML solution Take 10 g by mouth as needed.   levothyroxine (SYNTHROID) 75 MCG tablet Take 75 mcg by mouth daily before breakfast.    magnesium 30 MG tablet Take 30 mg by mouth at bedtime.   Multiple Vitamins-Minerals (PRESERVISION AREDS PO) Take 1 tablet by mouth daily.   nadolol (CORGARD) 20 MG tablet Take 1 tablet by mouth once daily (Patient taking differently: Take 20 mg by mouth every evening.)   pantoprazole (PROTONIX) 40 MG tablet Take 1 tablet (40 mg total) by mouth 2 (two) times daily before a meal.   pramipexole (MIRAPEX) 0.75 MG tablet Take 0.75 mg by mouth every evening.   triamcinolone cream (KENALOG) 0.1 % Apply topically.   vitamin B-12 (CYANOCOBALAMIN) 1000 MCG tablet Take 1,000 mcg by mouth daily.   XARELTO 20 MG TABS tablet Take 1 tablet (20 mg total) by mouth daily. Restart on 05/09/23   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 10/09/2020   Pancreatic cysts 10/09/2020   Portal hypertension (HCC)-gastropathy and colopathy 10/09/2020   Sleep apnea    Thrombosis, hepatic vein (HCC)    SMV   Thyroid disease    Vestibular migraine    Past Surgical History:  Procedure Laterality Date   ABDOMINAL HYSTERECTOMY  1987   BIOPSY  05/06/2023   Procedure: BIOPSY;  Surgeon: Lanelle Bal, DO;  Location: [facility];  Service: Endoscopy;;   COLON RESECTION     COLONOSCOPY     COLONOSCOPY WITH PROPOFOL N/A 05/06/2023   Procedure: COLONOSCOPY WITH PROPOFOL;  Surgeon: Lanelle Bal, DO;  Location: [facility];  Service: Endoscopy;  Laterality: N/A;   ESOPHAGOGASTRODUODENOSCOPY (EGD) WITH PROPOFOL N/A 05/06/2023   Procedure: ESOPHAGOGASTRODUODENOSCOPY (EGD) WITH PROPOFOL;  Surgeon: Lanelle Bal, DO;  Location: [facility];  Service: Endoscopy;  Laterality: N/A;   HOT HEMOSTASIS  05/06/2023   Procedure: HOT HEMOSTASIS (ARGON PLASMA COAGULATION/BICAP);  Surgeon: Lanelle Bal, DO;  Location: [facility];  Service:  Endoscopy;;   POLYPECTOMY  05/06/2023   Procedure: POLYPECTOMY;  Surgeon: Lanelle Bal, DO;  [facility]: [facility];  Service: Endoscopy;;   PORTACATH PLACEMENT     Never removed placed at time of colon cancer treatment   SHOULDER ARTHROSCOPY     left   TOTAL KNEE ARTHROPLASTY Left 06/08/2019   Procedure: TOTAL KNEE ARTHROPLASTY;  Surgeon: Ollen Gross, MD;  Location: [facility];  Service: Orthopedics;  Laterality: Left;    UPPER GASTROINTESTINAL ENDOSCOPY     Social History   Social History Narrative   Married, 1 son one daughter. Daughter is a Publishing rights manager.   She is retired   2 cups coffee per day   family history includes Alzheimer's disease in her mother; Bone cancer in her brother and brother; Diabetes in her brother and mother; Heart attack in her brother; Heart disease in her brother; Lung cancer in her brother, brother, and sister; Prostate cancer in her brother; Ulcerative colitis in her sister.   Review of Systems LE rash  Objective:   Physical Exam @BP  110/60   Pulse 63   Ht 5\' 5"  (1.651 m)   Wt 194 lb (88 kg)   BMI 32.28 kg/m @  General:  NAD Eyes:   anicteric Lungs:  clear Heart::  S1S2 no rubs, murmurs or gallops Abdomen:  soft and nontender, BS+ Ext:   no edema, cyanosis or clubbing    Data  Reviewed:  See HPI

## 2023-05-16 NOTE — Patient Instructions (Signed)
_______________________________________________________  If your blood pressure at your visit was 140/90 or greater, please contact your primary care physician to follow up on this.  _______________________________________________________  If you are age 84 or older, your body mass index should be between 23-30. Your Body mass index is 32.28 kg/m. If this is out of the aforementioned range listed, please consider follow up with your Primary Care Provider.  If you are age 7 or younger, your body mass index should be between 19-25. Your Body mass index is 32.28 kg/m. If this is out of the aformentioned range listed, please consider follow up with your Primary Care Provider.   ________________________________________________________  The [facility] providers would like to encourage you to use [facility] to communicate with providers for non-urgent requests or questions.  Due to long hold times on the telephone, sending your provider a message by [facility] may be a faster and more efficient way to get a response.  Please allow 48 business hours for a response.  Please remember that this is for non-urgent requests.  _______________________________________________________  I appreciate the opportunity to care for you. Stan Head, MD, [facility]

## 2023-05-22 ENCOUNTER — Encounter: Payer: Self-pay | Admitting: Nurse Practitioner

## 2023-05-22 ENCOUNTER — Telehealth: Payer: Self-pay | Admitting: Nurse Practitioner

## 2023-05-22 ENCOUNTER — Ambulatory Visit: Payer: MEDICARE | Attending: Nurse Practitioner | Admitting: Nurse Practitioner

## 2023-05-22 VITALS — BP 130/66 | HR 57 | Ht 65.0 in | Wt 181.4 lb

## 2023-05-22 DIAGNOSIS — I4892 Unspecified atrial flutter: Secondary | ICD-10-CM

## 2023-05-22 DIAGNOSIS — Z79899 Other long term (current) drug therapy: Secondary | ICD-10-CM

## 2023-05-22 DIAGNOSIS — K55069 Acute infarction of intestine, part and extent unspecified: Secondary | ICD-10-CM | POA: Diagnosis present

## 2023-05-22 DIAGNOSIS — I480 Paroxysmal atrial fibrillation: Secondary | ICD-10-CM

## 2023-05-22 DIAGNOSIS — I5032 Chronic diastolic (congestive) heart failure: Secondary | ICD-10-CM | POA: Diagnosis present

## 2023-05-22 DIAGNOSIS — R002 Palpitations: Secondary | ICD-10-CM

## 2023-05-22 DIAGNOSIS — E611 Iron deficiency: Secondary | ICD-10-CM | POA: Diagnosis present

## 2023-05-22 DIAGNOSIS — D500 Iron deficiency anemia secondary to blood loss (chronic): Secondary | ICD-10-CM

## 2023-05-22 MED ORDER — POTASSIUM CHLORIDE CRYS ER 20 MEQ PO TBCR: 20.0000 meq | EXTENDED_RELEASE_TABLET | Freq: Every day | ORAL | 3 refills | Status: AC

## 2023-05-22 MED ORDER — EMPAGLIFLOZIN 10 MG PO TABS
10.0000 mg | ORAL_TABLET | Freq: Every day | ORAL | 3 refills | Status: DC
Start: 2023-05-22 — End: 2023-09-23

## 2023-05-22 NOTE — Patient Instructions (Addendum)
Medication Instructions:  Your physician has recommended you make the following change in your medication:  Start taking potassium chloride 20 MeQ daily Start taking Jardiance 10 mg daily Continue all other medications as perscribed  Labwork: BMET in 2 weeks at [facility]  Testing/Procedures: none  Follow-Up: Your physician recommends that you schedule a follow-up appointment in: 6-8 weeks with Philis Nettle  Any Other Special Instructions Will Be Listed Below (If Applicable).  If you need a refill on your cardiac medications before your next appointment, please call your pharmacy.

## 2023-05-22 NOTE — Telephone Encounter (Signed)
Completed - Spoke with patient about starting jardiance as discussed in her appointment today

## 2023-05-22 NOTE — Progress Notes (Signed)
Cardiology Office Note:  .   Date:  05/22/2023  ID:  Cristina Santos, DOB 1941-07-12, MRN [id number] PCP: Alinda Deem, MD  [facility] Providers Cardiologist:  Dina Rich, MD    History of Present Illness: .   Cristina Santos is a 82 y.o. female with a PMH of HFpEF, chest pain/DOE, hx of NASH, liver cirrhosis, hx of blood loss anemia, iron deficiency (sees Heme/Onc), thrombocytopenia, and hx of superior mesenteric vein thrombosis, who presents to the office today for 6 week follow-up.   Last seen by Dr. Dina Rich on Apr 10, 2023. Evaluated for CP/DOE. Echocardiogram report noted below from 03/2023. Noted some signs of volume overload on exam, started Lasix 20 mg daily. NST was declined at the time by patient.   Hospitalized 04/2023 for acute on chronic HFpEF and hemoccult positive. Echo revealed normal EF. Received IV Lasix. Hospital course noted by PAF/A-flutter on EKG, wore Zio monitor at time of d/c. Xarelto held in setting of blood loss anemia. Underwent EGD/colonoscopy - see report below.   Today she presents for 6 week follow-up. Doing well on Lasix, breathing has improved per her report. Turned in her monitor recently. Shows me her log that reveals stable weight and vital signs overall. Denies any chest pain, worsening shortness of breath, syncope, presyncope, dizziness, orthopnea, PND, significant weight changes, acute bleeding, or claudication. Occasional brief sensation of palpitations. Lower extremity swelling has improved.  SH: Daughter Aurther Loft works as NP in ED.   Studies Reviewed: .       05/06/23 colonoscopy--9 nonbleeding colon angioectasias tx'ed with APC; rectal varices; transverse and ascending colon polyps -05/06/23 EGD--grade 1 esophageal varices; portal HTN gastropathy, +gastric polyp  Limited Echo 04/2023:  1. Limited echo no color/doppler of valves done.   2. Left ventricular ejection fraction, by estimation, is 60 to 65%. The  left ventricle has normal  function.   3. Left atrial size was mildly dilated.   4. The mitral valve is abnormal. Moderate mitral annular calcification.   5. The aortic valve is tricuspid. There is moderate calcification of the  aortic valve. There is moderate thickening of the aortic valve. Aortic  valve sclerosis/calcification is present, without any evidence of aortic  stenosis.   6. The inferior vena cava is normal in size with <50% respiratory  variability, suggesting right atrial pressure of 8 mmHg.  Risk Assessment/Calculations:    CHA2DS2-VASc Score = 4  This indicates a 4.8% annual risk of stroke. The patient's score is based upon: CHF History: 1 HTN History: 0 Diabetes History: 0 Stroke History: 0 Vascular Disease History: 0 Age Score: 2 Gender Score: 1   Physical Exam:   VS:  BP 130/66   Pulse (!) 57   Ht 5\' 5"  (1.651 m)   Wt 181 lb 6.4 oz (82.3 kg)   SpO2 100%   BMI 30.19 kg/m    Wt Readings from Last 3 Encounters:  05/22/23 181 lb 6.4 oz (82.3 kg)  05/16/23 194 lb (88 kg)  05/09/23 195 lb 14.4 oz (88.9 kg)    GEN: Well nourished, well developed in no acute distress NECK: No JVD; No carotid bruits CARDIAC: S1/S2, RRR with occasional early beats noted, no murmurs, rubs, gallops RESPIRATORY:  Clear to auscultation without rales, wheezing or rhonchi  ABDOMEN: Soft, non-tender, non-distended EXTREMITIES:  1+ pitting edema to LLE, nonpitting edema to RLE; No deformity   ASSESSMENT AND PLAN: .    Chronic HFpEF, medication management Stage C, NYHA class  I-II symptoms. Weight is down since last month. EF 60-65%, grade 2 DD. No signs of volume overload on exam. Will start potassium supplement and will continue Lasix and nadolol. Consulted clinical pharmacist, Megan Supple regarding patient's PMH and will initiate Jardiance 10 mg daily. Will obtain BMET with her upcoming lab work per her request. Low sodium diet, fluid restriction <2L, and daily weights encouraged. Educated to contact our office  for weight gain of 2 lbs overnight or 5 lbs in one week.   PAF/A-flutter, palpitations Denies any tachycardia or palpitations. Recently mailed back monitor that is pending. HR well controlled today. Continue nadolol and Xarelto is currently on hold, has pending labs with Hem/Onc - see #4. Heart healthy diet and regular cardiovascular exercise encouraged.   Chronic SMV thrombosis Denies any symptoms. Xarelto currently on hold secondary to blood loss anemia. Continue to follow-up with Hem/Onc.   Blood loss anemia, Iron deficiency  Last Hgb stable at 8.2. Denies any bleeding issues. Has upcoming labs with Dr. Kirtland Bouchard. Continue to follow-up with Hem/Onc.   Dispo: Follow-up with me or APP in 6-8 weeks or sooner if anything changes.   Signed, Sharlene Dory, NP

## 2023-05-29 ENCOUNTER — Inpatient Hospital Stay: Payer: MEDICARE | Attending: Hematology | Admitting: Hematology

## 2023-05-29 DIAGNOSIS — K3189 Other diseases of stomach and duodenum: Secondary | ICD-10-CM | POA: Insufficient documentation

## 2023-05-29 DIAGNOSIS — Z79899 Other long term (current) drug therapy: Secondary | ICD-10-CM

## 2023-05-29 DIAGNOSIS — D508 Other iron deficiency anemias: Secondary | ICD-10-CM

## 2023-05-29 DIAGNOSIS — D500 Iron deficiency anemia secondary to blood loss (chronic): Secondary | ICD-10-CM | POA: Diagnosis not present

## 2023-05-29 LAB — BASIC METABOLIC PANEL
Anion gap: 7 (ref 5–15)
BUN: 16 mg/dL (ref 8–23)
CO2: 26 mmol/L (ref 22–32)
Calcium: 9.5 mg/dL (ref 8.9–10.3)
Chloride: 99 mmol/L (ref 98–111)
Creatinine, Ser: 0.85 mg/dL (ref 0.44–1.00)
GFR, Estimated: >60 mL/min (ref >60)
Glucose, Bld: 122 mg/dL — ABNORMAL HIGH (ref 70–99)
Potassium: 4.8 mmol/L (ref 3.5–5.1)
Sodium: 132 mmol/L — ABNORMAL LOW (ref 135–145)

## 2023-05-29 LAB — CBC
HCT: 38.4 % (ref 36.0–46.0)
Hemoglobin: 11.8 g/dL — ABNORMAL LOW (ref 12.0–15.0)
MCH: 26.5 pg (ref 26.0–34.0)
MCHC: 30.7 g/dL (ref 30.0–36.0)
MCV: 86.1 fL (ref 80.0–100.0)
Platelets: 144 10*3/uL — ABNORMAL LOW (ref 150–400)
RBC: 4.46 MIL/uL (ref 3.87–5.11)
RDW: 20.2 % — ABNORMAL HIGH (ref 11.5–15.5)
WBC: 4.5 10*3/uL (ref 4.0–10.5)
nRBC: 0 % (ref 0.0–0.2)

## 2023-05-29 LAB — IRON AND TIBC
Iron: 78 ug/dL (ref 28–170)
Saturation Ratios: 24 % (ref 10.4–31.8)
TIBC: 321 ug/dL (ref 250–450)
UIBC: 243 ug/dL

## 2023-05-29 LAB — FERRITIN: Ferritin: 273 ng/mL (ref 11–307)

## 2023-06-05 ENCOUNTER — Ambulatory Visit: Payer: MEDICARE | Admitting: Hematology

## 2023-06-05 NOTE — Progress Notes (Signed)
[facility] 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 82 y.o. 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  She was seen for initial consultation by Dr. Ellin Saba on 05/09/2023.  Since her last visit, she received Monoferric (1 g) on 05/13/2023.  She reports feeling better after IV iron.  She still has some dyspnea on exertion, but this is much better than it was.  Her energy has improved, but is not yet "at 100%."  She has chronic restless legs, but reports improvement in her leg cramps.  She denies any pica, headaches, chest pain, lightheadedness, or syncope.  She has not yet restarted Xarelto for her SMV thrombus.  She denies any associated nausea, vomiting, abdominal pain.  She has not noticed any melena, hematochezia or other signs of overt gastrointestinal bleeding.  She has 90% energy and 100% appetite. She endorses that she is maintaining a stable weight.  ASSESSMENT & PLAN:  1.  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  B12 and folic acid normal. - NASH cirrhosis, low-grade esophageal and colonic/rectal varices. - Colonoscopy (05/06/2023): Rectal varices, diverticulosis in the sigmoid colon.  9 nonbleeding colonic angiodysplastic lesions, treated with APC. - EGD (05/06/2023): Grade 1 esophageal varices, portal hypertensive gastropathy, gastritis.  Multiple gastric polyps and normal duodenum. - Received Ferrlecit on 05/06/2023, 250 mg.  Monoferric (1 g) on 05/13/2023. - She is taking iron tablet daily.   - Most recent labs (05/29/2023): Hgb 11.8/MCV 86.1.   Ferritin 273, iron saturation 24%. - She denies any obvious rectal bleeding or melena. - PLAN: No indication for IV iron at this time. - She remains at risk for ongoing GI bleeding, therefore we will continue close monitoring of CBC and iron levels. - Recommend monthly CBC with repeat CBC/iron panel and RTC in 2 months   2.  Superior mesenteric vein thrombus: - MRI (12/10/2022): Eccentric filling defect in the SMV is persistent across many sequences suspicious for nonocclusive thrombus just below the splenic portal confluence. 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  No filling defects and portal vein branches. - She is also wearing Holter monitor for workup of atrial fibrillation/flutter, which could be further indication for anticoagulation. - PLAN: Since hemoglobin has improved, we will restart her Xarelto at this time.  Continue close monitoring of CBC/D. - We will treat with Xarelto until clot completely resolves. -- Recheck CTAP in 3-6 months  3.  History of [facility] colon cancer: - Underwent surgical resection in 1988, received leucovorin and 5-FU for a year as part of clinical trial at [facility].  She is still has a functioning port since then. - PLAN: Port flush twice yearly. (Most recent on 05/13/2023.)  Prefers peripheral lab draw for routine labs.  4.  Social/family history: - She lives by herself and is independent of ADLs and IADLs.  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  Non-smoker.  No exposure to chemicals. -  Her daughter Clarisa Fling is a hospitalist APP at [employer] in [place]. - Sister had metastatic kidney cancer and brother had metastatic prostate cancer.  PLAN SUMMARY: >> Monthly CBC/BB sample >> Labs in 2 months = CBC/D,  ferritin, iron/TIBC, D-dimer >> OFFICE visit in 2 months     REVIEW OF SYSTEMS:   Review of Systems  Constitutional:  Positive for fatigue. Negative for appetite change, chills, diaphoresis, fever and unexpected weight change.  HENT:   Negative for lump/mass and nosebleeds.   Eyes:  Negative for eye problems.  Respiratory:  Positive for shortness of breath (With exertion, improved). Negative for cough, hemoptysis and wheezing.   Cardiovascular:  Negative for chest pain, leg swelling and palpitations.  Gastrointestinal:  Negative for abdominal pain, blood in stool, constipation, diarrhea, nausea and vomiting.  Genitourinary:  Negative for hematuria.   Skin: Negative.   Neurological:  Negative for dizziness, headaches and light-headedness.  Hematological:  Does not bruise/bleed easily.     PHYSICAL EXAM:  ECOG PERFORMANCE STATUS: 1 - Symptomatic but completely ambulatory  Vitals:   06/06/23 1256 06/06/23 1303  BP: (!) 154/59 (!) 146/70  Pulse: 73   Resp: 18   Temp: (!) 97.5 F (36.4 C)   SpO2: 98%    Filed Weights   06/06/23 1256  Weight: 191 lb 9.3 oz (86.9 kg)   Physical Exam Constitutional:      Appearance: Normal appearance. She is obese.  Cardiovascular:     Heart sounds: Normal heart sounds.  Pulmonary:     Breath sounds: Normal breath sounds.  Neurological:     General: No focal deficit present.     Mental Status: Mental status is at baseline.  Psychiatric:        Behavior: Behavior normal. Behavior is cooperative.     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   Sleep apnea    Thrombosis, hepatic vein (HCC)    SMV   Thyroid disease    Vestibular migraine    Past Surgical History:  Procedure Laterality Date   ABDOMINAL HYSTERECTOMY  1987   BIOPSY  05/06/2023   Procedure: BIOPSY;  Surgeon: Lanelle Bal, DO;  Location: [facility];  Service: Endoscopy;;   COLON RESECTION     COLONOSCOPY     COLONOSCOPY WITH PROPOFOL N/A 05/06/2023   Procedure: COLONOSCOPY WITH PROPOFOL;  Surgeon: Lanelle Bal, DO;  Location: [facility];  Service: Endoscopy;  Laterality: N/A;   ESOPHAGOGASTRODUODENOSCOPY (EGD) WITH PROPOFOL N/A 05/06/2023   Procedure: ESOPHAGOGASTRODUODENOSCOPY (EGD) WITH PROPOFOL;  Surgeon: Lanelle Bal, DO;  Location: [facility];  Service: Endoscopy;  Laterality: N/A;   HOT HEMOSTASIS  05/06/2023   Procedure: HOT HEMOSTASIS (ARGON PLASMA COAGULATION/BICAP);  Surgeon: Lanelle Bal, DO;  Location: [facility];  Service: Endoscopy;;   POLYPECTOMY  05/06/2023   Procedure: POLYPECTOMY;  Surgeon: Lanelle Bal, DO;  Location: [facility];  Service: Endoscopy;;   PORTACATH PLACEMENT     Never removed placed at time of colon cancer  treatment   SHOULDER ARTHROSCOPY     left   TOTAL KNEE ARTHROPLASTY Left 06/08/2019   Procedure: TOTAL KNEE ARTHROPLASTY;  Surgeon: Ollen Gross, MD;  Location: [facility];  Service: Orthopedics;  Laterality: Left;    UPPER GASTROINTESTINAL ENDOSCOPY      SOCIAL HISTORY:  Social History   Socioeconomic History   Marital status: Widowed    Spouse name: Not on file   Number of children: 2   Years of education: Not on file   Highest education level: Not on file  Occupational History   Occupation: retired  Tobacco Use   Smoking status: Never   Smokeless tobacco: Never  Vaping Use   Vaping status: Never Used  Substance and Sexual Activity   Alcohol use: No   Drug use: No   Sexual activity: Not Currently  Other Topics  Concern   Not on file  Social History Narrative   Married, 1 son one daughter. Daughter is a Publishing rights manager.   She is retired   2 cups coffee per day   Social Determinants of Corporate investment banker Strain: Not on file  Food Insecurity: Not on file  Transportation Needs: Not on file  Physical Activity: Not on file  Stress: Not on file  Social Connections: Not on file  Intimate Partner Violence: Not on file    FAMILY HISTORY:  Family History  Problem Relation Age of Onset   Prostate cancer Brother    Lung cancer Brother    Heart disease Brother    Bone cancer Brother    Diabetes Mother    Alzheimer's disease Mother    Diabetes Brother    Heart attack Brother    Lung cancer Brother    Bone cancer Brother    Lung cancer Sister        with Estate manager/land agent    Ulcerative colitis Sister    Colon cancer Neg Hx    Esophageal cancer Neg Hx    Stomach cancer Neg Hx    Pancreatic cancer Neg Hx    AAA (abdominal aortic aneurysm) Neg Hx     CURRENT MEDICATIONS:  Outpatient Encounter Medications as of 06/06/2023  Medication Sig Note   ascorbic acid (VITAMIN C) 500 MG tablet Take 500 mg by mouth daily.    Biotin 1000 MCG tablet Take 1,000 mcg by mouth daily.    Cholecalciferol 50 MCG (2000 UT) TABS Take 2,000 Units by mouth daily.    diphenhydrAMINE-APAP, sleep, (TYLENOL PM EXTRA STRENGTH PO) Take 1 tablet by mouth as needed.    doxycycline (VIBRAMYCIN) 100 MG capsule Take 100 mg by mouth 2 (two) times daily.    empagliflozin (JARDIANCE) 10 MG TABS tablet Take 1 tablet (10 mg total) by mouth daily before breakfast.    ferrous sulfate 325 (65 FE) MG tablet Take 1 tablet (325 mg total) by mouth 2 (two) times daily with a meal.    furosemide (LASIX) 40 MG tablet Take 1 tablet (40 mg total) by mouth daily.    levothyroxine (SYNTHROID) 75 MCG tablet Take 75 mcg by mouth daily before breakfast.     lidocaine-prilocaine (EMLA) cream Apply 1 Application topically as needed. Apply to skin  over port 30 minutes before port flush lab draws    magnesium 30 MG tablet Take 30 mg by mouth at bedtime.    nadolol (CORGARD) 20 MG tablet Take 1 tablet by mouth once daily (Patient taking differently: Take 20 mg by mouth every evening.)  pantoprazole (PROTONIX) 40 MG tablet Take 1 tablet (40 mg total) by mouth 2 (two) times daily before a meal.    potassium chloride SA (KLOR-CON M) 20 MEQ tablet Take 1 tablet (20 mEq total) by mouth daily.    pramipexole (MIRAPEX) 0.75 MG tablet Take 0.75 mg by mouth every evening.    triamcinolone cream (KENALOG) 0.1 % Apply topically.    vitamin B-12 (CYANOCOBALAMIN) 1000 MCG tablet Take 1,000 mcg by mouth daily.    [DISCONTINUED] XARELTO 20 MG TABS tablet Take 1 tablet (20 mg total) by mouth daily. Restart on 05/09/23    albuterol (VENTOLIN HFA) 108 (90 Base) MCG/ACT inhaler Inhale 1 puff into the lungs as needed. (Patient not taking: Reported on 06/06/2023) 03/01/2023: On hand   lactulose (CHRONULAC) 10 GM/15ML solution Take 10 g by mouth as needed. (Patient not taking: Reported on 06/06/2023) 03/01/2023: On hand   Multiple Vitamins-Minerals (PRESERVISION AREDS PO) Take 1 tablet by mouth daily. (Patient not taking: Reported on 05/22/2023)    XARELTO 20 MG TABS tablet Take 1 tablet (20 mg total) by mouth daily. Restart on 05/09/23    No facility-administered encounter medications on file as of 06/06/2023.    ALLERGIES:  Allergies  Allergen Reactions   Latex Anaphylaxis   Codeine Palpitations    LABORATORY DATA:  I have reviewed the labs as listed.  CBC    Component Value Date/Time   WBC 4.5 05/29/2023 0849   RBC 4.46 05/29/2023 0849   HGB 11.8 (L) 05/29/2023 0849   HCT 38.4 05/29/2023 0849   PLT 144 (L) 05/29/2023 0849   MCV 86.1 05/29/2023 0849   MCH 26.5 05/29/2023 0849   MCHC 30.7 05/29/2023 0849   RDW 20.2 (H) 05/29/2023 0849   LYMPHSABS 0.6 (L) 05/04/2023 1336   MONOABS 0.2 05/04/2023 1336   EOSABS 0.2 05/04/2023 1336   BASOSABS 0.0  05/04/2023 1336      Latest Ref Rng & Units 05/29/2023    8:58 AM 05/06/2023    4:10 AM 05/05/2023    4:38 AM  CMP  Glucose 70 - 99 mg/dL 161  096  045   BUN 8 - 23 mg/dL 16  11  13    Creatinine 0.44 - 1.00 mg/dL 4.09  8.11  9.14   Sodium 135 - 145 mmol/L 132  132  134   Potassium 3.5 - 5.1 mmol/L 4.8  3.4  3.8   Chloride 98 - 111 mmol/L 99  96  99   CO2 22 - 32 mmol/L 26  28  24    Calcium 8.9 - 10.3 mg/dL 9.5  8.2  8.5   Total Protein 6.5 - 8.1 g/dL   5.8   Total Bilirubin 0.3 - 1.2 mg/dL   1.3   Alkaline Phos 38 - 126 U/L   57   AST 15 - 41 U/L   17   ALT 0 - 44 U/L   15     DIAGNOSTIC IMAGING:  I have independently reviewed the relevant imaging and discussed with the patient.   WRAP UP:  All questions were answered. The patient knows to call the clinic with any problems, questions or concerns.  Medical decision making: Moderate  Time spent on visit: I spent 20 minutes counseling the patient face to face. The total time spent in the appointment was 30 minutes and more than 50% was on counseling.  Carnella Guadalajara, PA-C  06/06/2023 1:46 PM

## 2023-06-06 ENCOUNTER — Inpatient Hospital Stay: Payer: MEDICARE | Admitting: Physician Assistant

## 2023-06-06 VITALS — BP 146/70 | HR 73 | Temp 97.5°F | Resp 18 | Wt 191.6 lb

## 2023-06-06 DIAGNOSIS — Z95828 Presence of other vascular implants and grafts: Secondary | ICD-10-CM | POA: Diagnosis not present

## 2023-06-06 DIAGNOSIS — K55069 Acute infarction of intestine, part and extent unspecified: Secondary | ICD-10-CM | POA: Diagnosis not present

## 2023-06-06 DIAGNOSIS — D500 Iron deficiency anemia secondary to blood loss (chronic): Secondary | ICD-10-CM

## 2023-06-06 MED ORDER — XARELTO 20 MG PO TABS
20.0000 mg | ORAL_TABLET | Freq: Every day | ORAL | 3 refills | Status: DC
Start: 2023-06-06 — End: 2023-08-13

## 2023-06-06 MED ORDER — LIDOCAINE-PRILOCAINE 2.5-2.5 % EX CREA
1.0000 | TOPICAL_CREAM | CUTANEOUS | 0 refills | Status: AC | PRN
Start: 2023-06-06 — End: ?

## 2023-06-06 NOTE — Patient Instructions (Signed)
[facility] **VISIT SUMMARY & IMPORTANT INSTRUCTIONS **   You were seen today by Rojelio Brenner PA-C for your follow-up visit.    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 Your blood and iron levels look much better! Since you are still at risk for ongoing blood loss, we will check your blood count (CBC) once a month. Please contact our office and your GI specialist if you notice any bright red blood in your bowel movements, maroon-colored stool, or black and tarry bowel movements. Seek immediate medical attention if you have worsening symptoms of anemia such as profound weakness, chest pain, lightheadedness, passing out, or difficulty breathing. Continue to take your over-the-counter iron supplement (ferrous sulfate 325 mg) once daily.  SMV THROMBUS: This blood clot in the veins of your abdomen may have been triggered by your underlying liver cirrhosis. Since your blood counts have improved, we will restart you on Xarelto. Pay close attention for any signs or symptoms of bleeding (see above).  FOLLOW-UP APPOINTMENT: Labs and office visit in 2 months  ** Thank you for trusting me with your healthcare!  I strive to provide all of my patients with quality care at each visit.  If you receive a survey for this visit, I would be so grateful to you for taking the time to provide feedback.  Thank you in advance!  ~ Cadyn Rodger                   Dr. Doreatha Massed   &   Rojelio Brenner, PA-C   - - - - - - - - - - - - - - - - - -    Thank you for choosing [facility] at [facility] to provide your oncology and hematology care.  To afford each patient quality time with our provider, please arrive at least 15 minutes before your scheduled appointment time.   If you have a lab appointment with the Cancer Center please come in thru the Main Entrance and check  in at the main information desk.  You need to re-schedule your appointment should you arrive 10 or more minutes late.  We strive to give you quality time with our providers, and arriving late affects you and other patients whose appointments are after yours.  Also, if you no show three or more times for appointments you may be dismissed from the clinic at the providers discretion.     Again, thank you for choosing [facility].  Our hope is that these requests will decrease the amount of time that you wait before being seen by our physicians.       _____________________________________________________________  Should you have questions after your visit to [facility], please contact our office at [phone] and follow the prompts.  Our office hours are 8:00 a.m. and 4:30 p.m. Monday - Friday.  Please note that voicemails left after 4:00 p.m. may not be returned until the following business day.  We are closed weekends and major holidays.  You do have access to a nurse 24-7, just call the main number to the clinic [phone] and do not press any options, hold on the line and a nurse will answer the phone.    For prescription refill requests, have your pharmacy contact our office and allow 72 hours.

## 2023-06-11 LAB — LAB REPORT - SCANNED
A1c: 6.2
EGFR: 73

## 2023-06-17 ENCOUNTER — Telehealth: Payer: Self-pay

## 2023-06-17 NOTE — Telephone Encounter (Signed)
Pt's daughter phoned and stated she wanted to know where you were in the findings of getting her mother seen by a Hepatologist. (She advised her mother has not been seen in the office but you had a extensive conversation with her in the hospital after you scoped her mother regarding it. She states you probably didn't remember because it was around the time your daughter was to be born. She is asking that if you would call her @ [phone]. Her name is Rosey Bath and she is a NP. Please advise

## 2023-06-27 NOTE — Progress Notes (Signed)
Created in error

## 2023-07-09 ENCOUNTER — Inpatient Hospital Stay: Payer: MEDICARE | Attending: Hematology

## 2023-07-09 DIAGNOSIS — D500 Iron deficiency anemia secondary to blood loss (chronic): Secondary | ICD-10-CM | POA: Diagnosis present

## 2023-07-09 LAB — CBC
HCT: 37.8 % (ref 36.0–46.0)
Hemoglobin: 12 g/dL (ref 12.0–15.0)
MCH: 28.2 pg (ref 26.0–34.0)
MCHC: 31.7 g/dL (ref 30.0–36.0)
MCV: 88.7 fL (ref 80.0–100.0)
Platelets: 103 10*3/uL — ABNORMAL LOW (ref 150–400)
RBC: 4.26 MIL/uL (ref 3.87–5.11)
RDW: 18.6 % — ABNORMAL HIGH (ref 11.5–15.5)
WBC: 3.5 10*3/uL — ABNORMAL LOW (ref 4.0–10.5)
nRBC: 0 % (ref 0.0–0.2)

## 2023-07-09 LAB — SAMPLE TO BLOOD BANK

## 2023-07-25 ENCOUNTER — Encounter: Payer: Self-pay | Admitting: Hematology

## 2023-07-29 ENCOUNTER — Ambulatory Visit: Payer: MEDICARE | Admitting: Nurse Practitioner

## 2023-08-07 ENCOUNTER — Inpatient Hospital Stay: Payer: MEDICARE | Attending: Hematology

## 2023-08-07 DIAGNOSIS — Z23 Encounter for immunization: Secondary | ICD-10-CM | POA: Insufficient documentation

## 2023-08-07 DIAGNOSIS — K55069 Acute infarction of intestine, part and extent unspecified: Secondary | ICD-10-CM

## 2023-08-07 DIAGNOSIS — D500 Iron deficiency anemia secondary to blood loss (chronic): Secondary | ICD-10-CM | POA: Insufficient documentation

## 2023-08-07 DIAGNOSIS — K3189 Other diseases of stomach and duodenum: Secondary | ICD-10-CM | POA: Diagnosis not present

## 2023-08-07 LAB — CBC WITH DIFFERENTIAL/PLATELET
Abs Immature Granulocytes: 0.01 10*3/uL (ref 0.00–0.07)
Basophils Absolute: 0 10*3/uL (ref 0.0–0.1)
Basophils Relative: 1 %
Eosinophils Absolute: 0.3 10*3/uL (ref 0.0–0.5)
Eosinophils Relative: 8 %
HCT: 38.5 % (ref 36.0–46.0)
Hemoglobin: 12.1 g/dL (ref 12.0–15.0)
Immature Granulocytes: 0 %
Lymphocytes Relative: 19 %
Lymphs Abs: 0.7 10*3/uL (ref 0.7–4.0)
MCH: 28.5 pg (ref 26.0–34.0)
MCHC: 31.4 g/dL (ref 30.0–36.0)
MCV: 90.8 fL (ref 80.0–100.0)
Monocytes Absolute: 0.3 10*3/uL (ref 0.1–1.0)
Monocytes Relative: 8 %
Neutro Abs: 2.2 10*3/uL (ref 1.7–7.7)
Neutrophils Relative %: 64 %
Platelets: 103 10*3/uL — ABNORMAL LOW (ref 150–400)
RBC: 4.24 MIL/uL (ref 3.87–5.11)
RDW: 15.7 % — ABNORMAL HIGH (ref 11.5–15.5)
WBC: 3.4 10*3/uL — ABNORMAL LOW (ref 4.0–10.5)
nRBC: 0 % (ref 0.0–0.2)

## 2023-08-07 LAB — SAMPLE TO BLOOD BANK

## 2023-08-07 LAB — IRON AND TIBC
Iron: 61 ug/dL (ref 28–170)
Saturation Ratios: 18 % (ref 10.4–31.8)
TIBC: 335 ug/dL (ref 250–450)
UIBC: 274 ug/dL

## 2023-08-07 LAB — FERRITIN: Ferritin: 25 ng/mL (ref 11–307)

## 2023-08-07 LAB — D-DIMER, QUANTITATIVE: D-Dimer, Quant: 0.42 ug{FEU}/mL (ref 0.00–0.50)

## 2023-08-12 NOTE — Progress Notes (Unsigned)
[facility] [address], [place] [address]   CLINIC:  Medical Oncology/Hematology  PCP:  Alinda Deem, MD [address] 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  She was last seen by Rojelio Brenner PA-C on 06/06/2023.  At today's visit, she reports feeling fair.  She did have some improved energy after IV iron, but is starting to feel fatigued again.  She still has some mild dyspnea on exertion, but this is much better than it was.   She has chronic restless legs, but reports improvement in her leg cramps.  She denies any pica, headaches, chest pain, lightheadedness, or syncope.  She has not noticed any melena, hematochezia or other signs of overt gastrointestinal bleeding.   She denies any associated nausea, vomiting, abdominal pain.  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    She has little to no energy and 100% appetite. She endorses that she is maintaining a stable weight.  ASSESSMENT & PLAN:  1.  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  B12 and folic acid normal. - NASH cirrhosis, low-grade esophageal and colonic/rectal varices. - Colonoscopy (05/06/2023): Rectal varices, diverticulosis in the sigmoid colon.  9 nonbleeding colonic angiodysplastic lesions, treated with APC. - EGD (05/06/2023): Grade 1 esophageal varices, portal hypertensive gastropathy, gastritis.  Multiple gastric polyps and normal duodenum. - Received Ferrlecit on 05/06/2023, 250 mg.  Monoferric (1 g) on 05/13/2023. - She is taking iron tablet daily.   - Most recent labs (08/07/2023): Hgb 12.1/MCV 90.8.   Ferritin 25, iron saturation 18%. - She denies any obvious rectal bleeding or melena. - PLAN: Recommend IV Monoferric x 1. - She remains at risk for ongoing GI bleeding, therefore we will continue close monitoring of CBC and iron levels. - Labs and RTC in 3 months   2.  Superior mesenteric vein thrombus: - MRI (12/10/2022): Eccentric filling defect in the SMV is persistent across many sequences suspicious for nonocclusive thrombus just below the splenic portal confluence. 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  No filling defects and portal vein branches. - She is also wearing Holter monitor for workup of atrial fibrillation/flutter, which could be further indication for anticoagulation. - D-dimer (08/07/2023) normal at 0.42 - PLAN: Continue Xarelto along with close monitoring of CBC/D. - We will treat with Xarelto until clot completely resolves.  (For any discontinuation of Xarelto, we will touch base with cardiology due to potential atrial fibrillation) -- Recheck CT venogram abd/pel in 3 months  3.  History of [facility] colon cancer: - Underwent surgical resection in 1988, received leucovorin and 5-FU for a year as part of clinical trial at [facility].  She is still has a functioning port since then. - PLAN: Port flush twice yearly. (Most recent on 05/13/2023.)  Prefers peripheral lab draw for routine labs. - No indication for further labs (CEA) or imaging at this time.  4.  Social/family history: - She lives by herself and is independent of  ADLs and IADLs.  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  Non-smoker.  No exposure to chemicals. - Her daughter Clarisa Fling is a hospitalist APP at [employer] in [place]. - Sister had metastatic kidney cancer and  brother had metastatic prostate cancer.  5.  Other issues - Recommended ongoing discussion with PCP regarding dry mouth, hoarseness, rash, and joint pain.  She may benefit from referral to rheumatologist, at PCP discretion.  PLAN SUMMARY: >> IV Monoferric x 1 >> CT venogram abdomen/pelvis in 3 months >> PORT FLUSH (not labs) in 3 months >> Labs in 3 months = CBC/D, ferritin, iron/TIBC, D-dimer >> OFFICE visit in 3 months     REVIEW OF SYSTEMS:   Review of Systems  Constitutional:  Positive for fatigue. Negative for appetite change, chills, diaphoresis, fever and unexpected weight change.  HENT:   Positive for voice change (hoarse). Negative for lump/mass and nosebleeds.   Eyes:  Negative for eye problems.  Respiratory:  Positive for cough (dry) and shortness of breath (mild, with exertion, improved). Negative for hemoptysis and wheezing.   Cardiovascular:  Negative for chest pain, leg swelling and palpitations.  Gastrointestinal:  Negative for abdominal pain, blood in stool, constipation, diarrhea, nausea and vomiting.  Genitourinary:  Negative for hematuria.   Skin:  Positive for itching and rash.  Neurological:  Negative for dizziness, headaches and light-headedness.  Hematological:  Does not bruise/bleed easily.     PHYSICAL EXAM:  ECOG PERFORMANCE STATUS: 1 - Symptomatic but completely ambulatory  Vitals:   08/13/23 0913  BP: (!) 122/58  Pulse: (!) 56  Resp: 18  Temp: 97.6 F (36.4 C)  SpO2: 100%    Filed Weights   08/13/23 0913  Weight: 192 lb 6.4 oz (87.3 kg)   Physical Exam Constitutional:      Appearance: Normal appearance. She is obese.  Cardiovascular:     Heart sounds: Normal heart sounds.  Pulmonary:     Breath sounds: Normal breath sounds.  Neurological:     General: No focal deficit present.     Mental Status: Mental status is at baseline.  Psychiatric:        Behavior: Behavior normal. Behavior is cooperative.    PAST MEDICAL/SURGICAL HISTORY:   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 10/09/2020   Pancreatic cysts 10/09/2020   Portal hypertension (HCC)-gastropathy and colopathy 10/09/2020   Sleep apnea    Thrombosis, hepatic vein (HCC)    SMV   Thyroid disease    Vestibular migraine    Past Surgical History:  Procedure Laterality Date   ABDOMINAL HYSTERECTOMY  1987   BIOPSY  05/06/2023   Procedure: BIOPSY;  Surgeon: Lanelle Bal, DO;  [facility]: [facility];  Service: Endoscopy;;   COLON RESECTION     COLONOSCOPY     COLONOSCOPY WITH PROPOFOL N/A 05/06/2023   Procedure: COLONOSCOPY WITH PROPOFOL;  Surgeon: Lanelle Bal, DO;  Location: [facility];  Service: Endoscopy;  Laterality: N/A;   ESOPHAGOGASTRODUODENOSCOPY (EGD) WITH PROPOFOL N/A 05/06/2023   Procedure: ESOPHAGOGASTRODUODENOSCOPY (EGD) WITH PROPOFOL;  Surgeon: Lanelle Bal, DO;  [facility]: [facility];  Service: Endoscopy;  Laterality: N/A;   HOT HEMOSTASIS  05/06/2023   Procedure: HOT HEMOSTASIS (ARGON PLASMA COAGULATION/BICAP);  Surgeon: Lanelle Bal, DO;  Location: [facility];  Service: Endoscopy;;   POLYPECTOMY  05/06/2023   Procedure: POLYPECTOMY;  Surgeon: Lanelle Bal, DO;  [facility]: [facility];  Service: Endoscopy;;   PORTACATH PLACEMENT     Never removed placed at time of colon cancer treatment   SHOULDER ARTHROSCOPY     left   TOTAL KNEE ARTHROPLASTY Left 06/08/2019   Procedure: TOTAL KNEE ARTHROPLASTY;  Surgeon: Ollen Gross, MD;  Location: [facility];  Service: Orthopedics;  Laterality: Left;    UPPER GASTROINTESTINAL ENDOSCOPY      SOCIAL HISTORY:  Social History   Socioeconomic History   Marital  status: Widowed    Spouse name: Not on file   Number of children: 2   Years of education: Not on file   Highest education level: Not on file  Occupational History   Occupation: retired  Tobacco Use   Smoking status: Never   Smokeless tobacco: Never  Vaping Use   Vaping status: Never Used  Substance and Sexual Activity   Alcohol use: No   Drug use: No   Sexual activity: Not Currently  Other Topics Concern   Not on file  Social History Narrative   Married, 1 son one daughter. Daughter is a Publishing rights manager.   She is retired   2 cups coffee per day   Social Determinants of Corporate investment banker Strain: Not on file  Food Insecurity: Not on file  Transportation Needs: Not on file  Physical Activity: Not on file  Stress: Not on file  Social Connections: Not on file  Intimate Partner Violence: Not on file    FAMILY HISTORY:  Family History  Problem Relation Age of Onset   Prostate cancer Brother    Lung cancer Brother    Heart disease Brother    Bone cancer Brother    Diabetes Mother    Alzheimer's disease Mother    Diabetes Brother    Heart attack Brother    Lung cancer Brother    Bone cancer Brother    Lung cancer Sister        with Estate manager/land agent    Ulcerative colitis Sister    Colon cancer Neg Hx    Esophageal cancer Neg Hx    Stomach cancer Neg Hx    Pancreatic cancer Neg Hx    AAA (abdominal aortic aneurysm) Neg Hx     CURRENT MEDICATIONS:  Outpatient Encounter Medications as of 08/13/2023  Medication Sig Note   albuterol (VENTOLIN HFA) 108 (90 Base) MCG/ACT inhaler Inhale 1 puff into the lungs as needed. 03/01/2023: On hand   ascorbic acid (VITAMIN C) 500 MG tablet Take 500 mg by mouth daily.    Biotin 1000 MCG tablet Take 1,000 mcg by mouth daily.    Cholecalciferol 50 MCG (2000 UT) TABS Take 2,000 Units by mouth daily.    diphenhydrAMINE-APAP, sleep, (TYLENOL PM EXTRA STRENGTH PO) Take 1 tablet by mouth as needed.    doxycycline (VIBRAMYCIN) 100 MG  capsule Take 100 mg by mouth 2 (two) times daily.    empagliflozin (JARDIANCE) 10 MG TABS tablet Take 1 tablet (10 mg total) by mouth daily before breakfast.    ferrous sulfate 325 (65 FE) MG tablet Take 1 tablet (325 mg total) by mouth 2 (two) times daily with a meal.    furosemide (LASIX) 40  MG tablet Take 1 tablet (40 mg total) by mouth daily.    hydrOXYzine (ATARAX) 25 MG tablet Take 25 mg by mouth 4 (four) times daily as needed.    lactulose (CHRONULAC) 10 GM/15ML solution Take 10 g by mouth as needed. 03/01/2023: On hand   levothyroxine (SYNTHROID) 75 MCG tablet Take 75 mcg by mouth daily before breakfast.     lidocaine-prilocaine (EMLA) cream Apply 1 Application topically as needed. Apply to skin over port 30 minutes before port flush lab draws    magnesium 30 MG tablet Take 30 mg by mouth at bedtime.    Multiple Vitamins-Minerals (PRESERVISION AREDS PO) Take 1 tablet by mouth daily.    nadolol (CORGARD) 20 MG tablet Take 1 tablet by mouth once daily (Patient taking differently: Take 20 mg by mouth every evening.)    pantoprazole (PROTONIX) 40 MG tablet Take 1 tablet (40 mg total) by mouth 2 (two) times daily before a meal.    potassium chloride SA (KLOR-CON M) 20 MEQ tablet Take 1 tablet (20 mEq total) by mouth daily.    pramipexole (MIRAPEX) 0.75 MG tablet Take 0.75 mg by mouth every evening.    triamcinolone cream (KENALOG) 0.1 % Apply topically.    vitamin B-12 (CYANOCOBALAMIN) 1000 MCG tablet Take 1,000 mcg by mouth daily.    [DISCONTINUED] XARELTO 20 MG TABS tablet Take 1 tablet (20 mg total) by mouth daily. Restart on 05/09/23    XARELTO 20 MG TABS tablet Take 1 tablet (20 mg total) by mouth daily. Restart on 05/09/23    [EXPIRED] influenza vaccine adjuvanted (FLUAD) injection 0.5 mL     No facility-administered encounter medications on file as of 08/13/2023.    ALLERGIES:  Allergies  Allergen Reactions   Latex Anaphylaxis   Codeine Palpitations    LABORATORY DATA:  I have  reviewed the labs as listed.  CBC    Component Value Date/Time   WBC 3.4 (L) 08/07/2023 1014   RBC 4.24 08/07/2023 1014   HGB 12.1 08/07/2023 1014   HCT 38.5 08/07/2023 1014   PLT 103 (L) 08/07/2023 1014   MCV 90.8 08/07/2023 1014   MCH 28.5 08/07/2023 1014   MCHC 31.4 08/07/2023 1014   RDW 15.7 (H) 08/07/2023 1014   LYMPHSABS 0.7 08/07/2023 1014   MONOABS 0.3 08/07/2023 1014   EOSABS 0.3 08/07/2023 1014   BASOSABS 0.0 08/07/2023 1014      Latest Ref Rng & Units 05/29/2023    8:58 AM 05/06/2023    4:10 AM 05/05/2023    4:38 AM  CMP  Glucose 70 - 99 mg/dL 161  096  045   BUN 8 - 23 mg/dL 16  11  13    Creatinine 0.44 - 1.00 mg/dL 4.09  8.11  9.14   Sodium 135 - 145 mmol/L 132  132  134   Potassium 3.5 - 5.1 mmol/L 4.8  3.4  3.8   Chloride 98 - 111 mmol/L 99  96  99   CO2 22 - 32 mmol/L 26  28  24    Calcium 8.9 - 10.3 mg/dL 9.5  8.2  8.5   Total Protein 6.5 - 8.1 g/dL   5.8   Total Bilirubin 0.3 - 1.2 mg/dL   1.3   Alkaline Phos 38 - 126 U/L   57   AST 15 - 41 U/L   17   ALT 0 - 44 U/L   15     DIAGNOSTIC IMAGING:  I have independently reviewed the relevant  imaging and discussed with the patient.   WRAP UP:  All questions were answered. The patient knows to call the clinic with any problems, questions or concerns.  Medical decision making: Moderate  Time spent on visit: I spent 20 minutes counseling the patient face to face. The total time spent in the appointment was 30 minutes and more than 50% was on counseling.  Carnella Guadalajara, PA-C  08/13/23 10:10 AM

## 2023-08-13 ENCOUNTER — Inpatient Hospital Stay (HOSPITAL_BASED_OUTPATIENT_CLINIC_OR_DEPARTMENT_OTHER): Payer: MEDICARE | Admitting: Physician Assistant

## 2023-08-13 ENCOUNTER — Inpatient Hospital Stay: Payer: MEDICARE

## 2023-08-13 ENCOUNTER — Encounter: Payer: Self-pay | Admitting: Physician Assistant

## 2023-08-13 VITALS — BP 122/58 | HR 56 | Temp 97.6°F | Resp 18 | Wt 192.4 lb

## 2023-08-13 DIAGNOSIS — D500 Iron deficiency anemia secondary to blood loss (chronic): Secondary | ICD-10-CM | POA: Diagnosis not present

## 2023-08-13 DIAGNOSIS — K55069 Acute infarction of intestine, part and extent unspecified: Secondary | ICD-10-CM

## 2023-08-13 DIAGNOSIS — Z23 Encounter for immunization: Secondary | ICD-10-CM

## 2023-08-13 MED ORDER — INFLUENZA VAC A&B SURF ANT ADJ 0.5 ML IM SUSY
0.5000 mL | PREFILLED_SYRINGE | Freq: Once | INTRAMUSCULAR | Status: AC
  Administered 2023-08-13: 0.5 mL via INTRAMUSCULAR
  Filled 2023-08-13: qty 0.5

## 2023-08-13 MED ORDER — XARELTO 20 MG PO TABS
20.0000 mg | ORAL_TABLET | Freq: Every day | ORAL | 3 refills | Status: DC
Start: 2023-08-13 — End: 2024-08-25

## 2023-08-13 NOTE — Progress Notes (Signed)
Flu vaccine given in left deltoid without incident.  Site DCI and patient tolerated well.  Discharged ambulatory in stable condition.

## 2023-08-13 NOTE — Patient Instructions (Signed)
[facility] at [facility] **VISIT SUMMARY & IMPORTANT INSTRUCTIONS **   You were seen today by Rojelio Brenner PA-C for your follow-up visit.    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 Your blood levels look much better! Your iron levels have dropped, which is likely due to slow blood loss from your stomach. We will schedule you for IV iron x 1 dose. Please contact our office and your GI specialist if you notice any bright red blood in your bowel movements, maroon-colored stool, or black and tarry bowel movements. Seek immediate medical attention if you have worsening symptoms of anemia such as profound weakness, chest pain, lightheadedness, passing out, or difficulty breathing. Continue to take your over-the-counter iron supplement (ferrous sulfate 325 mg) once daily.  SMV THROMBUS: This blood clot in the veins of your abdomen may have been triggered by your underlying liver cirrhosis. Continue your Xarelto. Pay close attention for any signs or symptoms of bleeding (see above). We will check a CT scan of your abdomen in 3 months.  FOLLOW-UP APPOINTMENT: Labs and office visit in 3 months  ** Thank you for trusting me with your healthcare!  I strive to provide all of my patients with quality care at each visit.  If you receive a survey for this visit, I would be so grateful to you for taking the time to provide feedback.  Thank you in advance!  ~ Jahmal Dunavant                   Dr. Doreatha Massed   &   Rojelio Brenner, PA-C   - - - - - - - - - - - - - - - - - -    Thank you for choosing [facility] at [facility] to provide your oncology and hematology care.  To afford each patient quality time with our provider, please arrive at least 15 minutes before your scheduled appointment time.   If you have a lab appointment with the Cancer Center please come in thru  the Main Entrance and check in at the main information desk.  You need to re-schedule your appointment should you arrive 10 or more minutes late.  We strive to give you quality time with our providers, and arriving late affects you and other patients whose appointments are after yours.  Also, if you no show three or more times for appointments you may be dismissed from the clinic at the providers discretion.     Again, thank you for choosing [facility].  Our hope is that these requests will decrease the amount of time that you wait before being seen by our physicians.       _____________________________________________________________  Should you have questions after your visit to [facility], please contact our office at [phone] and follow the prompts.  Our office hours are 8:00 a.m. and 4:30 p.m. Monday - Friday.  Please note that voicemails left after 4:00 p.m. may not be returned until the following business day.  We are closed weekends and major holidays.  You do have access to a nurse 24-7, just call the main number to the clinic [phone] and do not press any options, hold on the line and a nurse will answer the phone.    For prescription refill requests, have your pharmacy contact our office and allow 72 hours.

## 2023-08-15 ENCOUNTER — Inpatient Hospital Stay: Payer: MEDICARE

## 2023-08-15 VITALS — BP 121/60 | HR 59 | Temp 97.5°F | Resp 18

## 2023-08-15 DIAGNOSIS — D500 Iron deficiency anemia secondary to blood loss (chronic): Secondary | ICD-10-CM | POA: Diagnosis not present

## 2023-08-15 DIAGNOSIS — D509 Iron deficiency anemia, unspecified: Secondary | ICD-10-CM

## 2023-08-15 MED ORDER — SODIUM CHLORIDE 0.9 % IV SOLN
1000.0000 mg | Freq: Once | INTRAVENOUS | Status: AC
  Administered 2023-08-15: 1000 mg via INTRAVENOUS
  Filled 2023-08-15: qty 1000

## 2023-08-15 MED ORDER — SODIUM CHLORIDE 0.9 % IV SOLN: Freq: Once | INTRAVENOUS | Status: AC

## 2023-08-15 MED ORDER — ACETAMINOPHEN 325 MG PO TABS
650.0000 mg | ORAL_TABLET | Freq: Once | ORAL | Status: AC
  Administered 2023-08-15: 650 mg via ORAL
  Filled 2023-08-15: qty 2

## 2023-08-15 MED ORDER — CETIRIZINE HCL 10 MG PO TABS
10.0000 mg | ORAL_TABLET | Freq: Once | ORAL | Status: AC
  Administered 2023-08-15: 10 mg via ORAL
  Filled 2023-08-15: qty 1

## 2023-08-15 NOTE — Progress Notes (Signed)
Venofer given today per MD orders. Tolerated infusion without adverse affects. Vital signs stable. No complaints at this time. Discharged from clinic ambulatory in stable condition. Alert and oriented x 3. F/U with [facility] as scheduled.

## 2023-08-15 NOTE — Progress Notes (Signed)
Patient presents today for iron infusion.  Patient is in satisfactory condition with no new complaints voiced.  Vital signs are stable.  IV placed in R arm.  IV flushed well with good blood return noted.  We will proceed with infusion per provider orders.    Patient tolerated infusion well with no complaints voiced.  Patient left ambulatory in stable condition.  Vital signs stable at discharge.  Follow up as scheduled.

## 2023-08-15 NOTE — Patient Instructions (Signed)
[facility]  Discharge Instructions: Thank you for choosing [facility] to provide your oncology and hematology care.  If you have a lab appointment with the Cancer Center - please note that after April 8th, 2024, all labs will be drawn in the cancer center.  You do not have to check in or register with the main entrance as you have in the past but will complete your check-in in the cancer center.  Wear comfortable clothing and clothing appropriate for easy access to any Portacath or PICC line.   We strive to give you quality time with your provider. You may need to reschedule your appointment if you arrive late (15 or more minutes).  Arriving late affects you and other patients whose appointments are after yours.  Also, if you miss three or more appointments without notifying the office, you may be dismissed from the clinic at the provider's discretion.      For prescription refill requests, have your pharmacy contact our office and allow 72 hours for refills to be completed.    Today you received the following Monoferric.  Ferric Derisomaltose Injection What is this medication? FERRIC DERISOMALTOSE (FER ik der EYE soe MAWL tose) treats low levels of iron in your body (iron deficiency anemia). Iron is a mineral that plays an important role in making red blood cells, which carry oxygen from your lungs to the rest of your body. This medicine may be used for other purposes; ask your health care provider or pharmacist if you have questions. COMMON BRAND NAME(S): MONOFERRIC What should I tell my care team before I take this medication? They need to know if you have any of these conditions: High levels of iron in the blood An unusual or allergic reaction to iron, other medications, foods, dyes, or preservatives Pregnant or trying to get pregnant Breastfeeding How should I use this medication? This medication is injected into a vein. It is given by your care team  in a hospital or clinic setting. Talk to your care team about the use of this medication in children. Special care may be needed. Overdosage: If you think you have taken too much of this medicine contact a poison control center or emergency room at once. NOTE: This medicine is only for you. Do not share this medicine with others. What if I miss a dose? It is important not to miss your dose. Call your care team if you are unable to keep an appointment. What may interact with this medication? Do not take this medication with any of the following: Deferoxamine Dimercaprol Other iron products This list may not describe all possible interactions. Give your health care provider a list of all the medicines, herbs, non-prescription drugs, or dietary supplements you use. Also tell them if you smoke, drink alcohol, or use illegal drugs. Some items may interact with your medicine. What should I watch for while using this medication? Visit your care team regularly. Tell your care team if your symptoms do not start to get better or if they get worse. You may need blood work done while you are taking this medication. You may need to follow a special diet. Talk to your care team. Foods that contain iron include whole grains/cereals, dried fruits, beans, or peas, leafy green vegetables, and organ meats (liver, kidney). What side effects may I notice from receiving this medication? Side effects that you should report to your care team as soon as possible: Allergic reactions--skin rash, itching, hives,  swelling of the face, lips, tongue, or throat Low blood pressure--dizziness, feeling faint or lightheaded, blurry vision Shortness of breath Side effects that usually do not require medical attention (report to your care team if they continue or are bothersome): Flushing Headache Joint pain Muscle pain Nausea Pain, redness, or irritation at injection site This list may not describe all possible side effects.  Call your doctor for medical advice about side effects. You may report side effects to FDA at 1-800-FDA-1088. Where should I keep my medication? This medication is given in a hospital or clinic. It will not be stored at home. NOTE: This sheet is a summary. It may not cover all possible information. If you have questions about this medicine, talk to your doctor, pharmacist, or health care provider.  2024 Elsevier/Gold Standard (2023-04-12 00:00:00)    To help prevent nausea and vomiting after your treatment, we encourage you to take your nausea medication as directed.  BELOW ARE SYMPTOMS THAT SHOULD BE REPORTED IMMEDIATELY: *FEVER GREATER THAN 100.4 F (38 C) OR HIGHER *CHILLS OR SWEATING *NAUSEA AND VOMITING THAT IS NOT CONTROLLED WITH YOUR NAUSEA MEDICATION *UNUSUAL SHORTNESS OF BREATH *UNUSUAL BRUISING OR BLEEDING *URINARY PROBLEMS (pain or burning when urinating, or frequent urination) *BOWEL PROBLEMS (unusual diarrhea, constipation, pain near the anus) TENDERNESS IN MOUTH AND THROAT WITH OR WITHOUT PRESENCE OF ULCERS (sore throat, sores in mouth, or a toothache) UNUSUAL RASH, SWELLING OR PAIN  UNUSUAL VAGINAL DISCHARGE OR ITCHING   Items with * indicate a potential emergency and should be followed up as soon as possible or go to the Emergency Department if any problems should occur.  Please show the CHEMOTHERAPY ALERT CARD or IMMUNOTHERAPY ALERT CARD at check-in to the Emergency Department and triage nurse.  Should you have questions after your visit or need to cancel or reschedule your appointment, please contact [facility] AT [facility] [phone]  and follow the prompts.  Office hours are 8:00 a.m. to 4:30 p.m. Monday - Friday. Please note that voicemails left after 4:00 p.m. may not be returned until the following business day.  We are closed weekends and major holidays. You have access to a nurse at all times for urgent questions. Please call the main number to the  clinic [phone] and follow the prompts.  For any non-urgent questions, you may also contact your provider using MyChart. We now offer e-Visits for anyone 18 and older to request care online for non-urgent symptoms. For details visit [email or web address].   Also download the MyChart app! Go to the app store, search "MyChart", open the app, select [place], and log in with your MyChart username and password.

## 2023-08-26 ENCOUNTER — Ambulatory Visit: Payer: MEDICARE | Attending: Nurse Practitioner | Admitting: Nurse Practitioner

## 2023-08-26 ENCOUNTER — Encounter: Payer: Self-pay | Admitting: Nurse Practitioner

## 2023-08-26 VITALS — BP 130/73 | HR 52 | Ht 65.0 in | Wt 191.0 lb

## 2023-08-26 DIAGNOSIS — D500 Iron deficiency anemia secondary to blood loss (chronic): Secondary | ICD-10-CM | POA: Insufficient documentation

## 2023-08-26 DIAGNOSIS — I5032 Chronic diastolic (congestive) heart failure: Secondary | ICD-10-CM | POA: Insufficient documentation

## 2023-08-26 DIAGNOSIS — K55069 Acute infarction of intestine, part and extent unspecified: Secondary | ICD-10-CM | POA: Diagnosis present

## 2023-08-26 DIAGNOSIS — I4892 Unspecified atrial flutter: Secondary | ICD-10-CM | POA: Insufficient documentation

## 2023-08-26 DIAGNOSIS — I480 Paroxysmal atrial fibrillation: Secondary | ICD-10-CM | POA: Insufficient documentation

## 2023-08-26 DIAGNOSIS — G4733 Obstructive sleep apnea (adult) (pediatric): Secondary | ICD-10-CM | POA: Insufficient documentation

## 2023-08-26 DIAGNOSIS — R001 Bradycardia, unspecified: Secondary | ICD-10-CM | POA: Insufficient documentation

## 2023-08-26 NOTE — Patient Instructions (Signed)
Medication Instructions:  Your physician recommends that you continue on your current medications as directed. Please refer to the Current Medication list given to you today.  *If you need a refill on your cardiac medications before your next appointment, please call your pharmacy*   Lab Work: NONE   If you have labs (blood work) drawn today and your tests are completely normal, you will receive your results only by: MyChart Message (if you have MyChart) OR A paper copy in the mail If you have any lab test that is abnormal or we need to change your treatment, we will call you to review the results.   Testing/Procedures: NONE    Follow-Up: At [facility], you and your health needs are our priority.  As part of our continuing mission to provide you with exceptional heart care, we have created designated Provider Care Teams.  These Care Teams include your primary Cardiologist (physician) and Advanced Practice Providers (APPs -  Physician Assistants and Nurse Practitioners) who all work together to provide you with the care you need, when you need it.  We recommend signing up for the patient portal called "MyChart".  Sign up information is provided on this After Visit Summary.  MyChart is used to connect with patients for Virtual Visits (Telemedicine).  Patients are able to view lab/test results, encounter notes, upcoming appointments, etc.  Non-urgent messages can be sent to your provider as well.   To learn more about what you can do with MyChart, go to [email or web address].    Your next appointment:   3 month(s)  Provider:   Sharlene Dory, NP  Other Instructions Thank you for choosing [facility]!

## 2023-08-26 NOTE — Progress Notes (Signed)
Cardiology Office Note:  .   Date:  08/26/2023  ID:  Cristina Santos, DOB May 24, 1941, MRN [id number] PCP: Alinda Deem, MD  [facility] Providers Cardiologist:  Dina Rich, MD    History of Present Illness: .   Cristina Santos is a 82 y.o. female with a PMH of HFpEF, chest pain/DOE, hx of NASH, liver cirrhosis, hx of blood loss anemia, iron deficiency (sees Heme/Onc), OSA, thrombocytopenia, and hx of superior mesenteric vein thrombosis, who presents to the office today for 6 week follow-up.   Last seen by Dr. Dina Rich on Apr 10, 2023. Evaluated for CP/DOE. Echocardiogram report noted below from 03/2023. Noted some signs of volume overload on exam, started Lasix 20 mg daily. NST was declined at the time by patient.   Hospitalized 04/2023 for acute on chronic HFpEF and hemoccult positive. Echo revealed normal EF. Received IV Lasix. Hospital course noted by PAF/A-flutter on EKG, wore Zio monitor at time of d/c. Xarelto held in setting of blood loss anemia. Underwent EGD/colonoscopy - see report below.   Today she presents for follow-up. Shows me her HR and BP log that shows stable blood pressure readings, heart rates overall well-controlled, 2 readings in mid to upper 40's for HR and some readings in 50's.  Patient says that she feels sleepy/tired when her heart rate does get this low.  Continues to get iron infusions.  Admits to one-time episode of sharp chest pain radiating from left side of her chest to right side of her chest, drink some soda water and symptoms went away within 1 hour, denies any triggers or exertional component. Denies any shortness of breath, palpitations, syncope, presyncope, dizziness, orthopnea, PND, swelling or significant weight changes, acute bleeding, or claudication.  SH: Daughter Aurther Loft works as NP in ED.   Studies Reviewed: .       Cardiac monitor 05/2023:   13 day monitor   Frequent supraventricular ectopy in the form of isolated PACs. Occasional  couplets, triplets. 118 episodes of SVT, the longest 6 beats.   Rare ventricular ectopy in the form of PVCs   No symptoms reported     Patch Wear Time:  13 days and 9 hours (2024-06-17T21:50:00-0400 to 2024-07-01T07:35:43-0400)   Patient had a min HR of 40 bpm, max HR of 148 bpm, and avg HR of 60 bpm. Predominant underlying rhythm was Sinus Rhythm. First Degree AV Block was present.118 Supraventricular Tachycardia runs occurred, the run with the fastest interval lasting 5 beats  with a max rate of 148 bpm, the longest lasting 6 beats with an avg rate of 93 bpm. Isolated SVEs were frequent (5.2%, 59340), SVE Couplets were occasional (3.4%, 19534), and SVE Triplets were occasional (1.8%, 6900). Isolated VEs were rare (<1.0%), and  no VE Couplets or VE Triplets were present.  05/06/23 colonoscopy--9 nonbleeding colon angioectasias tx'ed with APC; rectal varices; transverse and ascending colon polyps -05/06/23 EGD--grade 1 esophageal varices; portal HTN gastropathy, +gastric polyp  Limited Echo 04/2023:  1. Limited echo no color/doppler of valves done.   2. Left ventricular ejection fraction, by estimation, is 60 to 65%. The  left ventricle has normal function.   3. Left atrial size was mildly dilated.   4. The mitral valve is abnormal. Moderate mitral annular calcification.   5. The aortic valve is tricuspid. There is moderate calcification of the  aortic valve. There is moderate thickening of the aortic valve. Aortic  valve sclerosis/calcification is present, without any evidence of aortic  stenosis.  6. The inferior vena cava is normal in size with <50% respiratory  variability, suggesting right atrial pressure of 8 mmHg.  Risk Assessment/Calculations:    CHA2DS2-VASc Score = 4  This indicates a 4.8% annual risk of stroke. The patient's score is based upon: CHF History: 1 HTN History: 0 Diabetes History: 0 Stroke History: 0 Vascular Disease History: 0 Age Score: 2 Gender Score: 1    Physical Exam:   VS:  BP 130/73   Pulse (!) 52   Ht 5\' 5"  (1.651 m)   Wt 191 lb (86.6 kg)   BMI 31.78 kg/m    Wt Readings from Last 3 Encounters:  08/26/23 191 lb (86.6 kg)  08/13/23 192 lb 6.4 oz (87.3 kg)  06/06/23 191 lb 9.3 oz (86.9 kg)    GEN: Well nourished, well developed in no acute distress NECK: No JVD; No carotid bruits CARDIAC: S1/S2, slow rate and regular rhythm, no murmurs, rubs, gallops RESPIRATORY:  Clear to auscultation without rales, wheezing or rhonchi  ABDOMEN: Soft, non-tender, non-distended EXTREMITIES:  nonpitting pitting edema to BLE; No deformity   ASSESSMENT AND PLAN: .    Chronic HFpEF Stage C, NYHA class I-II symptoms. Weight is stable. EF 60-65%. No signs of volume overload on exam. Continue Jardiance, Lasix, nadolol, and potassium supplement. Low sodium diet, fluid restriction <2L, and daily weights encouraged. Educated to contact our office for weight gain of 2 lbs overnight or 5 lbs in one week.   PAF/A-flutter, bradycardia Denies any tachycardia or palpitations.  Past monitor report noted above.  Does note some bradycardia at times, patient does feel fatigued when heart rate is this low. Will consult attending cardiologist and GI team regarding nadolol.  Denies any recent bleeding while on Xarelto.  Continue Xarelto for stroke prevention and she is on correct Xarelto dosage. Heart healthy diet and regular cardiovascular exercise encouraged. Compliant with her CPAP usage.   Chronic SMV thrombosis Denies any symptoms. Continue xarelto and continue to follow-up with Hem/Onc.   Iron deficiency anemia d/t chronic blood loss Last Hgb stable at 12.1. Denies any bleeding issues. Has upcoming labs with Dr. Kirtland Bouchard. Continue to follow-up with Hem/Onc, receiving iron transfusions.   5. OSA on CPAP Encouraged continued compliance.  Dispo: Follow-up with me or APP in 3 months or sooner if anything changes.   Signed, Sharlene Dory, NP

## 2023-09-16 ENCOUNTER — Ambulatory Visit: Payer: MEDICARE | Admitting: Internal Medicine

## 2023-09-23 ENCOUNTER — Other Ambulatory Visit: Payer: Self-pay | Admitting: Nurse Practitioner

## 2023-09-23 DIAGNOSIS — I5032 Chronic diastolic (congestive) heart failure: Secondary | ICD-10-CM

## 2023-10-07 ENCOUNTER — Other Ambulatory Visit (INDEPENDENT_AMBULATORY_CARE_PROVIDER_SITE_OTHER): Payer: MEDICARE

## 2023-10-07 ENCOUNTER — Encounter: Payer: Self-pay | Admitting: Internal Medicine

## 2023-10-07 ENCOUNTER — Ambulatory Visit (INDEPENDENT_AMBULATORY_CARE_PROVIDER_SITE_OTHER): Payer: MEDICARE | Admitting: Internal Medicine

## 2023-10-07 VITALS — BP 104/70 | HR 54 | Ht 65.0 in | Wt 191.0 lb

## 2023-10-07 DIAGNOSIS — D500 Iron deficiency anemia secondary to blood loss (chronic): Secondary | ICD-10-CM | POA: Diagnosis not present

## 2023-10-07 DIAGNOSIS — K7581 Nonalcoholic steatohepatitis (NASH): Secondary | ICD-10-CM

## 2023-10-07 DIAGNOSIS — K766 Portal hypertension: Secondary | ICD-10-CM

## 2023-10-07 DIAGNOSIS — K7460 Unspecified cirrhosis of liver: Secondary | ICD-10-CM

## 2023-10-07 DIAGNOSIS — I8510 Secondary esophageal varices without bleeding: Secondary | ICD-10-CM

## 2023-10-07 DIAGNOSIS — K55069 Acute infarction of intestine, part and extent unspecified: Secondary | ICD-10-CM

## 2023-10-07 LAB — COMPREHENSIVE METABOLIC PANEL
ALT: 15 U/L (ref 0–35)
AST: 20 U/L (ref 0–37)
Albumin: 4.3 g/dL (ref 3.5–5.2)
Alkaline Phosphatase: 86 U/L (ref 39–117)
BUN: 16 mg/dL (ref 6–23)
CO2: 31 meq/L (ref 19–32)
Calcium: 9.7 mg/dL (ref 8.4–10.5)
Chloride: 98 meq/L (ref 96–112)
Creatinine, Ser: 0.85 mg/dL (ref 0.40–1.20)
GFR: 63.06 mL/min (ref >60.00)
Glucose, Bld: 82 mg/dL (ref 70–99)
Potassium: 4.1 meq/L (ref 3.5–5.1)
Sodium: 134 meq/L — ABNORMAL LOW (ref 135–145)
Total Bilirubin: 1.2 mg/dL (ref 0.2–1.2)
Total Protein: 6.7 g/dL (ref 6.0–8.3)

## 2023-10-07 NOTE — Patient Instructions (Signed)
Your provider has requested that you go to the basement level for lab work before leaving today. Press "B" on the elevator. The lab is located at the first door on the left as you exit the elevator.  _______________________________________________________  If your blood pressure at your visit was 140/90 or greater, please contact your primary care physician to follow up on this.  _______________________________________________________  If you are age 84 or older, your body mass index should be between 23-30. Your Body mass index is 31.78 kg/m. If this is out of the aforementioned range listed, please consider follow up with your Primary Care Provider.  If you are age 41 or younger, your body mass index should be between 19-25. Your Body mass index is 31.78 kg/m. If this is out of the aformentioned range listed, please consider follow up with your Primary Care Provider.   ________________________________________________________  The [facility] [facility] providers would like to encourage you to use [facility] to communicate with providers for non-urgent requests or questions.  Due to long hold times on the telephone, sending your provider a message by [facility] may be a faster and more efficient way to get a response.  Please allow 48 business hours for a response.  Please remember that this is for non-urgent requests.  _______________________________________________________  I appreciate the opportunity to care for you. Stan Head, MD, [facility]

## 2023-10-07 NOTE — Progress Notes (Signed)
Cristina Santos 82 y.o. 03/23/1941 [id number]  Assessment & Plan:   Encounter Diagnoses  Name Primary?   Liver cirrhosis secondary to NASH (HCC) Yes   Portal hypertension (HCC)-gastropathy and colopathy     Superior mesenteric vein thrombosis (HCC)    Iron deficiency anemia due to chronic blood loss    Secondary esophageal varices without bleeding (HCC)    Quandra seems to be doing well overall with respect to her cirrhosis and associated conditions.  She gets cross-sectional imaging for her SMV thrombosis again soon and had a CT scan earlier this year so we are up-to-date with hepatocellular carcinoma screening with imaging.  No plans to follow her small pancreatic cysts at this point.  Labs as below.  Return in 6 months sooner as needed.  She will continue follow-up regarding iron deficiency anemia as well as the SMV thrombosis in the hematology clinic in [place]. Orders Placed This Encounter  Procedures   Comprehensive metabolic panel   AFP tumor marker    Subjective:  Gastroenterology summary  NASH cirrhosis with portal gastropathy, 1+ varices and portal colopathy, trace ascites HAV immune HBV vaccinated  Chronic blood loss anemia/iron deficiency anemia thought related to the gastropathy and colopathy in the setting of Eliquis therapy EGD and colonoscopy June 2024 at [facility] admission  05/06/23 colonoscopy--9 nonbleeding colon angioectasias tx'ed with APC; rectal varices; transverse and ascending colon polyps -05/06/23 EGD--grade 1 esophageal varices; portal HTN gastropathy, +gastric polyp A. STOMACH, BIOPSY:  - Oxyntic mucosa with mild chronic inflammation and few ectatic vessels  -H. pylori negative  B. COLON, ASCENDING, TRANSVERSE, POLYPECTOMY:   - Tubular adenoma (1 fragment)  - Hyperplastic polyp (1 fragment)  - Negative for high-grade dysplasia or malignancy   Remote history of colon cancer 1988   Superior mesenteric vein thrombosis diagnosed  February 2024-on Eliquis  Cystic lesions of the pancreas thought benign last imaged February 2024 MR, though not reported on June 2024 CT abdomen and pelvis  ----------------------------------------------------------------- Chief Complaint: Follow-up of cirrhosis  HPI Discussed the use of AI scribe software for clinical note transcription with the patient, who gave verbal consent to proceed.  History of Present Illness   The patient, with a history of cirrhosis, presents for a follow-up visit. Last seen in June after hospitalization due toiron-deficiency anemia She reports feeling well overall, with no new health concerns.  The patient has a history of SMV thrombosis, which was improving at the time of her last scan. She is scheduled for a follow-up CT scan at the end of December to monitor this. She expresses a desire to discontinue Xarelto, but acknowledges that it may be necessary for cardiac reasons. The patient's overall condition appears stable, with no new or worsening symptoms reported. She is compliant with her current medication regimen and is actively engaged in monitoring her health.       Wt Readings from Last 3 Encounters:  10/07/23 191 lb (86.6 kg)  08/26/23 191 lb (86.6 kg)  08/13/23 192 lb 6.4 oz (87.3 kg)     Heme-onc visit September 2024 cardiology October 2024 reviewed Allergies  Allergen Reactions   Latex Anaphylaxis   Codeine Palpitations   Current Meds  Medication Sig   albuterol (VENTOLIN HFA) 108 (90 Base) MCG/ACT inhaler Inhale 1 puff into the lungs as needed.   ascorbic acid (VITAMIN C) 500 MG tablet Take 500 mg by mouth daily.   Biotin 1000 MCG tablet Take 1,000 mcg by mouth daily.   Cholecalciferol 50  MCG (2000 UT) TABS Take 2,000 Units by mouth daily.   diphenhydrAMINE-APAP, sleep, (TYLENOL PM EXTRA STRENGTH PO) Take 1 tablet by mouth as needed.   empagliflozin (JARDIANCE) 10 MG TABS tablet TAKE 1 TABLET BY MOUTH ONCE DAILY BEFORE BREAKFAST    ferrous sulfate 325 (65 FE) MG tablet Take 1 tablet (325 mg total) by mouth 2 (two) times daily with a meal.   furosemide (LASIX) 40 MG tablet Take 1 tablet (40 mg total) by mouth daily.   hydrOXYzine (ATARAX) 25 MG tablet Take 25 mg by mouth 4 (four) times daily as needed.   levothyroxine (SYNTHROID) 75 MCG tablet Take 75 mcg by mouth daily before breakfast.    magnesium 30 MG tablet Take 30 mg by mouth at bedtime.   Multiple Vitamins-Minerals (PRESERVISION AREDS PO) Take 1 tablet by mouth daily.   nadolol (CORGARD) 20 MG tablet Take 1 tablet by mouth once daily (Patient taking differently: Take 20 mg by mouth every evening.)   pantoprazole (PROTONIX) 40 MG tablet Take 1 tablet (40 mg total) by mouth 2 (two) times daily before a meal.   potassium chloride SA (KLOR-CON M) 20 MEQ tablet Take 1 tablet (20 mEq total) by mouth daily.   pramipexole (MIRAPEX) 0.75 MG tablet Take 0.75 mg by mouth every evening.   triamcinolone cream (KENALOG) 0.1 % Apply topically.   vitamin B-12 (CYANOCOBALAMIN) 1000 MCG tablet Take 1,000 mcg by mouth daily.   XARELTO 20 MG TABS tablet Take 1 tablet (20 mg total) by mouth daily. Restart on 05/09/23   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 10/09/2020   Pancreatic cysts 10/09/2020   Portal hypertension (HCC)-gastropathy and colopathy 10/09/2020   Sleep apnea    Thrombosis of mesenteric vein (HCC) SMV    SMV   Thyroid disease    Vestibular migraine    Past Surgical History:  Procedure Laterality Date   ABDOMINAL HYSTERECTOMY  1987   BIOPSY  05/06/2023   Procedure: BIOPSY;  Surgeon: Lanelle Bal, DO;  Location: [facility];  Service:  Endoscopy;;   COLON RESECTION     COLONOSCOPY     COLONOSCOPY WITH PROPOFOL N/A 05/06/2023   Procedure: COLONOSCOPY WITH PROPOFOL;  Surgeon: Lanelle Bal, DO;  Location: [facility];  Service: Endoscopy;  Laterality: N/A;   ESOPHAGOGASTRODUODENOSCOPY (EGD) WITH PROPOFOL N/A 05/06/2023   Procedure: ESOPHAGOGASTRODUODENOSCOPY (EGD) WITH PROPOFOL;  Surgeon: Lanelle Bal, DO;  Location: [facility];  Service: Endoscopy;  Laterality: N/A;   HOT HEMOSTASIS  05/06/2023   Procedure: HOT HEMOSTASIS (ARGON PLASMA COAGULATION/BICAP);  Surgeon: Lanelle Bal, DO;  Location: [facility];  Service: Endoscopy;;   POLYPECTOMY  05/06/2023   Procedure: POLYPECTOMY;  Surgeon: Lanelle Bal, DO;  Location: [facility];  Service: Endoscopy;;   PORTACATH PLACEMENT     Never removed placed at time of colon cancer treatment   SHOULDER ARTHROSCOPY     left   TOTAL KNEE ARTHROPLASTY Left 06/08/2019   Procedure: TOTAL KNEE ARTHROPLASTY;  Surgeon: Ollen Gross, MD;  Location: [facility];  Service: Orthopedics;  Laterality: Left;    UPPER GASTROINTESTINAL ENDOSCOPY  Social History   Social History Narrative   Married, 1 son one daughter. Daughter is a Publishing rights manager.   She is retired   2 cups coffee per day   family history includes Alzheimer's disease in her mother; Bone cancer in her brother and brother; Diabetes in her brother and mother; Heart attack in her brother; Heart disease in her brother; Lung cancer in her brother, brother, and sister; Prostate cancer in her brother; Ulcerative colitis in her sister.   Review of Systems As above  Objective:   Physical Exam BP 104/70   Pulse (!) 54   Ht 5\' 5"  (1.651 m)   Wt 191 lb (86.6 kg)   BMI 31.78 kg/m  Physical Exam   CHEST: Lungs clear to auscultation. CARDIOVASCULAR: Heart sounds normal, S1, S2 without murmurs or gallops, occasional premature beats. ABDOMEN: Soft, nontender without hepatosplenomegaly or  masses. EXTREMITIES: Trace ankle edema. MUSCULOSKELETAL: Moderate kyphosis. SKIN: No spider angioma.     Alert and oriented x 3 and no asterixis

## 2023-10-08 ENCOUNTER — Telehealth: Payer: Self-pay | Admitting: Nurse Practitioner

## 2023-10-08 LAB — AFP TUMOR MARKER: AFP-Tumor Marker: 2.9 ng/mL

## 2023-10-08 NOTE — Telephone Encounter (Signed)
-----   Message from Sharlene Dory sent at 10/07/2023  4:59 PM EST ----- Please tell patient I have heard back from her cardiologist and GI. Both are okay to stop nadolol.   Thanks!   Best,  Sharlene Dory, NP

## 2023-10-08 NOTE — Telephone Encounter (Signed)
Nadilol stopped per peck

## 2023-10-22 ENCOUNTER — Encounter: Payer: Self-pay | Admitting: Internal Medicine

## 2023-10-23 ENCOUNTER — Other Ambulatory Visit: Payer: Self-pay | Admitting: Nurse Practitioner

## 2023-10-23 MED ORDER — NADOLOL 20 MG PO TABS: 10.0000 mg | ORAL_TABLET | Freq: Every day | ORAL | 1 refills | Status: DC

## 2023-11-19 ENCOUNTER — Inpatient Hospital Stay: Payer: MEDICARE | Attending: Hematology

## 2023-11-19 ENCOUNTER — Ambulatory Visit (HOSPITAL_COMMUNITY)
Admission: RE | Admit: 2023-11-19 | Discharge: 2023-11-19 | Disposition: A | Discharge status: Home / Self Care | Payer: MEDICARE | Source: Ambulatory Visit | Attending: Physician Assistant | Admitting: Physician Assistant

## 2023-11-19 DIAGNOSIS — K55059 Acute (reversible) ischemia of intestine, part and extent unspecified: Secondary | ICD-10-CM | POA: Diagnosis not present

## 2023-11-19 DIAGNOSIS — K55069 Acute infarction of intestine, part and extent unspecified: Secondary | ICD-10-CM | POA: Insufficient documentation

## 2023-11-19 DIAGNOSIS — D500 Iron deficiency anemia secondary to blood loss (chronic): Secondary | ICD-10-CM | POA: Diagnosis present

## 2023-11-19 LAB — IRON AND TIBC
Iron: 78 ug/dL (ref 28–170)
Saturation Ratios: 22 % (ref 10.4–31.8)
TIBC: 352 ug/dL (ref 250–450)
UIBC: 274 ug/dL

## 2023-11-19 LAB — CBC WITH DIFFERENTIAL/PLATELET
Abs Immature Granulocytes: 0.01 K/uL (ref 0.00–0.07)
Basophils Absolute: 0 K/uL (ref 0.0–0.1)
Basophils Relative: 1 %
Eosinophils Absolute: 0.3 K/uL (ref 0.0–0.5)
Eosinophils Relative: 8 %
HCT: 40 % (ref 36.0–46.0)
Hemoglobin: 12.1 g/dL (ref 12.0–15.0)
Immature Granulocytes: 0 %
Lymphocytes Relative: 18 %
Lymphs Abs: 0.7 K/uL (ref 0.7–4.0)
MCH: 27.9 pg (ref 26.0–34.0)
MCHC: 30.3 g/dL (ref 30.0–36.0)
MCV: 92.2 fL (ref 80.0–100.0)
Monocytes Absolute: 0.4 K/uL (ref 0.1–1.0)
Monocytes Relative: 9 %
Neutro Abs: 2.7 K/uL (ref 1.7–7.7)
Neutrophils Relative %: 64 %
Platelets: 127 K/uL — ABNORMAL LOW (ref 150–400)
RBC: 4.34 MIL/uL (ref 3.87–5.11)
RDW: 14.5 % (ref 11.5–15.5)
WBC: 4.2 K/uL (ref 4.0–10.5)
nRBC: 0 % (ref 0.0–0.2)

## 2023-11-19 LAB — POCT I-STAT CREATININE: Creatinine, Ser: 1 mg/dL (ref 0.44–1.00)

## 2023-11-19 LAB — D-DIMER, QUANTITATIVE: D-Dimer, Quant: 1 ug{FEU}/mL — ABNORMAL HIGH (ref 0.00–0.50)

## 2023-11-19 LAB — FERRITIN: Ferritin: 27 ng/mL (ref 11–307)

## 2023-11-19 MED ORDER — IOHEXOL 300 MG/ML  SOLN
100.0000 mL | Freq: Once | INTRAMUSCULAR | Status: AC | PRN
  Administered 2023-11-19: 100 mL via INTRAVENOUS

## 2023-11-22 ENCOUNTER — Ambulatory Visit: Payer: MEDICARE | Attending: Nurse Practitioner | Admitting: Nurse Practitioner

## 2023-11-22 ENCOUNTER — Encounter: Payer: Self-pay | Admitting: Nurse Practitioner

## 2023-11-22 VITALS — BP 120/72 | HR 68 | Ht 65.0 in | Wt 190.6 lb

## 2023-11-22 DIAGNOSIS — G4733 Obstructive sleep apnea (adult) (pediatric): Secondary | ICD-10-CM | POA: Insufficient documentation

## 2023-11-22 DIAGNOSIS — I480 Paroxysmal atrial fibrillation: Secondary | ICD-10-CM | POA: Insufficient documentation

## 2023-11-22 DIAGNOSIS — K55069 Acute infarction of intestine, part and extent unspecified: Secondary | ICD-10-CM | POA: Diagnosis present

## 2023-11-22 DIAGNOSIS — D500 Iron deficiency anemia secondary to blood loss (chronic): Secondary | ICD-10-CM | POA: Diagnosis present

## 2023-11-22 DIAGNOSIS — Z87898 Personal history of other specified conditions: Secondary | ICD-10-CM | POA: Insufficient documentation

## 2023-11-22 DIAGNOSIS — I4892 Unspecified atrial flutter: Secondary | ICD-10-CM | POA: Insufficient documentation

## 2023-11-22 DIAGNOSIS — I5032 Chronic diastolic (congestive) heart failure: Secondary | ICD-10-CM | POA: Diagnosis not present

## 2023-11-22 NOTE — Progress Notes (Signed)
 Cardiology Office Note:  .   Date:  11/22/2023 ID:  Rock Cristina Santos, DOB 1940-12-17, MRN [id number] PCP: Diedra Senior, MD  [facility] Providers Cardiologist:  Alvan Carrier, MD    History of Present Illness: .   Cristina Santos is a 83 y.o. female with a PMH of HFpEF, chest pain/DOE, hx of NASH, liver cirrhosis, hx of blood loss anemia, iron deficiency (sees Heme/Onc), OSA, thrombocytopenia, and hx of superior mesenteric vein thrombosis, who presents to the office today for follow-up.   Last seen by Dr. Carrier Alvan on Apr 10, 2023. Evaluated for CP/DOE. Echocardiogram report noted below from 03/2023. Noted some signs of volume overload on exam, started Lasix  20 mg daily. NST was declined at the time by patient.   Hospitalized 04/2023 for acute on chronic HFpEF and hemoccult positive. Echo revealed normal EF. Received IV Lasix . Hospital course noted by PAF/A-flutter on EKG, wore Zio monitor at time of d/c. Xarelto  held in setting of blood loss anemia. Underwent EGD/colonoscopy - see report below.   08/26/2023 - Today she presents for follow-up. Shows me her HR and BP log that shows stable blood pressure readings, heart rates overall well-controlled, 2 readings in mid to upper 40's for HR and some readings in 50's.  Patient says that she feels sleepy/tired when her heart rate does get this low.  Continues to get iron infusions.  Admits to one-time episode of sharp chest pain radiating from left side of her chest to right side of her chest, drink some soda water and symptoms went away within 1 hour, denies any triggers or exertional component. Denies any shortness of breath, palpitations, syncope, presyncope, dizziness, orthopnea, PND, swelling or significant weight changes, acute bleeding, or claudication.  Our office was contacted after office visit, noting tachycardia. Kardia reflected intermittent A-fib, was wanting to see if she could return to beta blocker. Nadolol  restarted at 10 mg  daily.   11/22/2023 - Today she presents for follow-up. Doing well. Denies any recent acute cardiac issues or complaints. Denies any chest pain, shortness of breath, palpitations, syncope, presyncope, dizziness, orthopnea, PND, swelling or significant weight changes, acute bleeding, or claudication.  SH: Daughter Jerel works as NP with experience ED, recently has been promoted.   Studies Reviewed: .       Cardiac monitor 05/2023:   13 day monitor   Frequent supraventricular ectopy in the form of isolated PACs. Occasional couplets, triplets. 118 episodes of SVT, the longest 6 beats.   Rare ventricular ectopy in the form of PVCs   No symptoms reported     Patch Wear Time:  13 days and 9 hours (2024-06-17T21:50:00-0400 to 2024-07-01T07:35:43-0400)   Patient had a min HR of 40 bpm, max HR of 148 bpm, and avg HR of 60 bpm. Predominant underlying rhythm was Sinus Rhythm. First Degree AV Block was present.118 Supraventricular Tachycardia runs occurred, the run with the fastest interval lasting 5 beats  with a max rate of 148 bpm, the longest lasting 6 beats with an avg rate of 93 bpm. Isolated SVEs were frequent (5.2%, 59340), SVE Couplets were occasional (3.4%, 19534), and SVE Triplets were occasional (1.8%, 6900). Isolated VEs were rare (<1.0%), and  no VE Couplets or VE Triplets were present.  05/06/23 colonoscopy--9 nonbleeding colon angioectasias tx'ed with APC; rectal varices; transverse and ascending colon polyps -05/06/23 EGD--grade 1 esophageal varices; portal HTN gastropathy, +gastric polyp  Limited Echo 04/2023:  1. Limited echo no color/doppler of valves done.   2. Left  ventricular ejection fraction, by estimation, is 60 to 65%. The  left ventricle has normal function.   3. Left atrial size was mildly dilated.   4. The mitral valve is abnormal. Moderate mitral annular calcification.   5. The aortic valve is tricuspid. There is moderate calcification of the  aortic valve. There is  moderate thickening of the aortic valve. Aortic  valve sclerosis/calcification is present, without any evidence of aortic  stenosis.   6. The inferior vena cava is normal in size with <50% respiratory  variability, suggesting right atrial pressure of 8 mmHg.  Risk Assessment/Calculations:    CHA2DS2-VASc Score = 4  This indicates a 4.8% annual risk of stroke. The patient's score is based upon: CHF History: 1 HTN History: 0 Diabetes History: 0 Stroke History: 0 Vascular Disease History: 0 Age Score: 2 Gender Score: 1   Physical Exam:   VS:  BP 120/72   Pulse 68   Ht 5' 5 (1.651 m)   Wt 190 lb 9.6 oz (86.5 kg)   SpO2 98%   BMI 31.72 kg/m    Wt Readings from Last 3 Encounters:  11/22/23 190 lb 9.6 oz (86.5 kg)  10/07/23 191 lb (86.6 kg)  08/26/23 191 lb (86.6 kg)    GEN: Well nourished, well developed in no acute distress NECK: No JVD; No carotid bruits CARDIAC: S1/S2, RRR, no murmurs, rubs, gallops RESPIRATORY:  Clear to auscultation without rales, wheezing or rhonchi  ABDOMEN: Soft, non-tender, non-distended EXTREMITIES:  nonpitting pitting edema to BLE; No deformity   ASSESSMENT AND PLAN: .    Chronic HFpEF Stage C, NYHA class I-II symptoms. Weight is stable. EF 60-65%. No signs of volume overload on exam. Continue current medication regimen. Low sodium diet, fluid restriction <2L, and daily weights encouraged. Educated to contact our office for weight gain of 2 lbs overnight or 5 lbs in one week.   PAF/A-flutter, hx of bradycardia Denies any tachycardia or palpitations.  HR is well controlled while on nadolol  10 mg daily, had bradycardia at 20 mg daily. Past monitor report noted above.  Denies any recent bleeding while on Xarelto .  Continue Xarelto  for stroke prevention and she is on correct Xarelto  dosage. Heart healthy diet and regular cardiovascular exercise encouraged. Compliant with her CPAP usage.   Chronic SMV thrombosis Denies any symptoms. Continue xarelto   and continue to follow-up with Hem/Onc and GI.   Iron deficiency anemia d/t chronic blood loss Last Hgb stable at 12.1. Denies any bleeding issues. Continue to follow-up with Hem/Onc, receiving iron transfusions.   5. OSA on CPAP Encouraged continued compliance.  Dispo: Follow-up with Dr. Alvan or APP in 6 months or sooner if anything changes.   Signed, Almarie Crate, NP

## 2023-11-22 NOTE — Patient Instructions (Addendum)

## 2023-11-25 NOTE — Progress Notes (Deleted)
 [facility] 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 83 y.o. 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  She was last seen by Pleasant Barefoot PA-C on 08/15/2023.  At today's visit, she reports feeling ***.  She did have some improved energy after IV iron in September 2024, but is starting to feel fatigued again.  *** *** She still has some mild dyspnea on exertion, but this is much better than it was. *** She has chronic restless legs, but reports improvement in her leg cramps. *** She denies any pica, headaches, chest pain, lightheadedness, or syncope. *** She has not noticed any melena, hematochezia or other signs of overt gastrointestinal bleeding.    *** She denies any associated nausea, vomiting, abdominal pain. *** She restarted Xarelto  for her SMV thrombus after her visit in July 2024. *** She continues to take daily iron tablet.  She has little to no*** energy and 100***% appetite. She endorses that she is maintaining a stable weight.  ASSESSMENT & PLAN:  1.  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  B12 and folic acid normal. - NASH cirrhosis, low-grade esophageal and colonic/rectal varices. - Colonoscopy (05/06/2023): Rectal varices, diverticulosis in the sigmoid colon.  9 nonbleeding colonic angiodysplastic lesions, treated with APC. - EGD (05/06/2023): Grade 1 esophageal varices, portal hypertensive gastropathy, gastritis.  Multiple gastric polyps and normal duodenum. - Most recent IV iron with Monoferric  1 g on 08/15/2023 - She  is taking iron tablet daily.  *** - Most recent labs (11/19/2023): Hgb 12.1/MCV 92.2.  Ferritin 27, iron saturation 22%. - She denies any obvious rectal bleeding or melena.*** - PLAN: Recommend IV Monoferric  x 1. - She remains at risk for ongoing GI bleeding, therefore we will continue close monitoring of CBC and iron levels. - Labs and RTC in 3 months *** phone visit ???***  2.  Superior mesenteric vein thrombus: - MRI (12/10/2022): Eccentric filling defect in the SMV is persistent across many sequences suspicious for nonocclusive thrombus just below the splenic portal confluence. 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  No filling defects and portal vein branches. - CT venogram abdomen/pelvis (11/19/2023): Resolution of prior SMV thrombus, now with only minor chronic wall thickening/scarring and no evidence of recurrent thrombus, stenosis, or occlusion.  She does have stigmata of clinically significant portal hypertension and cirrhosis.*** - She is also wearing Holter monitor for workup of atrial fibrillation/flutter, which could be further indication for anticoagulation. - Most recent D-dimer (11/19/2023) elevated at 1.00, but same day CT venogram was negative for SMV thrombus - PLAN: *** TBD: Possibly D/C Xarelto ??  Need to check with cardiology due to potential A-fib??  *** *** Will continue Xarelto  along with close monitoring of CBC/D.*** - We will treat with Xarelto  until clot completely resolves.***  (For any discontinuation of Xarelto , we will touch base with cardiology due to potential atrial fibrillation) -- Recheck CT venogram abd/pel in 3 months  3.  History of [facility] colon cancer: - Underwent surgical resection in 1988, received leucovorin and 5-FU for a year as part of clinical trial at [facility]  She is still has a functioning port since then. - PLAN: Port flush twice yearly.  Due for port flush TODAY (11/26/2023) - Prefers peripheral lab draw for routine labs. - No indication for further labs (CEA) or imaging at this time.  4.  Social/family history: - She lives by herself and is independent of ADLs and IADLs.  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  Non-smoker.  No exposure to chemicals. - Her daughter Jerel Hummer is a hospitalist APP at [employer] in [place]. - Sister had metastatic kidney cancer and brother had metastatic prostate cancer.  5.  Other issues*** - Recommended ongoing discussion with PCP regarding dry mouth, hoarseness, rash, and joint pain.  She may benefit from referral to rheumatologist, at PCP discretion.  PLAN SUMMARY: >> IV Monoferric  x 1 >> Labs in 3 months = CBC/D, ferritin, iron/TIBC >> OFFICE ***visit in 3 months     REVIEW OF SYSTEMS: ***  Review of Systems  Constitutional:  Positive for fatigue. Negative for appetite change, chills, diaphoresis, fever and unexpected weight change.  HENT:   Positive for voice change (hoarse). Negative for lump/mass and nosebleeds.   Eyes:  Negative for eye problems.  Respiratory:  Positive for cough (dry) and shortness of breath (mild, with exertion, improved). Negative for hemoptysis and wheezing.   Cardiovascular:  Negative for chest pain, leg swelling and palpitations.  Gastrointestinal:  Negative for abdominal pain, blood in stool, constipation, diarrhea, nausea and vomiting.  Genitourinary:  Negative for hematuria.   Skin:  Positive for itching and rash.  Neurological:  Negative for dizziness, headaches and light-headedness.  Hematological:  Does not bruise/bleed easily.     PHYSICAL EXAM:  ECOG PERFORMANCE STATUS: 1 - Symptomatic but completely ambulatory *** There were no vitals filed for this visit.   There were no vitals filed for this  visit.  Physical Exam Constitutional:      Appearance: Normal appearance. She is obese.  Cardiovascular:     Heart sounds: Normal heart sounds.  Pulmonary:     Breath sounds: Normal breath sounds.  Neurological:     General: No focal deficit present.     Mental Status: Mental status is at baseline.  Psychiatric:        Behavior: Behavior normal. Behavior is cooperative.    PAST MEDICAL/SURGICAL HISTORY:  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 10/09/2020   Pancreatic cysts 10/09/2020   Portal hypertension (HCC)-gastropathy and colopathy 10/09/2020   Sleep apnea    Thrombosis of mesenteric vein (HCC) SMV    SMV   Thyroid disease    Vestibular migraine    Past Surgical History:  Procedure Laterality Date   ABDOMINAL HYSTERECTOMY  1987   BIOPSY  05/06/2023   Procedure: BIOPSY;  Surgeon: Cindie Carlin POUR, DO;  Location: [facility];  Service: Endoscopy;;   COLON RESECTION     COLONOSCOPY     COLONOSCOPY WITH PROPOFOL  N/A 05/06/2023   Procedure:  COLONOSCOPY WITH PROPOFOL ;  Surgeon: Cindie Carlin POUR, DO;  Location: [facility];  Service: Endoscopy;  Laterality: N/A;   ESOPHAGOGASTRODUODENOSCOPY (EGD) WITH PROPOFOL  N/A 05/06/2023   Procedure: ESOPHAGOGASTRODUODENOSCOPY (EGD) WITH PROPOFOL ;  Surgeon: Cindie Carlin POUR, DO;  Location: [facility];  Service: Endoscopy;  Laterality: N/A;   HOT HEMOSTASIS  05/06/2023   Procedure: HOT HEMOSTASIS (ARGON PLASMA COAGULATION/BICAP);  Surgeon: Cindie Carlin POUR, DO;  Location: [facility];  Service: Endoscopy;;   POLYPECTOMY  05/06/2023   Procedure: POLYPECTOMY;  Surgeon: Cindie Carlin POUR, DO;  Location: [facility];   Service: Endoscopy;;   PORTACATH PLACEMENT     Never removed placed at time of colon cancer treatment   SHOULDER ARTHROSCOPY     left   TOTAL KNEE ARTHROPLASTY Left 06/08/2019   Procedure: TOTAL KNEE ARTHROPLASTY;  Surgeon: Melodi Lerner, MD;  Location: [facility];  Service: Orthopedics;  Laterality: Left;    UPPER GASTROINTESTINAL ENDOSCOPY      SOCIAL HISTORY:  Social History   Socioeconomic History   Marital status: Widowed    Spouse name: Not on file   Number of children: 2   Years of education: Not on file   Highest education level: Not on file  Occupational History   Occupation: retired  Tobacco Use   Smoking status: Never   Smokeless tobacco: Never  Vaping Use   Vaping status: Never Used  Substance and Sexual Activity   Alcohol use: No   Drug use: No   Sexual activity: Not Currently  Other Topics Concern   Not on file  Social History Narrative   Married, 1 son one daughter. Daughter is a publishing rights manager.   She is retired   2 cups coffee per day   Social Drivers of Corporate Investment Banker Strain: Not on [employer] Insecurity: Not on file  Transportation Needs: Not on file  Physical Activity: Not on file  Stress: Not on file  Social Connections: Not on file  Intimate Partner Violence: Not on file    FAMILY HISTORY:  Family History  Problem Relation Age of Onset   Prostate cancer Brother    Lung cancer Brother    Heart disease Brother    Bone cancer Brother    Diabetes Mother    Alzheimer's disease Mother    Diabetes Brother    Heart attack Brother    Lung cancer Brother    Bone cancer Brother    Lung cancer Sister        with estate manager/land agent    Ulcerative colitis Sister    Colon cancer Neg Hx    Esophageal cancer Neg Hx    Stomach cancer Neg Hx    Pancreatic cancer Neg Hx    AAA (abdominal aortic aneurysm) Neg Hx     CURRENT MEDICATIONS:  Outpatient Encounter Medications as of 11/26/2023  Medication Sig Note   albuterol  (VENTOLIN  HFA) 108 (90  Base) MCG/ACT inhaler Inhale 1 puff into the lungs as needed. 03/01/2023: On hand   ascorbic acid (VITAMIN C) 500 MG tablet Take 500 mg by mouth daily.    Biotin 1000 MCG tablet Take 1,000 mcg by mouth daily.    Cholecalciferol 50 MCG (2000 UT) TABS Take 2,000 Units by mouth daily.    diphenhydrAMINE -APAP, sleep, (TYLENOL  PM EXTRA STRENGTH PO) Take 1 tablet by mouth as needed.    empagliflozin  (JARDIANCE ) 10 MG TABS tablet TAKE 1 TABLET BY MOUTH ONCE DAILY BEFORE BREAKFAST  ferrous sulfate  325 (65 FE) MG tablet Take 1 tablet (325 mg total) by mouth 2 (two) times daily with a meal.    furosemide  (LASIX ) 40 MG tablet Take 1 tablet (40 mg total) by mouth daily.    hydrOXYzine (ATARAX) 25 MG tablet Take 25 mg by mouth 4 (four) times daily as needed.    lactulose  (CHRONULAC ) 10 GM/15ML solution Take 10 g by mouth as needed. 10/07/2023: As needed   levothyroxine  (SYNTHROID ) 75 MCG tablet Take 75 mcg by mouth daily before breakfast.     lidocaine -prilocaine  (EMLA ) cream Apply 1 Application topically as needed. Apply to skin over port 30 minutes before port flush lab draws 10/07/2023: As needed   magnesium  30 MG tablet Take 30 mg by mouth at bedtime.    Multiple Vitamins-Minerals (PRESERVISION AREDS PO) Take 1 tablet by mouth daily.    nadolol  (CORGARD ) 20 MG tablet Take 0.5 tablets (10 mg total) by mouth daily.    pantoprazole  (PROTONIX ) 40 MG tablet Take 1 tablet (40 mg total) by mouth 2 (two) times daily before a meal.    potassium chloride  SA (KLOR-CON  M) 20 MEQ tablet Take 1 tablet (20 mEq total) by mouth daily.    pramipexole  (MIRAPEX ) 0.75 MG tablet Take 0.75 mg by mouth every evening.    triamcinolone cream (KENALOG) 0.1 % Apply topically.    vitamin B-12 (CYANOCOBALAMIN) 1000 MCG tablet Take 1,000 mcg by mouth daily.    XARELTO  20 MG TABS tablet Take 1 tablet (20 mg total) by mouth daily. Restart on 05/09/23    No facility-administered encounter medications on file as of 11/26/2023.     ALLERGIES:  Allergies  Allergen Reactions   Latex Anaphylaxis   Codeine Palpitations    LABORATORY DATA:  I have reviewed the labs as listed.  CBC    Component Value Date/Time   WBC 4.2 11/19/2023 0859   RBC 4.34 11/19/2023 0859   HGB 12.1 11/19/2023 0859   HCT 40.0 11/19/2023 0859   PLT 127 (L) 11/19/2023 0859   MCV 92.2 11/19/2023 0859   MCH 27.9 11/19/2023 0859   MCHC 30.3 11/19/2023 0859   RDW 14.5 11/19/2023 0859   LYMPHSABS 0.7 11/19/2023 0859   MONOABS 0.4 11/19/2023 0859   EOSABS 0.3 11/19/2023 0859   BASOSABS 0.0 11/19/2023 0859      Latest Ref Rng & Units 11/19/2023   10:07 AM 10/07/2023    9:07 AM 05/29/2023    8:58 AM  CMP  Glucose 70 - 99 mg/dL  82  877   BUN 6 - 23 mg/dL  16  16   Creatinine 9.55 - 1.00 mg/dL 8.99  9.14  9.14   Sodium 135 - 145 mEq/L  134  132   Potassium 3.5 - 5.1 mEq/L  4.1  4.8   Chloride 96 - 112 mEq/L  98  99   CO2 19 - 32 mEq/L  31  26   Calcium 8.4 - 10.5 mg/dL  9.7  9.5   Total Protein 6.0 - 8.3 g/dL  6.7    Total Bilirubin 0.2 - 1.2 mg/dL  1.2    Alkaline Phos 39 - 117 U/L  86    AST 0 - 37 U/L  20    ALT 0 - 35 U/L  15      DIAGNOSTIC IMAGING:  I have independently reviewed the relevant imaging and discussed with the patient.   WRAP UP:  All questions were answered. The patient knows to call the  clinic with any problems, questions or concerns.  Medical decision making: Moderate  Time spent on visit: I spent 20 minutes counseling the patient face to face. The total time spent in the appointment was 30 minutes and more than 50% was on counseling.  Pleasant CHRISTELLA Barefoot, PA-C  ***

## 2023-11-26 ENCOUNTER — Inpatient Hospital Stay: Payer: MEDICARE

## 2023-11-26 ENCOUNTER — Inpatient Hospital Stay: Payer: MEDICARE | Admitting: Physician Assistant

## 2023-11-29 ENCOUNTER — Ambulatory Visit: Payer: MEDICARE | Admitting: Nurse Practitioner

## 2023-12-11 ENCOUNTER — Encounter: Payer: Self-pay | Admitting: Internal Medicine

## 2023-12-12 NOTE — Telephone Encounter (Signed)
Called & spoke with patient and she is having lower abdominal cramping & frequent loose stools that started 12/07/23. Last night she says she went to the bathroom at least 15 times, worse after meals. Denies any fever, n/v. She last took 2 imodium tablets this morning, but it only provides relief for a short time. In her message she stated stool was dark, however this has resolved after she stopped taking pepto. She's also on an iron pill. PCP recommended she get in touch with our office. Advised she drink plenty of fluids in the meantime & that I'd be back in touch once Dr. Leone Payor has reviewed. Last seen for OV 10/07/23.

## 2023-12-12 NOTE — Telephone Encounter (Signed)
I spoke to her also.  She has not had fever as you noted.  She has not moved her bowels since taking 2 Imodium tablets though she is only been on liquids and she thinks if she eats that she may.  This has been a persistent problem not necessarily worse.  I offered appointment today but she does not think she can make the trip to [place] from [place].  I suggested she ask her PCP if he would order a stool for C. difficile (no antibiotics recently but possible) and culture/pathogen panel.  She will do that.  She may take up to 8 Imodium a day as needed and I encouraged her to force fluids other than water.  She is intermittently taking her furosemide which is appropriate in this setting.  I asked her to update [place].  Obviously if she gets worse I recommended an ER or urgent care visit.

## 2023-12-18 NOTE — Progress Notes (Signed)
[facility] [address] [place], [place] [address]   CLINIC:  Medical Oncology/Hematology  PCP:  Alinda Deem, MD [address] 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 83 y.o. 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  She was last seen by Rojelio Brenner PA-C on 08/15/2023.  At today's visit, she reports feeling somewhat tired due to not sleeping well.  She had severe diarrhea last week, but this has resolved within the past 1 to 2 days.  Her GI provider was made aware of this.  She did have some improved energy after IV iron in September 2024, but is starting to feel fatigued again.   She still has some mild dyspnea on exertion, but this is better than it was.  She has chronic restless legs, but reports improvement in her leg cramps.  She denies any pica, headaches, chest pain, lightheadedness, or syncope.   She reports scant rectal bleeding last week, but otherwise has not noticed any melena, hematochezia or other signs of overt gastrointestinal bleeding.   She denies any associated nausea, vomiting, abdominal pain.  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  She continues to take daily iron tablet.  She has 90% energy and 100% appetite. She endorses that she is maintaining a stable weight.  ASSESSMENT & PLAN:  1.  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  B12 and folic acid normal. - NASH cirrhosis, low-grade esophageal and colonic/rectal varices. - Colonoscopy (05/06/2023): Rectal varices, diverticulosis in the sigmoid colon.  9 nonbleeding colonic angiodysplastic lesions, treated with APC. - EGD  (05/06/2023): Grade 1 esophageal varices, portal hypertensive gastropathy, gastritis.  Multiple gastric polyps and normal duodenum. - Most recent IV iron with Monoferric 1 g on 08/15/2023 - She is taking iron tablet daily.   - Most recent labs (11/19/2023): Hgb 12.1/MCV 92.2.  Ferritin 27, iron saturation 22%.  (Labs from PCP dated 12/18/2023 show drop in Hgb at 10.6, with ferritin 38 and iron saturation 15%) - She denies any obvious rectal bleeding or melena. - PLAN: Recommend IV Monoferric x 1. - She remains at risk for ongoing GI bleeding, therefore we will continue close monitoring of CBC and iron levels. - Labs and RTC in 3 months with phone visit   2.  Superior mesenteric vein thrombus: - MRI (12/10/2022): Eccentric filling defect in the SMV is persistent across many sequences suspicious for nonocclusive thrombus just below the splenic portal confluence. 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  No filling defects and portal vein branches. - CT venogram abdomen/pelvis (11/19/2023): Resolution of prior SMV thrombus, now with only minor chronic wall thickening/scarring and no evidence of recurrent thrombus, stenosis, or occlusion.  She does have stigmata of clinically significant portal hypertension and cirrhosis. - She is also wearing Holter monitor for workup of atrial fibrillation/flutter, which could be further indication for anticoagulation. - Most recent D-dimer (11/19/2023) elevated at 1.00, but same day CT venogram was negative for SMV thrombus - PLAN: From a hematology standpoint,  she no longer needs to take Xarelto (SMV thrombus has resolved).  However, we will touch base with cardiology since she may need to remain on Xarelto for her atrial fibrillation.  Patient instructed to continue Xarelto at this  time, until notified otherwise.  3.  History of [facility] colon cancer: - Underwent surgical resection in 1988, received leucovorin and 5-FU for a year as part of clinical trial at [facility].  She is still has a functioning port since then. - PLAN: Port flush twice yearly.  Due for port flush TODAY (11/26/2023) - Prefers peripheral lab draw for routine labs. - No indication for further labs (CEA) or imaging at this time.  4.  Social/family history: - She lives by herself and is independent of ADLs and IADLs.  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  Non-smoker.  No exposure to chemicals. - Her daughter Clarisa Fling is a hospitalist APP at [employer] in [place]. - Sister had metastatic kidney cancer and brother had metastatic prostate cancer.  PLAN SUMMARY: >> IV Monoferric x 1 >> Labs in 3 months = CBC/D, ferritin, iron/TIBC >> PHONE visit in 3 months (after labs)  >> PORT FLUSH every 6 months (next due August 2025)     REVIEW OF SYSTEMS:   Review of Systems  Constitutional:  Positive for fatigue (mild). Negative for appetite change, chills, diaphoresis, fever and unexpected weight change.  HENT:   Negative for lump/mass and nosebleeds.   Eyes:  Negative for eye problems.  Respiratory:  Positive for shortness of breath (with exertion). Negative for cough and hemoptysis.   Cardiovascular:  Negative for chest pain, leg swelling and palpitations.  Gastrointestinal:  Positive for constipation and diarrhea. Negative for abdominal pain, blood in stool, nausea and vomiting.  Genitourinary:  Negative for hematuria.   Skin: Negative.   Neurological:  Positive for dizziness. Negative for headaches and light-headedness.  Hematological:  Does not bruise/bleed easily.  Psychiatric/Behavioral:  Positive for sleep disturbance.      PHYSICAL EXAM:  ECOG PERFORMANCE STATUS: 1 - Symptomatic but completely ambulatory  Vitals:   12/23/23 1016   BP: (!) 148/65  Pulse: 64  Resp: 18  Temp: (!) 97.4 F (36.3 C)  SpO2: 100%   Filed Weights   12/23/23 1016  Weight: 192 lb 14.4 oz (87.5 kg)   Physical Exam Constitutional:      Appearance: Normal appearance. She is obese.  Cardiovascular:     Heart sounds: Normal heart sounds.  Pulmonary:     Breath sounds: Normal breath sounds.  Neurological:     General: No focal deficit present.     Mental Status: Mental status is at baseline.  Psychiatric:        Behavior: Behavior normal. Behavior is cooperative.     PAST MEDICAL/SURGICAL HISTORY:  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 10/09/2020   Pancreatic cysts 10/09/2020   Portal hypertension (HCC)-gastropathy and colopathy 10/09/2020   Sleep apnea    Thrombosis of mesenteric vein (HCC) SMV    SMV   Thyroid disease    Vestibular migraine    Past Surgical History:  Procedure  Laterality Date   ABDOMINAL HYSTERECTOMY  1987   BIOPSY  05/06/2023   Procedure: BIOPSY;  Surgeon: Lanelle Bal, DO;  [facility];  Service: Endoscopy;;   COLON RESECTION     COLONOSCOPY     COLONOSCOPY WITH PROPOFOL N/A 05/06/2023   Procedure: COLONOSCOPY WITH PROPOFOL;  Surgeon: Lanelle Bal, DO;  [facility];  Service: Endoscopy;  Laterality: N/A;   ESOPHAGOGASTRODUODENOSCOPY (EGD) WITH PROPOFOL N/A 05/06/2023   Procedure: ESOPHAGOGASTRODUODENOSCOPY (EGD) WITH PROPOFOL;  Surgeon: Lanelle Bal, DO;  [facility];  Service: Endoscopy;  Laterality: N/A;   HOT HEMOSTASIS  05/06/2023   Procedure: HOT HEMOSTASIS (ARGON PLASMA COAGULATION/BICAP);  Surgeon: Lanelle Bal, DO;  Location: [facility];  Service: Endoscopy;;   POLYPECTOMY  05/06/2023   Procedure: POLYPECTOMY;  Surgeon: Lanelle Bal, DO;  [facility];  Service: Endoscopy;;   PORTACATH PLACEMENT     Never removed placed at time of colon cancer treatment   SHOULDER ARTHROSCOPY     left   TOTAL KNEE ARTHROPLASTY Left 06/08/2019   Procedure: TOTAL KNEE ARTHROPLASTY;  Surgeon: Ollen Gross, MD;  [facility];  Service: Orthopedics;  Laterality: Left;    UPPER GASTROINTESTINAL ENDOSCOPY      SOCIAL HISTORY:  Social History   Socioeconomic History   Marital status: Widowed    Spouse name: Not on file   Number of children: 2   Years of education: Not on file   Highest education level: Not on file  Occupational History   Occupation: retired  Tobacco Use   Smoking status: Never   Smokeless tobacco: Never  Vaping Use   Vaping status: Never Used  Substance and Sexual Activity   Alcohol use: No   Drug use: No   Sexual activity: Not Currently  Other Topics Concern   Not on file  Social History Narrative   Married, 1 son one daughter. Daughter is a Publishing rights manager.   She is retired   2 cups coffee per day   Social Drivers of Corporate investment banker Strain: Not on [employer] Insecurity: Not on file  Transportation Needs: Not on file  Physical Activity: Not on file  Stress: Not on file  Social Connections: Not on file  Intimate Partner Violence: Not on file    FAMILY HISTORY:  Family History  Problem Relation Age of Onset   Prostate cancer Brother    Lung cancer Brother    Heart disease Brother    Bone cancer Brother    Diabetes Mother    Alzheimer's disease Mother    Diabetes Brother    Heart attack Brother    Lung cancer Brother    Bone cancer Brother    Lung cancer Sister        with Estate manager/land agent    Ulcerative colitis Sister    Colon cancer Neg Hx    Esophageal cancer Neg Hx    Stomach cancer Neg Hx    Pancreatic cancer Neg Hx    AAA (abdominal aortic  aneurysm) Neg Hx     CURRENT MEDICATIONS:  Outpatient Encounter Medications as of 12/23/2023  Medication Sig Note   albuterol (VENTOLIN HFA) 108 (90 Base) MCG/ACT inhaler Inhale 1 puff into the lungs as needed. 03/01/2023: On hand   ascorbic acid (VITAMIN C) 500 MG tablet Take 500 mg by mouth daily.    Biotin 1000 MCG tablet Take 1,000 mcg by mouth daily.  Cholecalciferol 50 MCG (2000 UT) TABS Take 2,000 Units by mouth daily.    diphenhydrAMINE-APAP, sleep, (TYLENOL PM EXTRA STRENGTH PO) Take 1 tablet by mouth as needed.    empagliflozin (JARDIANCE) 10 MG TABS tablet TAKE 1 TABLET BY MOUTH ONCE DAILY BEFORE BREAKFAST    ferrous sulfate 325 (65 FE) MG tablet Take 1 tablet (325 mg total) by mouth 2 (two) times daily with a meal.    furosemide (LASIX) 40 MG tablet Take 1 tablet (40 mg total) by mouth daily.    hydrOXYzine (ATARAX) 25 MG tablet Take 25 mg by mouth 4 (four) times daily as needed.    lactulose (CHRONULAC) 10 GM/15ML solution Take 10 g by mouth as needed. 10/07/2023: As needed   levothyroxine (SYNTHROID) 75 MCG tablet Take 75 mcg by mouth daily before breakfast.     lidocaine-prilocaine (EMLA) cream Apply 1 Application topically as needed. Apply to skin over port 30 minutes before port flush lab draws 10/07/2023: As needed   magnesium 30 MG tablet Take 30 mg by mouth at bedtime.    Multiple Vitamins-Minerals (PRESERVISION AREDS PO) Take 1 tablet by mouth daily.    nadolol (CORGARD) 20 MG tablet Take 0.5 tablets (10 mg total) by mouth daily.    pantoprazole (PROTONIX) 40 MG tablet Take 1 tablet (40 mg total) by mouth 2 (two) times daily before a meal.    potassium chloride SA (KLOR-CON M) 20 MEQ tablet Take 1 tablet (20 mEq total) by mouth daily.    pramipexole (MIRAPEX) 0.75 MG tablet Take 0.75 mg by mouth every evening.    triamcinolone cream (KENALOG) 0.1 % Apply topically.    vitamin B-12 (CYANOCOBALAMIN) 1000 MCG tablet Take 1,000 mcg by mouth daily.    XARELTO 20 MG TABS  tablet Take 1 tablet (20 mg total) by mouth daily. Restart on 05/09/23    No facility-administered encounter medications on file as of 12/23/2023.    ALLERGIES:  Allergies  Allergen Reactions   Latex Anaphylaxis   Codeine Palpitations    LABORATORY DATA:  I have reviewed the labs as listed.  CBC    Component Value Date/Time   WBC 4.2 11/19/2023 0859   RBC 4.34 11/19/2023 0859   HGB 12.1 11/19/2023 0859   HCT 40.0 11/19/2023 0859   PLT 127 (L) 11/19/2023 0859   MCV 92.2 11/19/2023 0859   MCH 27.9 11/19/2023 0859   MCHC 30.3 11/19/2023 0859   RDW 14.5 11/19/2023 0859   LYMPHSABS 0.7 11/19/2023 0859   MONOABS 0.4 11/19/2023 0859   EOSABS 0.3 11/19/2023 0859   BASOSABS 0.0 11/19/2023 0859      Latest Ref Rng & Units 11/19/2023   10:07 AM 10/07/2023    9:07 AM 05/29/2023    8:58 AM  CMP  Glucose 70 - 99 mg/dL  82  027   BUN 6 - 23 mg/dL  16  16   Creatinine 2.53 - 1.00 mg/dL 6.64  4.03  4.74   Sodium 135 - 145 mEq/L  134  132   Potassium 3.5 - 5.1 mEq/L  4.1  4.8   Chloride 96 - 112 mEq/L  98  99   CO2 19 - 32 mEq/L  31  26   Calcium 8.4 - 10.5 mg/dL  9.7  9.5   Total Protein 6.0 - 8.3 g/dL  6.7    Total Bilirubin 0.2 - 1.2 mg/dL  1.2    Alkaline Phos 39 - 117 U/L  86    AST 0 -  37 U/L  20    ALT 0 - 35 U/L  15      DIAGNOSTIC IMAGING:  I have independently reviewed the relevant imaging and discussed with the patient.   WRAP UP:  All questions were answered. The patient knows to call the clinic with any problems, questions or concerns.  Medical decision making: Moderate  Time spent on visit: I spent 20 minutes counseling the patient face to face. The total time spent in the appointment was 30 minutes and more than 50% was on counseling.  Carnella Guadalajara, PA-C  12/23/23 11:43 AM

## 2023-12-23 ENCOUNTER — Inpatient Hospital Stay: Payer: MEDICARE | Attending: Hematology | Admitting: Physician Assistant

## 2023-12-23 ENCOUNTER — Inpatient Hospital Stay: Payer: MEDICARE

## 2023-12-23 VITALS — BP 148/65 | HR 64 | Temp 97.4°F | Resp 18 | Wt 192.9 lb

## 2023-12-23 DIAGNOSIS — K55069 Acute infarction of intestine, part and extent unspecified: Secondary | ICD-10-CM | POA: Diagnosis not present

## 2023-12-23 DIAGNOSIS — Z86718 Personal history of other venous thrombosis and embolism: Secondary | ICD-10-CM | POA: Insufficient documentation

## 2023-12-23 DIAGNOSIS — Z95828 Presence of other vascular implants and grafts: Secondary | ICD-10-CM

## 2023-12-23 DIAGNOSIS — D500 Iron deficiency anemia secondary to blood loss (chronic): Secondary | ICD-10-CM | POA: Diagnosis not present

## 2023-12-23 MED ORDER — HEPARIN SOD (PORK) LOCK FLUSH 100 UNIT/ML IV SOLN
500.0000 [IU] | Freq: Once | INTRAVENOUS | Status: AC
  Administered 2023-12-23: 500 [IU] via INTRAVENOUS

## 2023-12-23 MED ORDER — SODIUM CHLORIDE 0.9% FLUSH
10.0000 mL | INTRAVENOUS | Status: DC | PRN
  Administered 2023-12-23: 10 mL via INTRAVENOUS

## 2023-12-23 NOTE — Patient Instructions (Signed)
[facility] at [facility] **VISIT SUMMARY & IMPORTANT INSTRUCTIONS **   You were seen today by Rojelio Brenner PA-C for your follow-up visit.    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 Your blood and iron levels are both mildly low. This is most likely due to slow/intermittent blood loss from your stomach or intestines. We will schedule you for IV iron x 1 dose. Seek immediate medical attention if you have worsening symptoms of anemia such as profound weakness, chest pain, lightheadedness, passing out, or difficulty breathing. Continue to take your over-the-counter iron supplement (ferrous sulfate 325 mg) once daily.  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  Your most recent CT scan showed that this blood clot has resolved! From hematology standpoint, you no longer need Xarelto for treatment of this blood clot. However, you should CONTINUE to take Xarelto for the time being, since you may need to be on blood thinner from your cardiologist (due to atrial fibrillation). Pay close attention for any signs or symptoms of bleeding (see above).  FOLLOW-UP APPOINTMENT: Labs and phone visit in 3 months  ** Thank you for trusting me with your healthcare!  I strive to provide all of my patients with quality care at each visit.  If you receive a survey for this visit, I would be so grateful to you for taking the time to provide feedback.  Thank you in advance!  ~ Almina Schul                   Dr. Doreatha Massed   &   Rojelio Brenner, PA-C   - - - - - - - - - - - - - - - - - -    Thank you for choosing [facility] at [facility] to provide your oncology and hematology care.  To afford each patient quality time with our provider, please arrive at least 15 minutes before your scheduled  appointment time.   If you have a lab appointment with the Cancer Center please come in thru the Main Entrance and check in at the main information desk.  You need to re-schedule your appointment should you arrive 10 or more minutes late.  We strive to give you quality time with our providers, and arriving late affects you and other patients whose appointments are after yours.  Also, if you no show three or more times for appointments you may be dismissed from the clinic at the providers discretion.     Again, thank you for choosing [facility].  Our hope is that these requests will decrease the amount of time that you wait before being seen by our physicians.       _____________________________________________________________  Should you have questions after your visit to [facility], please contact our office at [phone] and follow the prompts.  Our office hours are 8:00 a.m. and 4:30 p.m. Monday - Friday.  Please note that voicemails left after 4:00 p.m. may not be returned until the following business day.  We are closed weekends and major holidays.  You do have access to a nurse 24-7, just call the main number to the clinic [phone] and do not press any options, hold on the line and a nurse will answer the phone.    For prescription refill requests,  have your pharmacy contact our office and allow 72 hours.

## 2023-12-23 NOTE — Patient Instructions (Signed)
[facility] [place] - A DEPT OF [facility]  Discharge Instructions: Thank you for choosing [facility] to provide your oncology and hematology care.  If you have a lab appointment with the Cancer Center - please note that after April 8th, 2024, all labs will be drawn in the cancer center.  You do not have to check in or register with the main entrance as you have in the past but will complete your check-in in the cancer center.  Wear comfortable clothing and clothing appropriate for easy access to any Portacath or PICC line.   We strive to give you quality time with your provider. You may need to reschedule your appointment if you arrive late (15 or more minutes).  Arriving late affects you and other patients whose appointments are after yours.  Also, if you miss three or more appointments without notifying the office, you may be dismissed from the clinic at the provider's discretion.      For prescription refill requests, have your pharmacy contact our office and allow 72 hours for refills to be completed.    Today you received the following port flush   To help prevent nausea and vomiting after your treatment, we encourage you to take your nausea medication as directed.  BELOW ARE SYMPTOMS THAT SHOULD BE REPORTED IMMEDIATELY: *FEVER GREATER THAN 100.4 F (38 C) OR HIGHER *CHILLS OR SWEATING *NAUSEA AND VOMITING THAT IS NOT CONTROLLED WITH YOUR NAUSEA MEDICATION *UNUSUAL SHORTNESS OF BREATH *UNUSUAL BRUISING OR BLEEDING *URINARY PROBLEMS (pain or burning when urinating, or frequent urination) *BOWEL PROBLEMS (unusual diarrhea, constipation, pain near the anus) TENDERNESS IN MOUTH AND THROAT WITH OR WITHOUT PRESENCE OF ULCERS (sore throat, sores in mouth, or a toothache) UNUSUAL RASH, SWELLING OR PAIN  UNUSUAL VAGINAL DISCHARGE OR ITCHING   Items with * indicate a potential emergency and should be followed up as soon as possible or go to the Emergency  Department if any problems should occur.  Please show the CHEMOTHERAPY ALERT CARD or IMMUNOTHERAPY ALERT CARD at check-in to the Emergency Department and triage nurse.  Should you have questions after your visit or need to cancel or reschedule your appointment, please contact [facility] [place] - A DEPT OF [facility] Brideg[facility] [phone]  and follow the prompts.  Office hours are 8:00 a.m. to 4:30 p.m. Monday - Friday. Please note that voicemails left after 4:00 p.m. may not be returned until the following business day.  We are closed weekends and major holidays. You have access to a nurse at all times for urgent questions. Please call the main number to the clinic [phone] and follow the prompts.  For any non-urgent questions, you may also contact your provider using MyChart. We now offer e-Visits for anyone 15 and older to request care online for non-urgent symptoms. For details visit [email or web address].   Also download the MyChart app! Go to the app store, search "MyChart", open the app, select [place], and log in with your MyChart username and password.

## 2023-12-23 NOTE — Progress Notes (Unsigned)
Cristina Santos presented for Portacath access and flush. Proper placement of portacath confirmed by CXR. Portacath located right chest wall accessed with  H 20 needle. Good blood return present. Portacath flushed with 20ml NS and 500U/54ml Heparin and needle removed intact. Procedure without incident. Patient tolerated procedure well.

## 2023-12-24 ENCOUNTER — Encounter: Payer: Self-pay | Admitting: Hematology

## 2023-12-24 ENCOUNTER — Encounter: Payer: Self-pay | Admitting: Physician Assistant

## 2023-12-31 ENCOUNTER — Ambulatory Visit: Payer: MEDICARE

## 2024-01-01 ENCOUNTER — Inpatient Hospital Stay: Payer: MEDICARE

## 2024-01-10 ENCOUNTER — Inpatient Hospital Stay: Payer: MEDICARE

## 2024-01-10 VITALS — BP 114/54 | HR 66 | Temp 97.5°F | Resp 16

## 2024-01-10 DIAGNOSIS — D509 Iron deficiency anemia, unspecified: Secondary | ICD-10-CM

## 2024-01-10 DIAGNOSIS — D500 Iron deficiency anemia secondary to blood loss (chronic): Secondary | ICD-10-CM | POA: Diagnosis not present

## 2024-01-10 MED ORDER — SODIUM CHLORIDE 0.9 % IV SOLN
1000.0000 mg | Freq: Once | INTRAVENOUS | Status: AC
  Administered 2024-01-10: 1000 mg via INTRAVENOUS
  Filled 2024-01-10: qty 1000

## 2024-01-10 MED ORDER — SODIUM CHLORIDE 0.9 % IV SOLN: Freq: Once | INTRAVENOUS | Status: AC

## 2024-01-10 MED ORDER — ACETAMINOPHEN 325 MG PO TABS
650.0000 mg | ORAL_TABLET | Freq: Once | ORAL | Status: AC
Start: 2024-01-10 — End: 2024-01-10
  Administered 2024-01-10: 650 mg via ORAL
  Filled 2024-01-10: qty 2

## 2024-01-10 MED ORDER — CETIRIZINE HCL 10 MG/ML IV SOLN
5.0000 mg | Freq: Once | INTRAVENOUS | Status: AC
  Administered 2024-01-10: 5 mg via INTRAVENOUS
  Filled 2024-01-10: qty 1

## 2024-01-10 NOTE — Patient Instructions (Signed)
[facility] [place] - [facility]  Discharge Instructions: Thank you for choosing [facility] to provide your oncology and hematology care.  If you have a lab appointment with the Cancer Center - please note that after April 8th, 2024, all labs will be drawn in the cancer center.  You do not have to check in or register with the main entrance as you have in the past but will complete your check-in in the cancer center.  Wear comfortable clothing and clothing appropriate for easy access to any Portacath or PICC line.   We strive to give you quality time with your provider. You may need to reschedule your appointment if you arrive late (15 or more minutes).  Arriving late affects you and other patients whose appointments are after yours.  Also, if you miss three or more appointments without notifying the office, you may be dismissed from the clinic at the provider's discretion.      For prescription refill requests, have your pharmacy contact our office and allow 72 hours for refills to be completed.    Today you received Monoferrric IV iron infusion.     BELOW ARE SYMPTOMS THAT SHOULD BE REPORTED IMMEDIATELY: *FEVER GREATER THAN 100.4 F (38 C) OR HIGHER *CHILLS OR SWEATING *NAUSEA AND VOMITING THAT IS NOT CONTROLLED WITH YOUR NAUSEA MEDICATION *UNUSUAL SHORTNESS OF BREATH *UNUSUAL BRUISING OR BLEEDING *URINARY PROBLEMS (pain or burning when urinating, or frequent urination) *BOWEL PROBLEMS (unusual diarrhea, constipation, pain near the anus) TENDERNESS IN MOUTH AND THROAT WITH OR WITHOUT PRESENCE OF ULCERS (sore throat, sores in mouth, or a toothache) UNUSUAL RASH, SWELLING OR PAIN  UNUSUAL VAGINAL DISCHARGE OR ITCHING   Items with * indicate a potential emergency and should be followed up as soon as possible or go to the Emergency Department if any problems should occur.  Please show the CHEMOTHERAPY ALERT CARD or IMMUNOTHERAPY ALERT CARD  at check-in to the Emergency Department and triage nurse.  Should you have questions after your visit or need to cancel or reschedule your appointment, please contact [facility] [place] - A DEPT OF [facility] [phone]  and follow the prompts.  Office hours are 8:00 a.m. to 4:30 p.m. Monday - Friday. Please note that voicemails left after 4:00 p.m. may not be returned until the following business day.  We are closed weekends and major holidays. You have access to a nurse at all times for urgent questions. Please call the main number to the clinic [phone] and follow the prompts.  For any non-urgent questions, you may also contact your provider using MyChart. We now offer e-Visits for anyone 12 and older to request care online for non-urgent symptoms. For details visit [email or web address].   Also download the MyChart app! Go to the app store, search "MyChart", open the app, select [place], and log in with your MyChart username and password.

## 2024-01-10 NOTE — Progress Notes (Signed)
Patient presents today for iron infusion.  Patient is in satisfactory condition with no new complaints voiced.  Vital signs are stable.  We will proceed with infusion per provider orders.    Peripheral IV started with good blood pre and post infusion.   Monoferric 1,000 mg given today per MD orders. Tolerated infusion without adverse affects. Vital signs stable. No complaints at this time. Discharged from clinic ambulatory in stable condition. Alert and oriented x 3. F/U with [facility] as scheduled.

## 2024-03-16 ENCOUNTER — Encounter: Payer: Self-pay | Admitting: Internal Medicine

## 2024-03-16 ENCOUNTER — Ambulatory Visit (INDEPENDENT_AMBULATORY_CARE_PROVIDER_SITE_OTHER): Payer: MEDICARE | Admitting: Internal Medicine

## 2024-03-16 ENCOUNTER — Other Ambulatory Visit (INDEPENDENT_AMBULATORY_CARE_PROVIDER_SITE_OTHER): Payer: MEDICARE

## 2024-03-16 VITALS — BP 138/66 | HR 60 | Ht 65.0 in | Wt 189.0 lb

## 2024-03-16 DIAGNOSIS — K7581 Nonalcoholic steatohepatitis (NASH): Secondary | ICD-10-CM

## 2024-03-16 DIAGNOSIS — K7460 Unspecified cirrhosis of liver: Secondary | ICD-10-CM

## 2024-03-16 DIAGNOSIS — K55069 Acute infarction of intestine, part and extent unspecified: Secondary | ICD-10-CM | POA: Diagnosis not present

## 2024-03-16 DIAGNOSIS — K766 Portal hypertension: Secondary | ICD-10-CM

## 2024-03-16 DIAGNOSIS — D500 Iron deficiency anemia secondary to blood loss (chronic): Secondary | ICD-10-CM

## 2024-03-16 DIAGNOSIS — L299 Pruritus, unspecified: Secondary | ICD-10-CM

## 2024-03-16 DIAGNOSIS — R300 Dysuria: Secondary | ICD-10-CM

## 2024-03-16 DIAGNOSIS — K3189 Other diseases of stomach and duodenum: Secondary | ICD-10-CM

## 2024-03-16 DIAGNOSIS — R21 Rash and other nonspecific skin eruption: Secondary | ICD-10-CM

## 2024-03-16 LAB — COMPREHENSIVE METABOLIC PANEL WITH GFR
ALT: 22 U/L (ref 0–35)
AST: 25 U/L (ref 0–37)
Albumin: 4 g/dL (ref 3.5–5.2)
Alkaline Phosphatase: 69 U/L (ref 39–117)
BUN: 21 mg/dL (ref 6–23)
CO2: 30 meq/L (ref 19–32)
Calcium: 9.6 mg/dL (ref 8.4–10.5)
Chloride: 102 meq/L (ref 96–112)
Creatinine, Ser: 0.94 mg/dL (ref 0.40–1.20)
GFR: 55.71 mL/min — ABNORMAL LOW (ref >60.00)
Glucose, Bld: 86 mg/dL (ref 70–99)
Potassium: 4.1 meq/L (ref 3.5–5.1)
Sodium: 138 meq/L (ref 135–145)
Total Bilirubin: 0.9 mg/dL (ref 0.2–1.2)
Total Protein: 6.6 g/dL (ref 6.0–8.3)

## 2024-03-16 LAB — CBC WITH DIFFERENTIAL/PLATELET
Basophils Absolute: 0 10*3/uL (ref 0.0–0.1)
Basophils Relative: 0.4 % (ref 0.0–3.0)
Eosinophils Absolute: 0.3 10*3/uL (ref 0.0–0.7)
Eosinophils Relative: 7.5 % — ABNORMAL HIGH (ref 0.0–5.0)
HCT: 35.5 % — ABNORMAL LOW (ref 36.0–46.0)
Hemoglobin: 11.6 g/dL — ABNORMAL LOW (ref 12.0–15.0)
Lymphocytes Relative: 17.4 % (ref 12.0–46.0)
Lymphs Abs: 0.6 10*3/uL — ABNORMAL LOW (ref 0.7–4.0)
MCHC: 32.6 g/dL (ref 30.0–36.0)
MCV: 87.2 fl (ref 78.0–100.0)
Monocytes Absolute: 0.3 10*3/uL (ref 0.1–1.0)
Monocytes Relative: 8.9 % (ref 3.0–12.0)
Neutro Abs: 2.2 10*3/uL (ref 1.4–7.7)
Neutrophils Relative %: 65.8 % (ref 43.0–77.0)
Platelets: 116 10*3/uL — ABNORMAL LOW (ref 150.0–400.0)
RBC: 4.07 Mil/uL (ref 3.87–5.11)
RDW: 17.2 % — ABNORMAL HIGH (ref 11.5–15.5)
WBC: 3.4 10*3/uL — ABNORMAL LOW (ref 4.0–10.5)

## 2024-03-16 LAB — PROTIME-INR
INR: 3.5 ratio — ABNORMAL HIGH (ref 0.8–1.0)
Prothrombin Time: 35.2 s — ABNORMAL HIGH (ref 9.6–13.1)

## 2024-03-16 NOTE — Patient Instructions (Addendum)
 Your provider has requested that you go to the basement level for lab work before leaving today. Press "B" on the elevator. The lab is located at the first door on the left as you exit the elevator.  Due to recent changes in healthcare laws, you may see the results of your imaging and laboratory studies on MyChart before your provider has had a chance to review them.  We understand that in some cases there may be results that are confusing or concerning to you. Not all laboratory results come back in the same time frame and the provider may be waiting for multiple results in order to interpret others.  Please give us  48 hours in order for your provider to thoroughly review all the results before contacting the office for clarification of your results.   Aim for 1.2-1.5grams per KG of protein daily.  I appreciate the opportunity to care for you. Loy Ruff MD, [facility]

## 2024-03-16 NOTE — Progress Notes (Signed)
 Cristina Santos 82 y.o. 12-01-1940 [id number]  Assessment & Plan:   Encounter Diagnoses  Name Primary?   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  Also had increasing edema and primary care appropriately added spironolactone .  Superior mesenteric thrombosis seems resolved and anticoagulation is not required for that but she needs to continue chronic anticoagulation for her A-fib.  Lab evaluation as below.  I ordered an INR but she is on Xarelto  so that will affect that and artificially raise the INR FYI. Orders Placed This Encounter  Procedures   CBC with Differential/Platelet   Comprehensive metabolic panel with GFR   Protime-INR   AFP tumor marker   Iron, TIBC and Ferritin Panel   Urinalysis,Complete w/RFL Culture    Daughter asked a good question about protein intake in for 1.2 to 1.5 g of protein per kilogram daily.  Nighttime high-protein snack appropriate to I did not mention today but we will try to communicate with lab results.  Once I review labs we will decide on imaging studies.  Leaning toward repeat MRI given the complexity of her situation.  She has persistent edema issues and so I do think increasing diuretics should be considered.  Need to see kidney function.  She remains on low-dose nadolol  so screening EGD not necessary.   Though chronic liver disease can cause pruritus she has a rash and it is not a diffuse pruritus so I do not think it is from her cirrhosis though I cannot rule it out.  She is going to dermatology again (another opinion) and we will see what they say.  CC: Cristina Ribas, MD  Subjective:  Artificial intelligence  scribe application explained to the patient and she consented to use.  Gastroenterology summary   MASH cirrhosis with portal gastropathy, 1+ varices and portal colopathy, trace ascites HAV immune HBV vaccinated On nadolol  10 mg daily   Chronic blood loss anemia/iron deficiency anemia thought related to the gastropathy and colopathy in the setting of Eliquis therapy EGD and colonoscopy June 2024 at [facility] admission   05/06/23 colonoscopy--9 nonbleeding colon angioectasias tx'ed with APC; rectal varices; transverse and ascending colon polyps -05/06/23 EGD--grade 1 esophageal varices; portal HTN gastropathy, +gastric polyp A. STOMACH, BIOPSY:  - Oxyntic mucosa with mild chronic inflammation and few ectatic vessels  -H. pylori negative  B. COLON, ASCENDING, TRANSVERSE, POLYPECTOMY:   - Tubular adenoma (1 fragment)  - Hyperplastic polyp (1 fragment)  - Negative for high-grade dysplasia or malignancy    Remote history of colon cancer 1988     Superior mesenteric vein thrombosis diagnosed February 2024-on Eliquis   Cystic lesions of the pancreas thought benign last imaged February 2024 MR, though not reported on June 2024 CT abdomen and pelvis   ------------------------------------------------------------------------------------------------------------  Chief Complaint: Follow-up of cirrhosis  HPI 83 year old woman with liver cirrhosis secondary to MASH complicated by ascites is here for follow-up.  Her daughter Cristina Santos a Publishing rights manager is present and participates.  She has ongoing leg swelling and itching, which have persisted despite previous treatments. She was treated with Cefalexin for cellulitis in March, which helped reduce the swelling and  weeping of her legs. Currently, she is on spironolactone  50 mg daily and Lasix  40 mg daily, which have helped reduce the swelling, but some issues remain.  She experiences itching on her arms, chest, and legs, which disrupts  her sleep. She uses Atarax at night to help with sleep but avoids it during the day due to its sedative effects. The itching has been a long-standing issue, persisting for a couple of years, and she is scheduled to see a dermatologist soon.  She notes a change in her urine, describing it as 'Cristina Santos' with an unusual odor over the past few days. Her medical history includes elevated ammonia levels noted in January, and she has been under the care of a hematologist for blood count monitoring. She received an iron infusion about a month ago and continues to take Xarelto  for atrial fibrillation.  She has experienced episodes of diarrhea, including a recent one this morning and a prolonged episode last month, which she attributes to a viral infection. She is working on improving her protein intake, using protein powder supplements to address potential nutritional deficiencies.  She lives alone and has no significant mental status changes, being able to recall recent events and details.   Last seen 10/07/2023 labs at that time AFP 2.9 normal transaminases alk phos and bilirubin a CBC 11/19/2023 platelets 127 otherwise normal hemoglobin and white count  Wt Readings from Last 3 Encounters:  03/16/24 189 lb (85.7 kg)  12/23/23 192 lb 14.4 oz (87.5 kg)  11/22/23 190 lb 9.6 oz (86.5 kg)     She saw hematology who recommended she did not need to take Xarelto  any longer in February with respect to the SMV thrombosis as it had resolved (see below).  They wanted her to check with cardiology given history of atrial fibrillation.  Cardiology recommend continuing.  CT venogram abdomen and pelvis 11/19/2023 IMPRESSION: Since the CT 12/27/2022, there has been evolution of prior superior mesenteric vein thrombus, now with only minor chronic wall thickening/scarring and no evidence of recurrent thrombus, stenosis, or occlusion.   Stigmata of clinically significant portal hypertension and cirrhosis given the  presence of esophageal varices, splenomegaly, and mesenteric portal-portal and portal-systemic draining veins. Hepatology follow-up is indicated.   MR abdomen 12/10/2022 IMPRESSION: 1. Stable appearance of lesion in the dome of the RIGHT hemiliver, hepatic subsegment VII. No corresponding T2 abnormality or diffusion abnormality though this area is confounded by presence of adjacent lung. Findings favoring benign lesion such as a small vascular shunt. LI-RADS category 3 on previous imaging, given 2 years stability lack of visibility on later phase with blood pool signal on earlier phase images categorized as LI-RADS category 2. 2. Cirrhosis and splenomegaly. 3. Eccentric filling defect in SMV is persistent across many sequences and is suspicious for nonocclusive thrombus just below the splenic portal confluence. Consider CT venogram for confirmation. 4. Lesion in the posterior pancreatic head is 12 x 8 mm is within 1 mm of its size in May 2022. Other cystic lesions in the pancreas, for instance in the genu of the pancreas now 5 mm previously greater than a cm in May of 2022. Could consider follow-up at 2 years as warranted in a patient of this age.   Allergies  Allergen Reactions   Latex Anaphylaxis   Codeine Palpitations   Current Meds  Medication Sig   albuterol  (VENTOLIN  HFA) 108 (90 Base) MCG/ACT inhaler Inhale 1 puff into the lungs as needed.   ascorbic acid (VITAMIN C) 500 MG tablet  Take 500 mg by mouth daily.   Biotin 1000 MCG tablet Take 1,000 mcg by mouth daily.   Cholecalciferol 50 MCG (2000 UT) TABS Take 2,000 Units by mouth daily.   diphenhydrAMINE -APAP, sleep, (TYLENOL  PM EXTRA STRENGTH PO) Take 1 tablet by mouth as needed.   empagliflozin  (JARDIANCE ) 10 MG TABS tablet TAKE 1 TABLET BY MOUTH ONCE DAILY BEFORE BREAKFAST   ferrous sulfate  325 (65 FE) MG tablet Take 1 tablet (325 mg total) by mouth 2 (two) times daily with a meal. (Patient taking differently: Take 325 mg  by mouth daily with breakfast.)   furosemide  (LASIX ) 40 MG tablet Take 1 tablet (40 mg total) by mouth daily.   hydrOXYzine (ATARAX) 25 MG tablet Take 25 mg by mouth at bedtime.   lactulose  (CHRONULAC ) 10 GM/15ML solution Take 10 g by mouth as needed.   levothyroxine  (SYNTHROID ) 75 MCG tablet Take 75 mcg by mouth daily before breakfast.    lidocaine -prilocaine  (EMLA ) cream Apply 1 Application topically as needed. Apply to skin over port 30 minutes before port flush lab draws   magnesium  30 MG tablet Take 400 mg by mouth daily.   melatonin 5 MG TABS Take 5 mg by mouth at bedtime.   Multiple Vitamins-Minerals (PRESERVISION AREDS PO) Take 1 tablet by mouth daily.   nadolol  (CORGARD ) 20 MG tablet Take 0.5 tablets (10 mg total) by mouth daily.   pantoprazole  (PROTONIX ) 40 MG tablet Take 1 tablet (40 mg total) by mouth 2 (two) times daily before a meal.   potassium chloride  SA (KLOR-CON  M) 20 MEQ tablet Take 1 tablet (20 mEq total) by mouth daily.   pramipexole  (MIRAPEX ) 0.75 MG tablet Take 0.75 mg by mouth every evening.   spironolactone  (ALDACTONE ) 50 MG tablet Take 50 mg by mouth daily.   triamcinolone cream (KENALOG) 0.1 % Apply topically.   vitamin B-12 (CYANOCOBALAMIN) 1000 MCG tablet Take 1,000 mcg by mouth daily.   XARELTO  20 MG TABS tablet Take 1 tablet (20 mg total) by mouth daily. Restart on 05/09/23   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 10/09/2020   Pancreatic cysts 10/09/2020   Portal hypertension (HCC)-gastropathy and colopathy 10/09/2020   Sleep apnea    Thrombosis of mesenteric vein (HCC) SMV    SMV   Thyroid disease    Vestibular migraine    Past Surgical  History:  Procedure Laterality Date   ABDOMINAL HYSTERECTOMY  1987   BIOPSY  05/06/2023   Procedure: BIOPSY;  Surgeon: Vinetta Greening, DO;  [facility]: [facility];  Service: Endoscopy;;   COLON RESECTION     COLONOSCOPY     COLONOSCOPY WITH PROPOFOL  N/A 05/06/2023   Procedure: COLONOSCOPY WITH PROPOFOL ;  Surgeon: Vinetta Greening, DO;  Location: [facility];  Service: Endoscopy;  Laterality: N/A;   ESOPHAGOGASTRODUODENOSCOPY (EGD) WITH PROPOFOL  N/A 05/06/2023   Procedure: ESOPHAGOGASTRODUODENOSCOPY (EGD) WITH PROPOFOL ;  Surgeon: Vinetta Greening, DO;  [facility]: [facility];  Service: Endoscopy;  Laterality: N/A;   HOT HEMOSTASIS  05/06/2023   Procedure: HOT HEMOSTASIS (ARGON PLASMA COAGULATION/BICAP);  Surgeon: Vinetta Greening, DO;  [facility]: [facility];  Service: Endoscopy;;   POLYPECTOMY  05/06/2023  Procedure: POLYPECTOMY;  Surgeon: Vinetta Greening, DO;  [facility]: [facility];  Service: Endoscopy;;   PORTACATH PLACEMENT     Never removed placed at time of colon cancer treatment   SHOULDER ARTHROSCOPY     left   TOTAL KNEE ARTHROPLASTY Left 06/08/2019   Procedure: TOTAL KNEE ARTHROPLASTY;  Surgeon: Liliane Rei, MD;  Location: [facility];  Service: Orthopedics;  Laterality: Left;    UPPER GASTROINTESTINAL ENDOSCOPY     Social History   Social History Narrative   Married, 1 son one daughter. Daughter is a Publishing rights manager.   She is retired   2 cups coffee per day   family history includes Alzheimer's disease in her mother; Bone cancer in her brother and brother; Diabetes in her brother and mother; Heart attack in her brother; Heart disease in her brother; Lung cancer in her brother, brother, and sister; Prostate cancer in her brother; Ulcerative colitis in her sister.   Review of Systems See HPI  Objective:   Physical Exam @BP  138/66   Pulse 60   Ht 5\' 5"  (1.651 m)   Wt 189 lb (85.7 kg)   BMI 31.45 kg/m @  General:  Well-developed, well-nourished and  in no acute distress Eyes:  anicteric.  Lungs: Clear to auscultation bilaterally. Heart:   S1S2, no rubs, murmurs, gallops. Abdomen:  soft, mildly-tender RUQ and RLQ, no palpablehepatosplenomegaly, hernia, or mass and BS+.  Seems obese as opposed to ascites Extremities:   See photo 1+ bilat edema,LE Skin   See photo + she has numerous pink placques arms and chest wall Neuro:  A&O x 3. No asterixis Psych:  appropriate mood and  Affect.    Data Reviewed: See HPI.  42 minutes total time spent with on this visit before during and after the patient interaction.

## 2024-03-18 ENCOUNTER — Other Ambulatory Visit: Payer: Self-pay

## 2024-03-18 ENCOUNTER — Encounter: Payer: Self-pay | Admitting: Physician Assistant

## 2024-03-18 DIAGNOSIS — K862 Cyst of pancreas: Secondary | ICD-10-CM

## 2024-03-18 DIAGNOSIS — K7460 Unspecified cirrhosis of liver: Secondary | ICD-10-CM

## 2024-03-18 LAB — URINE CULTURE
MICRO NUMBER:: 16385730
SPECIMEN QUALITY:: ADEQUATE

## 2024-03-18 LAB — IRON,TIBC AND FERRITIN PANEL
%SAT: 17 % (ref 16–45)
Ferritin: 36 ng/mL (ref 16–288)
Iron: 47 ug/dL (ref 45–160)
TIBC: 279 ug/dL (ref 250–450)

## 2024-03-18 LAB — URINALYSIS, COMPLETE W/RFL CULTURE
Bacteria, UA: NONE SEEN /HPF
Bilirubin Urine: NEGATIVE
Hgb urine dipstick: NEGATIVE
Hyaline Cast: NONE SEEN /LPF
Ketones, ur: NEGATIVE
Leukocyte Esterase: NEGATIVE
Nitrites, Initial: NEGATIVE
Protein, ur: NEGATIVE
RBC / HPF: NONE SEEN /HPF (ref 0–2)
Specific Gravity, Urine: 1.035 (ref 1.001–1.035)
pH: <=5 (ref 5.0–8.0)

## 2024-03-18 LAB — AFP TUMOR MARKER: AFP-Tumor Marker: 3.6 ng/mL

## 2024-03-18 LAB — CULTURE INDICATED

## 2024-03-24 ENCOUNTER — Ambulatory Visit (HOSPITAL_COMMUNITY)
Admission: RE | Admit: 2024-03-24 | Discharge: 2024-03-24 | Disposition: A | Discharge status: Home / Self Care | Payer: MEDICARE | Source: Ambulatory Visit | Attending: Internal Medicine | Admitting: Internal Medicine

## 2024-03-24 DIAGNOSIS — K862 Cyst of pancreas: Secondary | ICD-10-CM | POA: Diagnosis present

## 2024-03-24 DIAGNOSIS — K7460 Unspecified cirrhosis of liver: Secondary | ICD-10-CM | POA: Insufficient documentation

## 2024-03-24 DIAGNOSIS — K7581 Nonalcoholic steatohepatitis (NASH): Secondary | ICD-10-CM | POA: Diagnosis present

## 2024-03-24 MED ORDER — GADOBUTROL 1 MMOL/ML IV SOLN
9.0000 mL | Freq: Once | INTRAVENOUS | Status: AC | PRN
  Administered 2024-03-24: 9 mL via INTRAVENOUS

## 2024-03-25 ENCOUNTER — Inpatient Hospital Stay: Payer: MEDICARE

## 2024-03-25 ENCOUNTER — Inpatient Hospital Stay: Payer: MEDICARE | Attending: Hematology

## 2024-03-25 VITALS — BP 112/64 | HR 50 | Temp 96.7°F | Resp 19

## 2024-03-25 DIAGNOSIS — D509 Iron deficiency anemia, unspecified: Secondary | ICD-10-CM | POA: Diagnosis present

## 2024-03-25 DIAGNOSIS — Z95828 Presence of other vascular implants and grafts: Secondary | ICD-10-CM

## 2024-03-25 DIAGNOSIS — D500 Iron deficiency anemia secondary to blood loss (chronic): Secondary | ICD-10-CM

## 2024-03-25 LAB — CBC WITH DIFFERENTIAL/PLATELET
Abs Immature Granulocytes: 0.01 10*3/uL (ref 0.00–0.07)
Basophils Absolute: 0 10*3/uL (ref 0.0–0.1)
Basophils Relative: 0 %
Eosinophils Absolute: 0.2 10*3/uL (ref 0.0–0.5)
Eosinophils Relative: 6 %
HCT: 33.1 % — ABNORMAL LOW (ref 36.0–46.0)
Hemoglobin: 10.5 g/dL — ABNORMAL LOW (ref 12.0–15.0)
Immature Granulocytes: 0 %
Lymphocytes Relative: 20 %
Lymphs Abs: 0.6 10*3/uL — ABNORMAL LOW (ref 0.7–4.0)
MCH: 28.5 pg (ref 26.0–34.0)
MCHC: 31.7 g/dL (ref 30.0–36.0)
MCV: 89.7 fL (ref 80.0–100.0)
Monocytes Absolute: 0.2 10*3/uL (ref 0.1–1.0)
Monocytes Relative: 9 %
Neutro Abs: 1.8 10*3/uL (ref 1.7–7.7)
Neutrophils Relative %: 65 %
Platelets: 93 10*3/uL — ABNORMAL LOW (ref 150–400)
RBC: 3.69 MIL/uL — ABNORMAL LOW (ref 3.87–5.11)
RDW: 15.9 % — ABNORMAL HIGH (ref 11.5–15.5)
Smear Review: DECREASED
WBC: 2.7 10*3/uL — ABNORMAL LOW (ref 4.0–10.5)
nRBC: 0 % (ref 0.0–0.2)

## 2024-03-25 LAB — FERRITIN: Ferritin: 24 ng/mL (ref 11–307)

## 2024-03-25 LAB — IRON AND TIBC
Iron: 81 ug/dL (ref 28–170)
Saturation Ratios: 27 % (ref 10.4–31.8)
TIBC: 303 ug/dL (ref 250–450)
UIBC: 222 ug/dL

## 2024-03-25 MED ORDER — SODIUM CHLORIDE 0.9 % IV SOLN
Freq: Once | INTRAVENOUS | Status: AC
Start: 2024-03-25 — End: 2024-03-25

## 2024-03-25 MED ORDER — SODIUM CHLORIDE 0.9 % IV SOLN
1000.0000 mg | Freq: Once | INTRAVENOUS | Status: AC
  Administered 2024-03-25: 1000 mg via INTRAVENOUS
  Filled 2024-03-25: qty 1000

## 2024-03-25 MED ORDER — ACETAMINOPHEN 325 MG PO TABS
650.0000 mg | ORAL_TABLET | Freq: Once | ORAL | Status: AC
  Administered 2024-03-25: 650 mg via ORAL
  Filled 2024-03-25: qty 2

## 2024-03-25 MED ORDER — SODIUM CHLORIDE FLUSH 0.9 % IV SOLN
10.0000 mL | Freq: Once | INTRAVENOUS | Status: AC
  Administered 2024-03-25: 10 mL via INTRAVENOUS
  Filled 2024-03-25: qty 10

## 2024-03-25 MED ORDER — CETIRIZINE HCL 10 MG/ML IV SOLN
5.0000 mg | Freq: Once | INTRAVENOUS | Status: AC
  Administered 2024-03-25: 5 mg via INTRAVENOUS
  Filled 2024-03-25: qty 1

## 2024-03-25 MED ORDER — HEPARIN SOD (PORK) LOCK FLUSH 100 UNIT/ML IV SOLN
500.0000 [IU] | Freq: Once | INTRAVENOUS | Status: AC | PRN
  Administered 2024-03-25: 500 [IU]

## 2024-03-25 NOTE — Patient Instructions (Signed)

## 2024-03-25 NOTE — Progress Notes (Signed)
 Patient tolerated iron infusion with no complaints voiced.  Port site clean and dry with good blood return noted before and after infusion.  Band aid applied.  Pt observed for 30 minutes post iron infusion without any complications. VSS with discharge and left in satisfactory condition with no s/s of distress noted. All follow ups a scheduled.   Cristina Santos [employer]

## 2024-03-27 ENCOUNTER — Other Ambulatory Visit: Payer: MEDICARE

## 2024-03-31 NOTE — Progress Notes (Unsigned)
 VIRTUAL VISIT via TELEPHONE NOTE [facility]   I connected with Cristina Santos  on 04/01/24 at  8:40 AM by telephone and verified that I am speaking with the correct person using two identifiers.  Location: Patient: Home Provider: [facility]   I discussed the limitations, risks, security and privacy concerns of performing an evaluation and management service by telephone and the availability of in person appointments. I also discussed with the patient that there may be a patient responsible charge related to this service. The patient expressed understanding and agreed to proceed.  REASON FOR VISIT:  Follow-up for iron deficiency anemia and SMV thrombus  PRIOR THERAPY: Oral iron therapy  CURRENT THERAPY: Intermittent IV iron (most recent Monoferric  on 03/25/2024)  INTERVAL HISTORY:   [name] 83 y.o. 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  She was last seen by Sheril Dines PA-C on 12/23/2023.  In the interim since last visit, patient's daughter reached out to report low hemoglobin and iron levels when checked by gastroenterologist on 03/16/2024.  She was then scheduled for IV Monoferric  on 03/25/2024.  At today's visit, she reports feeling improved energy after IV iron.  She denies any current fatigue, headaches, chest pain, dyspnea on exertion, lightheadedness, or syncope.  She has not noticed any melena, hematochezia or other signs of overt gastrointestinal bleeding.  She denies any associated nausea, vomiting, abdominal pain.  She continues to take Xarelto  for her SMV thrombus (restarted after her visit in July 2024).  She continues to take daily iron tablet. She has 75% energy and 100% appetite. She endorses that she is maintaining a stable weight.  ASSESSMENT & PLAN:  1.  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  B12 and folic acid normal. - NASH  cirrhosis, low-grade esophageal and colonic/rectal varices. - Colonoscopy (05/06/2023): Rectal varices, diverticulosis in the sigmoid colon.  9 nonbleeding colonic angiodysplastic lesions, treated with APC. - EGD (05/06/2023): Grade 1 esophageal varices, portal hypertensive gastropathy, gastritis.  Multiple gastric polyps and normal duodenum. - Most recent IV iron with Monoferric  1 g on 03/25/2024 - She is taking iron tablet daily.   - Most recent labs (03/25/2024, prior to IV iron given same day): Hgb 10.5/MCV 89.7.  Ferritin 24, iron saturation 27% - She denies any obvious rectal bleeding or melena. - Symptoms improved after IV iron - PLAN: She received IV Monoferric  on 03/25/2024, no additional IV iron at this time.  We will recheck labs and discussed with phone visit in 2 months.   - She remains at risk for ongoing GI bleeding, therefore we will continue close monitoring of CBC and iron levels.  2.  Superior mesenteric vein thrombus: - MRI (12/10/2022): Eccentric filling defect in the SMV is persistent across many sequences suspicious for nonocclusive thrombus just below the splenic portal confluence. 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  No filling defects and portal vein branches. - CT venogram abdomen/pelvis (11/19/2023): Resolution of prior SMV thrombus, now with only minor chronic wall thickening/scarring and no evidence of recurrent thrombus, stenosis, or occlusion.  She does have stigmata of clinically significant portal hypertension and cirrhosis. - Most recent D-dimer (11/19/2023) elevated at 1.00, but same day CT venogram was negative for SMV thrombus -  Although SMV thrombus has resolved, she remains on Xarelto  due to atrial fibrillation  3.  History of [facility] colon cancer: - Underwent surgical  resection in 1988, received leucovorin and 5-FU for a year as part of clinical trial at [facility].  She is still has a functioning port since then. - PLAN: Port flush twice yearly.  (Last done 03/25/2024) - Prefers peripheral lab draw for routine labs. - No indication for further labs (CEA) or imaging at this time.  4.  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  Social/family history: - She lives by herself and is independent of ADLs and IADLs.  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  Non-smoker.  No exposure to chemicals. - Her daughter Cristina Santos is a hospitalist APP at [employer] in [place]. - Sister had metastatic kidney cancer and brother had metastatic prostate cancer.  PLAN SUMMARY: >> Labs in 2 months = CBC/D, ferritin, iron/TIBC >> PHONE visit in 2 months (after labs)  >> PORT FLUSH every 6 months (last done 03/25/2024, next due November 2025)     REVIEW OF SYSTEMS:   Review of Systems  Constitutional:  Negative for chills, diaphoresis, fever, malaise/fatigue and weight loss.  Respiratory:  Negative for cough and shortness of breath.   Cardiovascular:  Negative for chest pain and palpitations.  Gastrointestinal:  Negative for abdominal pain, blood in stool, melena, nausea and vomiting.  Neurological:  Negative for dizziness and headaches.     PHYSICAL EXAM: (per limitations of virtual telephone visit)  The patient is alert and oriented x 3, exhibiting adequate mentation, good mood, and ability to speak in full sentences and execute sound judgement.  Patient reported vital signs that she checked on herself from home this morning: Weight 182.2 lbs Blood pressure 116/54 Heart rate 59 Oxygen saturation 95% on room air  WRAP UP:   I discussed the assessment and treatment plan with the patient. The patient was  provided an opportunity to ask questions and all were answered. The patient agreed with the plan and demonstrated an understanding of the instructions.   The patient was advised to call back or seek an in-person evaluation if the symptoms worsen or if the condition fails to improve as anticipated.  I provided 21 minutes of non-face-to-face time during this encounter, including >10 minutes of medical discussion.  Sonnie Dusky, PA-C 04/01/24 9:01 AM

## 2024-04-01 ENCOUNTER — Inpatient Hospital Stay: Payer: MEDICARE | Admitting: Physician Assistant

## 2024-04-01 DIAGNOSIS — D500 Iron deficiency anemia secondary to blood loss (chronic): Secondary | ICD-10-CM | POA: Diagnosis not present

## 2024-04-01 DIAGNOSIS — K55069 Acute infarction of intestine, part and extent unspecified: Secondary | ICD-10-CM

## 2024-04-13 ENCOUNTER — Encounter: Payer: Self-pay | Admitting: Internal Medicine

## 2024-04-15 NOTE — Telephone Encounter (Signed)
 Order cancelled. Recall request for 6 month f/u placed in computer.

## 2024-04-27 ENCOUNTER — Other Ambulatory Visit: Payer: Self-pay | Admitting: Cardiology

## 2024-04-27 DIAGNOSIS — I5032 Chronic diastolic (congestive) heart failure: Secondary | ICD-10-CM

## 2024-06-01 ENCOUNTER — Inpatient Hospital Stay: Payer: MEDICARE | Attending: Hematology

## 2024-06-01 DIAGNOSIS — D500 Iron deficiency anemia secondary to blood loss (chronic): Secondary | ICD-10-CM | POA: Insufficient documentation

## 2024-06-01 LAB — IRON AND TIBC
Iron: 45 ug/dL (ref 28–170)
Saturation Ratios: 16 % (ref 10.4–31.8)
TIBC: 276 ug/dL (ref 250–450)
UIBC: 231 ug/dL

## 2024-06-01 LAB — CBC WITH DIFFERENTIAL/PLATELET
Abs Immature Granulocytes: 0.01 K/uL (ref 0.00–0.07)
Basophils Absolute: 0 K/uL (ref 0.0–0.1)
Basophils Relative: 0 %
Eosinophils Absolute: 0.2 K/uL (ref 0.0–0.5)
Eosinophils Relative: 7 %
HCT: 33.3 % — ABNORMAL LOW (ref 36.0–46.0)
Hemoglobin: 10 g/dL — ABNORMAL LOW (ref 12.0–15.0)
Immature Granulocytes: 0 %
Lymphocytes Relative: 19 %
Lymphs Abs: 0.5 K/uL — ABNORMAL LOW (ref 0.7–4.0)
MCH: 27.8 pg (ref 26.0–34.0)
MCHC: 30 g/dL (ref 30.0–36.0)
MCV: 92.5 fL (ref 80.0–100.0)
Monocytes Absolute: 0.2 K/uL (ref 0.1–1.0)
Monocytes Relative: 9 %
Neutro Abs: 1.7 K/uL (ref 1.7–7.7)
Neutrophils Relative %: 65 %
Platelets: 94 K/uL — ABNORMAL LOW (ref 150–400)
RBC: 3.6 MIL/uL — ABNORMAL LOW (ref 3.87–5.11)
RDW: 15.9 % — ABNORMAL HIGH (ref 11.5–15.5)
WBC: 2.7 K/uL — ABNORMAL LOW (ref 4.0–10.5)
nRBC: 0 % (ref 0.0–0.2)

## 2024-06-01 LAB — FERRITIN: Ferritin: 38 ng/mL (ref 11–307)

## 2024-06-01 MED ORDER — HEPARIN SOD (PORK) LOCK FLUSH 100 UNIT/ML IV SOLN
500.0000 [IU] | Freq: Once | INTRAVENOUS | Status: AC
  Administered 2024-06-01: 500 [IU] via INTRAVENOUS

## 2024-06-01 MED ORDER — SODIUM CHLORIDE 0.9% FLUSH
10.0000 mL | INTRAVENOUS | Status: AC
  Administered 2024-06-01: 10 mL via INTRAVENOUS

## 2024-06-01 NOTE — Progress Notes (Signed)
 Patients port flushed without difficulty.  Good blood return noted with no bruising or swelling noted at site.  Gauze dressing applied.  VSS with discharge and left in satisfactory condition with no s/s of distress noted.

## 2024-06-01 NOTE — Patient Instructions (Signed)
 [facility] [place] - A DEPT OF [facility]  Discharge Instructions: Thank you for choosing [facility] to provide your oncology and hematology care.  If you have a lab appointment with the Cancer Center - please note that after April 8th, 2024, all labs will be drawn in the cancer center.  You do not have to check in or register with the main entrance as you have in the past but will complete your check-in in the cancer center.  Wear comfortable clothing and clothing appropriate for easy access to any Portacath or PICC line.   We strive to give you quality time with your provider. You may need to reschedule your appointment if you arrive late (15 or more minutes).  Arriving late affects you and other patients whose appointments are after yours.  Also, if you miss three or more appointments without notifying the office, you may be dismissed from the clinic at the provider's discretion.      For prescription refill requests, have your pharmacy contact our office and allow 72 hours for refills to be completed.    Today you received the following: Port flush with labs   To help prevent nausea and vomiting after your treatment, we encourage you to take your nausea medication as directed.  BELOW ARE SYMPTOMS THAT SHOULD BE REPORTED IMMEDIATELY: *FEVER GREATER THAN 100.4 F (38 C) OR HIGHER *CHILLS OR SWEATING *NAUSEA AND VOMITING THAT IS NOT CONTROLLED WITH YOUR NAUSEA MEDICATION *UNUSUAL SHORTNESS OF BREATH *UNUSUAL BRUISING OR BLEEDING *URINARY PROBLEMS (pain or burning when urinating, or frequent urination) *BOWEL PROBLEMS (unusual diarrhea, constipation, pain near the anus) TENDERNESS IN MOUTH AND THROAT WITH OR WITHOUT PRESENCE OF ULCERS (sore throat, sores in mouth, or a toothache) UNUSUAL RASH, SWELLING OR PAIN  UNUSUAL VAGINAL DISCHARGE OR ITCHING   Items with * indicate a potential emergency and should be followed up as soon as possible or go to  the Emergency Department if any problems should occur.  Please show the CHEMOTHERAPY ALERT CARD or IMMUNOTHERAPY ALERT CARD at check-in to the Emergency Department and triage nurse.  Should you have questions after your visit or need to cancel or reschedule your appointment, please contact [facility] [place] - A DEPT OF [facility] [phone]  and follow the prompts.  Office hours are 8:00 a.m. to 4:30 p.m. Monday - Friday. Please note that voicemails left after 4:00 p.m. may not be returned until the following business day.  We are closed weekends and major holidays. You have access to a nurse at all times for urgent questions. Please call the main number to the clinic [phone] and follow the prompts.  For any non-urgent questions, you may also contact your provider using MyChart. We now offer e-Visits for anyone 2 and older to request care online for non-urgent symptoms. For details visit [email or web address].   Also download the MyChart app! Go to the app store, search "MyChart", open the app, select [place], and log in with your MyChart username and password.

## 2024-06-08 ENCOUNTER — Inpatient Hospital Stay: Payer: MEDICARE | Admitting: Oncology

## 2024-06-08 ENCOUNTER — Inpatient Hospital Stay: Payer: MEDICARE | Admitting: Physician Assistant

## 2024-06-09 ENCOUNTER — Other Ambulatory Visit: Payer: Self-pay | Admitting: Nurse Practitioner

## 2024-06-16 ENCOUNTER — Other Ambulatory Visit: Payer: Self-pay

## 2024-06-16 MED ORDER — NADOLOL 20 MG PO TABS: 10.0000 mg | ORAL_TABLET | Freq: Every day | ORAL | 1 refills | Status: DC

## 2024-06-16 NOTE — Progress Notes (Unsigned)
 VIRTUAL VISIT via TELEPHONE NOTE [facility]   I connected with Cristina Santos  on 06/17/24 at  12:26 PM by telephone and verified that I am speaking with the correct person using two identifiers.  Location: Patient: Home Provider: [facility]   I discussed the limitations, risks, security and privacy concerns of performing an evaluation and management service by telephone and the availability of in person appointments. I also discussed with the patient that there may be a patient responsible charge related to this service. The patient expressed understanding and agreed to proceed.  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  She was last evaluated via telemedicine visit by Pleasant Barefoot PA-C on 04/01/2024.  At today's visit, she reports feeling fair. She has had some recurrent fatigue over the past few weeks She denies any abnormal headaches, chest pain, dyspnea on exertion, lightheadedness, or syncope. She has not noticed any melena, hematochezia or other signs of overt gastrointestinal bleeding.   She denies any associated nausea, vomiting, abdominal pain.   She continues to take Xarelto  for her atrial fibrillation; was previously on Xarelto  for SMV thrombus, but this has resolved. She continues to take daily iron tablet. She has % energy and 100% appetite.  She endorses that she is maintaining a stable weight.  ASSESSMENT & PLAN:  1.  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  B12 and folic acid normal. - NASH cirrhosis, low-grade esophageal and colonic/rectal varices. - Colonoscopy (05/06/2023): Rectal varices, diverticulosis in the  sigmoid colon.  9 nonbleeding colonic angiodysplastic lesions, treated with APC. - EGD (05/06/2023): Grade 1 esophageal varices, portal hypertensive gastropathy, gastritis.  Multiple gastric polyps and normal duodenum. - Most recent IV iron with Monoferric  1 g on 03/25/2024 - She is taking iron tablet daily.   - Most recent labs (06/01/2024): Hgb 10.0/MCV 92.5.  Ferritin 38, iron saturation 16% - She denies any obvious rectal bleeding or melena. - Symptomatic with fatigue, previously improved after IV iron - PLAN: IV Monoferric  x 1. - Repeat labs/phone visit in 2 months  - She remains at risk for ongoing GI bleeding, therefore we will continue close monitoring of CBC and iron levels.  2.  Superior mesenteric vein thrombus: - MRI (12/10/2022): Eccentric filling defect in the SMV is persistent across many sequences suspicious for nonocclusive thrombus just below the splenic portal confluence. 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  No filling defects and portal vein branches. - CT venogram abdomen/pelvis (11/19/2023): Resolution of prior SMV thrombus, now with only minor chronic wall thickening/scarring and no evidence of recurrent thrombus, stenosis, or occlusion.  She does have stigmata of clinically significant portal hypertension and cirrhosis. - Most recent D-dimer (11/19/2023) elevated at 1.00, but same day CT venogram was negative for SMV thrombus - Although SMV thrombus has resolved, she remains on Xarelto  due to atrial fibrillation  3.  History of [facility] colon cancer: - Underwent surgical resection in 1988, received leucovorin and 5-FU for a year as part of clinical trial at  [facility].  She is still has a functioning port since then. - PLAN: Port flush twice yearly.   - Prefers peripheral lab draw for  routine labs. - No indication for further labs (CEA) or imaging at this time.  4.  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  Social/family history: - She lives by herself and is independent of ADLs and IADLs.  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  Non-smoker.  No exposure to chemicals. - Her daughter Jerel Hummer is a hospitalist APP at [employer] in [place]. - Sister had metastatic kidney cancer and brother had metastatic prostate cancer.  PLAN SUMMARY: >> IV Monoferric  x 1 >> PORT flush / labs in 2 months = CBC/D, ferritin, iron/TIBC >> PHONE visit in 2 months (after labs)     REVIEW OF SYSTEMS:   Review of Systems  Constitutional:  Positive for malaise/fatigue. Negative for chills, diaphoresis, fever and weight loss.  Respiratory:  Negative for cough and shortness of breath.   Cardiovascular:  Negative for chest pain and palpitations.  Gastrointestinal:  Negative for abdominal pain, blood in stool, melena, nausea and vomiting.  Neurological:  Positive for tingling. Negative for dizziness and headaches.  Psychiatric/Behavioral:  The patient has insomnia.      PHYSICAL EXAM: (per limitations of virtual telephone visit)  The patient is alert and oriented x 3, exhibiting adequate mentation, good mood, and ability to speak in full sentences and execute sound judgement.  WRAP UP:   I discussed the assessment and treatment plan with the patient. The patient was provided an opportunity to ask questions and all were answered. The patient agreed with the plan and demonstrated an understanding of the instructions.   The patient was advised to call back or seek an in-person evaluation if the symptoms worsen or if the  condition fails to improve as anticipated.  I provided 22 minutes of non-face-to-face time during this encounter, including >10 minutes of medical discussion.  Pleasant CHRISTELLA Barefoot, PA-C 06/17/24 12:26 PM

## 2024-06-17 ENCOUNTER — Inpatient Hospital Stay: Payer: MEDICARE | Admitting: Physician Assistant

## 2024-06-17 DIAGNOSIS — D500 Iron deficiency anemia secondary to blood loss (chronic): Secondary | ICD-10-CM

## 2024-06-17 DIAGNOSIS — K55069 Acute infarction of intestine, part and extent unspecified: Secondary | ICD-10-CM

## 2024-06-22 ENCOUNTER — Inpatient Hospital Stay: Payer: MEDICARE | Attending: Hematology

## 2024-06-22 ENCOUNTER — Inpatient Hospital Stay: Payer: MEDICARE

## 2024-06-22 VITALS — BP 126/57 | HR 60 | Temp 96.7°F | Resp 18

## 2024-06-22 DIAGNOSIS — D500 Iron deficiency anemia secondary to blood loss (chronic): Secondary | ICD-10-CM | POA: Insufficient documentation

## 2024-06-22 DIAGNOSIS — D509 Iron deficiency anemia, unspecified: Secondary | ICD-10-CM

## 2024-06-22 MED ORDER — SODIUM CHLORIDE 0.9 % IV SOLN
1000.0000 mg | Freq: Once | INTRAVENOUS | Status: AC
  Administered 2024-06-22: 1000 mg via INTRAVENOUS
  Filled 2024-06-22: qty 1000

## 2024-06-22 MED ORDER — ACETAMINOPHEN 325 MG PO TABS: 650.0000 mg | ORAL_TABLET | Freq: Once | ORAL | Status: DC

## 2024-06-22 MED ORDER — ACETAMINOPHEN 325 MG PO TABS
650.0000 mg | ORAL_TABLET | Freq: Once | ORAL | Status: AC
  Administered 2024-06-22: 650 mg via ORAL
  Filled 2024-06-22: qty 2

## 2024-06-22 MED ORDER — SODIUM CHLORIDE 0.9 % IV SOLN
Freq: Once | INTRAVENOUS | Status: AC
Start: 2024-06-22 — End: 2024-06-22

## 2024-06-22 MED ORDER — CETIRIZINE HCL 10 MG/ML IV SOLN
5.0000 mg | Freq: Once | INTRAVENOUS | Status: AC
  Administered 2024-06-22: 5 mg via INTRAVENOUS
  Filled 2024-06-22: qty 1

## 2024-06-22 NOTE — Patient Instructions (Signed)

## 2024-06-22 NOTE — Progress Notes (Signed)
 Patient tolerated iron infusion with no complaints voiced.  Peripheral IV site clean and dry with good blood return noted before and after infusion.  Band aid applied.  VSS with discharge and left in satisfactory condition with no s/s of distress noted.

## 2024-08-11 ENCOUNTER — Ambulatory Visit: Payer: MEDICARE | Attending: Nurse Practitioner | Admitting: Nurse Practitioner

## 2024-08-11 ENCOUNTER — Encounter: Payer: Self-pay | Admitting: Nurse Practitioner

## 2024-08-11 VITALS — BP 116/72 | HR 67 | Ht 65.5 in | Wt 178.8 lb

## 2024-08-11 DIAGNOSIS — I480 Paroxysmal atrial fibrillation: Secondary | ICD-10-CM | POA: Insufficient documentation

## 2024-08-11 DIAGNOSIS — D500 Iron deficiency anemia secondary to blood loss (chronic): Secondary | ICD-10-CM | POA: Diagnosis present

## 2024-08-11 DIAGNOSIS — I5032 Chronic diastolic (congestive) heart failure: Secondary | ICD-10-CM | POA: Diagnosis present

## 2024-08-11 DIAGNOSIS — G4733 Obstructive sleep apnea (adult) (pediatric): Secondary | ICD-10-CM | POA: Insufficient documentation

## 2024-08-11 DIAGNOSIS — Z87898 Personal history of other specified conditions: Secondary | ICD-10-CM | POA: Diagnosis present

## 2024-08-11 DIAGNOSIS — I4892 Unspecified atrial flutter: Secondary | ICD-10-CM | POA: Diagnosis not present

## 2024-08-11 DIAGNOSIS — K55069 Acute infarction of intestine, part and extent unspecified: Secondary | ICD-10-CM | POA: Insufficient documentation

## 2024-08-11 NOTE — Patient Instructions (Addendum)

## 2024-08-11 NOTE — Progress Notes (Unsigned)
 Cardiology Office Note:  .   Date:  08/11/2024 ID:  Rock [name], DOB 01-Jul-1941, MRN [id number] PCP: Diedra Senior, MD  [facility] Providers Cardiologist:  Alvan Carrier, MD    History of Present Illness: .   Cristina Santos is a 83 y.o. female with a PMH of HFpEF, chest pain/DOE, hx of NASH, liver cirrhosis, hx of blood loss anemia, iron deficiency (sees Heme/Onc), OSA, thrombocytopenia, and hx of superior mesenteric vein thrombosis, who presents to the office today for follow-up.   Last seen by Dr. Carrier Alvan on Apr 10, 2023. Evaluated for CP/DOE. Echocardiogram report noted below from 03/2023. Noted some signs of volume overload on exam, started Lasix  20 mg daily. NST was declined at the time by patient.   Hospitalized 04/2023 for acute on chronic HFpEF and hemoccult positive. Echo revealed normal EF. Received IV Lasix . Hospital course noted by PAF/A-flutter on EKG, wore Zio monitor at time of d/c. Xarelto  held in setting of blood loss anemia. Underwent EGD/colonoscopy - see report below.   08/26/2023 - Today she presents for follow-up. Shows me her HR and BP log that shows stable blood pressure readings, heart rates overall well-controlled, 2 readings in mid to upper 40's for HR and some readings in 50's.  Patient says that she feels sleepy/tired when her heart rate does get this low.  Continues to get iron infusions.  Admits to one-time episode of sharp chest pain radiating from left side of her chest to right side of her chest, drink some soda water and symptoms went away within 1 hour, denies any triggers or exertional component. Denies any shortness of breath, palpitations, syncope, presyncope, dizziness, orthopnea, PND, swelling or significant weight changes, acute bleeding, or claudication.  Our office was contacted after office visit, noting tachycardia. Kardia reflected intermittent A-fib, was wanting to see if she could return to beta blocker. Nadolol  restarted at 10 mg  daily.   11/22/2023 - Today she presents for follow-up. Doing well. Denies any recent acute cardiac issues or complaints. Denies any chest pain, shortness of breath, palpitations, syncope, presyncope, dizziness, orthopnea, PND, swelling or significant weight changes, acute bleeding, or claudication.  08/11/2024 - Here for follow-up. Doing well. Currently taking Aldactone  50 mg around every other day, tolerating her medications well. Denies any chest pain, shortness of breath, palpitations, syncope, presyncope, dizziness, orthopnea, PND, swelling or significant weight changes, acute bleeding, or claudication.  SH: Daughter works as NP with experience ED, recently has been promoted.   Studies Reviewed: SABRA   EKG Interpretation Date/Time:  Tuesday August 11 2024 15:11:28 EDT Ventricular Rate:  67 PR Interval:    QRS Duration:  78 QT Interval:  430 QTC Calculation: 454 R Axis:   100  Text Interpretation: Accelerated Junctional rhythm Rightward axis When compared with ECG of 05-May-2023 11:26, Junctional rhythm has replaced Sinus rhythm Nonspecific T wave abnormality now evident in Anterior leads Confirmed by Miriam Norris [phone]) on 08/11/2024 3:14:33 PM    EKG: EKG Interpretation Date/Time:  Tuesday August 11 2024 15:11:28 EDT Ventricular Rate:  67 PR Interval:    QRS Duration:  78 QT Interval:  430 QTC Calculation: 454 R Axis:   100  Text Interpretation: Accelerated Junctional rhythm Rightward axis When compared with ECG of 05-May-2023 11:26, Junctional rhythm has replaced Sinus rhythm Nonspecific T wave abnormality now evident in Anterior leads Confirmed by Miriam Norris [phone]) on 08/11/2024 3:14:33 PM    Cardiac monitor 05/2023:   13 day monitor   Frequent  supraventricular ectopy in the form of isolated PACs. Occasional couplets, triplets. 118 episodes of SVT, the longest 6 beats.   Rare ventricular ectopy in the form of PVCs   No symptoms reported     Patch Wear Time:  13  days and 9 hours (2024-06-17T21:50:00-0400 to 2024-07-01T07:35:43-0400)   Patient had a min HR of 40 bpm, max HR of 148 bpm, and avg HR of 60 bpm. Predominant underlying rhythm was Sinus Rhythm. First Degree AV Block was present.118 Supraventricular Tachycardia runs occurred, the run with the fastest interval lasting 5 beats  with a max rate of 148 bpm, the longest lasting 6 beats with an avg rate of 93 bpm. Isolated SVEs were frequent (5.2%, 59340), SVE Couplets were occasional (3.4%, 19534), and SVE Triplets were occasional (1.8%, 6900). Isolated VEs were rare (<1.0%), and  no VE Couplets or VE Triplets were present.  05/06/23 colonoscopy--9 nonbleeding colon angioectasias tx'ed with APC; rectal varices; transverse and ascending colon polyps -05/06/23 EGD--grade 1 esophageal varices; portal HTN gastropathy, +gastric polyp  Limited Echo 04/2023:  1. Limited echo no color/doppler of valves done.   2. Left ventricular ejection fraction, by estimation, is 60 to 65%. The  left ventricle has normal function.   3. Left atrial size was mildly dilated.   4. The mitral valve is abnormal. Moderate mitral annular calcification.   5. The aortic valve is tricuspid. There is moderate calcification of the  aortic valve. There is moderate thickening of the aortic valve. Aortic  valve sclerosis/calcification is present, without any evidence of aortic  stenosis.   6. The inferior vena cava is normal in size with <50% respiratory  variability, suggesting right atrial pressure of 8 mmHg.    Physical Exam:   VS:  BP 116/72   Pulse 67   Ht 5' 5.5 (1.664 m)   Wt 178 lb 12.8 oz (81.1 kg)   SpO2 98%   BMI 29.30 kg/m    Wt Readings from Last 3 Encounters:  08/11/24 178 lb 12.8 oz (81.1 kg)  03/16/24 189 lb (85.7 kg)  12/23/23 192 lb 14.4 oz (87.5 kg)    GEN: Well nourished, well developed in no acute distress NECK: No JVD; No carotid bruits CARDIAC: S1/S2, RRR, no murmurs, rubs, gallops RESPIRATORY:   Clear to auscultation without rales, wheezing or rhonchi  ABDOMEN: Soft, non-tender, non-distended EXTREMITIES:  No edema to BLE; No deformity   ASSESSMENT AND PLAN: .    Chronic HFpEF Stage C, NYHA class I-II symptoms. Weight is stable. EF 60-65%. No signs of volume overload on exam. Continue current medication regimen. Low sodium diet, fluid restriction <2L, and daily weights encouraged. Educated to contact our office for weight gain of 2 lbs overnight or 5 lbs in one week.   PAF/A-flutter, hx of bradycardia Denies any tachycardia or palpitations.  HR is well controlled while on nadolol  10 mg daily, had bradycardia at 20 mg daily. Past monitor report noted above.  Denies any recent bleeding while on Xarelto .  Continue Xarelto  for stroke prevention and she is on correct Xarelto  dosage. Heart healthy diet and regular cardiovascular exercise encouraged. Compliant with her CPAP usage.   Chronic SMV thrombosis Denies any symptoms. Continue xarelto  and continue to follow-up with Hem/Onc and GI.   Iron deficiency anemia d/t chronic blood loss Last Hgb stable at 10.0. Denies any bleeding issues. Continue to follow-up with Hem/Onc, receiving iron transfusions.   5. OSA on CPAP Encouraged continued compliance.  I spent a total duration of  30 minutes reviewing prior notes, reviewing outside records including  labs, EKG today, face-to-face counseling of medical condition, pathophysiology, evaluation, management, and documenting the findings in the note.   Dispo: Follow-up with Dr. Alvan or APP in 6 months or sooner if anything changes.   Signed, Almarie Crate, NP

## 2024-08-18 ENCOUNTER — Inpatient Hospital Stay: Payer: MEDICARE

## 2024-08-19 ENCOUNTER — Inpatient Hospital Stay: Payer: MEDICARE | Attending: Hematology

## 2024-08-19 DIAGNOSIS — D500 Iron deficiency anemia secondary to blood loss (chronic): Secondary | ICD-10-CM | POA: Insufficient documentation

## 2024-08-19 DIAGNOSIS — Z7901 Long term (current) use of anticoagulants: Secondary | ICD-10-CM | POA: Diagnosis not present

## 2024-08-19 LAB — CBC WITH DIFFERENTIAL/PLATELET
Abs Immature Granulocytes: 0.01 K/uL (ref 0.00–0.07)
Basophils Absolute: 0 K/uL (ref 0.0–0.1)
Basophils Relative: 0 %
Eosinophils Absolute: 0.1 K/uL (ref 0.0–0.5)
Eosinophils Relative: 4 %
HCT: 33.7 % — ABNORMAL LOW (ref 36.0–46.0)
Hemoglobin: 10.6 g/dL — ABNORMAL LOW (ref 12.0–15.0)
Immature Granulocytes: 0 %
Lymphocytes Relative: 17 %
Lymphs Abs: 0.4 K/uL — ABNORMAL LOW (ref 0.7–4.0)
MCH: 28.6 pg (ref 26.0–34.0)
MCHC: 31.5 g/dL (ref 30.0–36.0)
MCV: 91.1 fL (ref 80.0–100.0)
Monocytes Absolute: 0.2 K/uL (ref 0.1–1.0)
Monocytes Relative: 6 %
Neutro Abs: 1.8 K/uL (ref 1.7–7.7)
Neutrophils Relative %: 73 %
Platelets: 85 K/uL — ABNORMAL LOW (ref 150–400)
RBC: 3.7 MIL/uL — ABNORMAL LOW (ref 3.87–5.11)
RDW: 16.2 % — ABNORMAL HIGH (ref 11.5–15.5)
WBC: 2.5 K/uL — ABNORMAL LOW (ref 4.0–10.5)
nRBC: 0 % (ref 0.0–0.2)

## 2024-08-19 LAB — FERRITIN: Ferritin: 28 ng/mL (ref 11–307)

## 2024-08-19 LAB — IRON AND TIBC
Iron: 59 ug/dL (ref 28–170)
Saturation Ratios: 19 % (ref 10.4–31.8)
TIBC: 311 ug/dL (ref 250–450)
UIBC: 252 ug/dL

## 2024-08-19 NOTE — Progress Notes (Signed)
 Patients port flushed without difficulty.  Good blood return noted with no bruising or swelling noted at site.  Labs drawn per orders.  VSS with discharge and left in satisfactory condition with no s/s of distress noted. All follow ups as scheduled.       Kierre Deines

## 2024-08-20 ENCOUNTER — Inpatient Hospital Stay: Payer: MEDICARE

## 2024-08-24 NOTE — Progress Notes (Unsigned)
 [facility] 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   She was last evaluated via telemedicine visit by Cristina Barefoot PA-C on 06/17/2024. She had IV Monoferric  on 06/22/2024.  At today's visit, she reports feeling fairly well, apart from some difficulty sleeping. She felt some improved energy after her last IV iron. She has had some recurrent fatigue over the past few weeks. She denies any abnormal headaches, chest pain, dyspnea on exertion, lightheadedness, or syncope. She has not noticed any melena, hematochezia or other signs of overt gastrointestinal bleeding. She denies any associated nausea, vomiting, abdominal pain. She continues to take Xarelto  for her atrial fibrillation; was previously on Xarelto  for SMV thrombus, but this has resolved. She continues to take daily iron tablet. She has 80% energy and 75% appetite.  She endorses that she is maintaining a stable weight.  ASSESSMENT & PLAN:  1.  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  B12 and folic acid normal. - NASH cirrhosis, low-grade esophageal and colonic/rectal varices. - Colonoscopy (05/06/2023): Rectal varices, diverticulosis in the sigmoid colon.  9 nonbleeding colonic angiodysplastic lesions, treated with APC. - EGD (05/06/2023): Grade 1 esophageal varices, portal hypertensive gastropathy, gastritis.  Multiple gastric polyps and normal duodenum. - Most recent IV iron with Monoferric  1 g on 06/22/2024 - She is taking iron  tablet daily.   - Most recent labs (08/19/2024): Hgb 10.6/MCV 91.1.  Ferritin 28, iron saturation 19% - She denies any obvious rectal bleeding or melena. - Symptomatic with fatigue, previously improved after IV iron - PLAN: IV Monoferric  x 1. - We will check kidney function test (creatinine and Cystatin C) to see if she also has a component of CKD that could be contributing to her anemia - Labs only in 2 months = CBC/D, ferritin, iron/TIBC, Cystatin C, CMP - Labs in 4 months = CBC/D, ferritin, iron/TIBC - OFFICE visit in 4 months (same day as labs)  - She remains at risk for ongoing GI bleeding, therefore we will continue close monitoring of CBC and iron levels. - Continue GI follow-up (last seen by Dr. Avram on 03/16/2024)  2.  Superior mesenteric vein thrombus: - MRI (12/10/2022): Eccentric filling defect in the SMV is persistent across many sequences suspicious for nonocclusive thrombus just below the splenic portal confluence. 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  No filling defects and portal vein branches. - CT venogram abdomen/pelvis (11/19/2023): Resolution of prior SMV thrombus, now with only minor chronic wall thickening/scarring and no evidence of recurrent thrombus, stenosis, or occlusion.  She does have stigmata of clinically significant portal hypertension and cirrhosis. - Most recent D-dimer (11/19/2023) elevated at 1.00, but same day CT venogram was negative for SMV thrombus - Although SMV thrombus has resolved, she remains on Xarelto  due to atrial fibrillation  3.  History of [facility] colon cancer: - Underwent surgical resection in 1988,  received leucovorin and 5-FU for a year as part of clinical trial at [facility]  She is still has a functioning port since then. - PLAN: Port flush twice  yearly.   - Prefers peripheral lab draw for routine labs. - No indication for further labs (CEA) or imaging at this time.  4.  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  Would consider BMBx if she has cytopenias that are worsened to her liver disease and splenomegaly.  5.  Social/family history: - She lives by herself and is independent of ADLs and IADLs.  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  Non-smoker.  No exposure to chemicals. - Her daughter Cristina Santos is a hospitalist APP at [employer] in [place]. - Sister had metastatic kidney cancer and brother had metastatic prostate cancer.  PLAN SUMMARY: >> IV Monoferric  x 1 >> PORT flush / labs only in 2 months = CBC/D, ferritin, iron/TIBC, CMP, Cystatin C  >> Same-day PORT flush / labs (CBC/D, ferritin, iron/TIBC) + OFFICE visit in 4 months     REVIEW OF SYSTEMS:   Review of Systems  Constitutional:  Positive for fatigue (mild). Negative for appetite change, chills, diaphoresis, fever and unexpected weight change.  HENT:   Negative for lump/mass and nosebleeds.   Eyes:  Negative for eye problems.  Respiratory:  Positive for cough (at times). Negative for hemoptysis and shortness of breath.   Cardiovascular:  Negative for chest pain, leg swelling and palpitations.  Gastrointestinal:  Negative for abdominal pain, blood in stool, constipation, diarrhea, nausea and vomiting.  Genitourinary:  Negative for hematuria.   Skin: Negative.   Neurological:  Negative for dizziness, headaches and light-headedness.  Hematological:  Does not bruise/bleed easily.  Psychiatric/Behavioral:  Positive for sleep disturbance.      PHYSICAL EXAM:  ECOG PERFORMANCE STATUS: 1 - Symptomatic  but completely ambulatory  Vitals:   08/25/24 0806  BP: 119/69  Pulse: 62  Resp: 18  Temp: 97.8 F (36.6 C)  SpO2: 97%    Filed Weights   08/25/24 0806  Weight: 185 lb (83.9 kg)    Physical Exam Constitutional:      Appearance: Normal appearance. She is obese.  Cardiovascular:     Heart sounds: Normal heart sounds.  Pulmonary:     Breath sounds: Normal breath sounds.  Neurological:     General: No focal deficit present.     Mental Status: Mental status is at baseline.  Psychiatric:        Behavior: Behavior normal. Behavior is cooperative.     PAST MEDICAL/SURGICAL HISTORY:  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 10/09/2020   Pancreatic cysts 10/09/2020   Portal hypertension (HCC)-gastropathy and colopathy 10/09/2020   Sleep apnea    Thrombosis of mesenteric vein (HCC) SMV    SMV   Thyroid disease    Vestibular migraine    Past Surgical History:  Procedure Laterality Date   ABDOMINAL HYSTERECTOMY  1987   BIOPSY  05/06/2023   Procedure: BIOPSY;  Surgeon: Cindie Carlin POUR, DO;  [facility]: [facility];  Service: Endoscopy;;   COLON RESECTION     COLONOSCOPY     COLONOSCOPY WITH PROPOFOL  N/A 05/06/2023   Procedure: COLONOSCOPY WITH PROPOFOL ;  Surgeon: Cindie Carlin POUR, DO;  [facility]: [facility];  Service: Endoscopy;  Laterality: N/A;   ESOPHAGOGASTRODUODENOSCOPY (EGD) WITH PROPOFOL  N/A 05/06/2023   Procedure: ESOPHAGOGASTRODUODENOSCOPY (EGD) WITH PROPOFOL ;  Surgeon: Cindie Carlin POUR, DO;  [facility]: [facility];  Service: Endoscopy;  Laterality: N/A;   HOT HEMOSTASIS  05/06/2023   Procedure: HOT HEMOSTASIS (ARGON PLASMA COAGULATION/BICAP);   Surgeon: Cindie Carlin POUR, DO;  Location: [facility];  Service: Endoscopy;;   POLYPECTOMY  05/06/2023   Procedure: POLYPECTOMY;  Surgeon: Cindie Carlin POUR, DO;  [facility]: [facility];  Service: Endoscopy;;   PORTACATH PLACEMENT     Never removed placed at time of colon cancer treatment   SHOULDER ARTHROSCOPY     left   TOTAL KNEE ARTHROPLASTY Left 06/08/2019   Procedure: TOTAL KNEE ARTHROPLASTY;  Surgeon: Melodi Lerner, MD;  Location: [facility];  Service: Orthopedics;  Laterality: Left;    UPPER GASTROINTESTINAL ENDOSCOPY      SOCIAL HISTORY:  Social History   Socioeconomic History   Marital status: Widowed    Spouse name: Not on file   Number of children: 2   Years of education: Not on file   Highest education level: Not on file  Occupational History   Occupation: retired  Tobacco Use   Smoking status: Never   Smokeless tobacco: Never  Vaping Use   Vaping status: Never Used  Substance and Sexual Activity   Alcohol use: No   Drug use: No   Sexual activity: Not Currently  Other Topics Concern   Not on file  Social History Narrative   Married, 1 son one daughter. Daughter is a Publishing rights manager.   She is retired   2 cups coffee per day   Social Drivers of Corporate investment banker Strain: Not on [employer] Insecurity: Not on file  Transportation Needs: Not on file  Physical Activity: Not on file  Stress: Not on file  Social Connections: Not on file  Intimate Partner Violence: Not on file    FAMILY HISTORY:  Family History  Problem Relation Age of Onset   Prostate cancer Brother    Lung cancer Brother    Heart disease Brother    Bone cancer Brother    Diabetes Mother    Alzheimer's disease Mother    Diabetes Brother    Heart attack Brother    Lung cancer Brother    Bone cancer Brother    Lung cancer Sister        with Estate manager/land agent    Ulcerative colitis Sister    Colon cancer Neg Hx    Esophageal cancer Neg Hx    Stomach cancer Neg Hx    Pancreatic  cancer Neg Hx    AAA (abdominal aortic aneurysm) Neg Hx     CURRENT MEDICATIONS:  Outpatient Encounter Medications as of 08/25/2024  Medication Sig Note   albuterol  (VENTOLIN  HFA) 108 (90 Base) MCG/ACT inhaler Inhale 1 puff into the lungs as needed. 03/01/2023: On hand   ascorbic acid (VITAMIN C) 500 MG tablet Take 500 mg by mouth daily.    Biotin 1000 MCG tablet Take 1,000 mcg by mouth daily.    Cholecalciferol 50 MCG (2000 UT) TABS Take 2,000 Units by  mouth daily.    diphenhydrAMINE -APAP, sleep, (TYLENOL  PM EXTRA STRENGTH PO) Take 1 tablet by mouth as needed.    empagliflozin  (JARDIANCE ) 10 MG TABS tablet TAKE 1 TABLET BY MOUTH ONCE DAILY BEFORE BREAKFAST    ferrous sulfate  325 (65 FE) MG tablet Take 1 tablet (325 mg total) by mouth 2 (two) times daily with a meal. (Patient taking differently: Take 325 mg by mouth daily with breakfast.)    furosemide  (LASIX ) 40 MG tablet Take 1 tablet (40 mg total) by mouth daily.    hydrOXYzine (ATARAX) 25 MG tablet Take 25 mg by mouth at bedtime.    lactulose  (CHRONULAC ) 10 GM/15ML solution Take 10 g by mouth as needed. 10/07/2023: As needed   levothyroxine  (SYNTHROID ) 75 MCG tablet Take 75 mcg by mouth daily before breakfast.     lidocaine -prilocaine  (EMLA ) cream Apply 1 Application topically as needed. Apply to skin over port 30 minutes before port flush lab draws 10/07/2023: As needed   magnesium  30 MG tablet Take 400 mg by mouth daily.    melatonin 5 MG TABS Take 5 mg by mouth at bedtime.    Multiple Vitamins-Minerals (PRESERVISION AREDS PO) Take 1 tablet by mouth daily.    nadolol  (CORGARD ) 20 MG tablet Take 0.5 tablets (10 mg total) by mouth daily.    pantoprazole  (PROTONIX ) 40 MG tablet Take 1 tablet (40 mg total) by mouth 2 (two) times daily before a meal.    potassium chloride  SA (KLOR-CON  M) 20 MEQ tablet Take 1 tablet (20 mEq total) by mouth daily.    pramipexole  (MIRAPEX ) 0.75 MG tablet Take 0.75 mg by mouth every evening.    spironolactone   (ALDACTONE ) 50 MG tablet Take 50 mg by mouth every other day.    triamcinolone cream (KENALOG) 0.1 % Apply topically.    vitamin B-12 (CYANOCOBALAMIN) 1000 MCG tablet Take 1,000 mcg by mouth daily.    [DISCONTINUED] XARELTO  20 MG TABS tablet Take 1 tablet (20 mg total) by mouth daily. Restart on 05/09/23    XARELTO  20 MG TABS tablet Take 1 tablet (20 mg total) by mouth daily. Restart on 05/09/23    No facility-administered encounter medications on file as of 08/25/2024.    ALLERGIES:  Allergies  Allergen Reactions   Latex Anaphylaxis    Other Reaction(s): Not available  latex   Codeine Palpitations    Other Reaction(s): Not available  codeine    LABORATORY DATA:  I have reviewed the labs as listed.  CBC    Component Value Date/Time   WBC 2.5 (L) 08/19/2024 1007   RBC 3.70 (L) 08/19/2024 1007   HGB 10.6 (L) 08/19/2024 1007   HCT 33.7 (L) 08/19/2024 1007   PLT 85 (L) 08/19/2024 1007   MCV 91.1 08/19/2024 1007   MCH 28.6 08/19/2024 1007   MCHC 31.5 08/19/2024 1007   RDW 16.2 (H) 08/19/2024 1007   LYMPHSABS 0.4 (L) 08/19/2024 1007   MONOABS 0.2 08/19/2024 1007   EOSABS 0.1 08/19/2024 1007   BASOSABS 0.0 08/19/2024 1007      Latest Ref Rng & Units 03/16/2024   10:28 AM 11/19/2023   10:07 AM 10/07/2023    9:07 AM  CMP  Glucose 70 - 99 mg/dL 86   82   BUN 6 - 23 mg/dL 21   16   Creatinine 9.59 - 1.20 mg/dL 9.05  8.99  9.14   Sodium 135 - 145 mEq/L 138   134   Potassium 3.5 - 5.1 mEq/L 4.1   4.1  Chloride 96 - 112 mEq/L 102   98   CO2 19 - 32 mEq/L 30   31   Calcium 8.4 - 10.5 mg/dL 9.6   9.7   Total Protein 6.0 - 8.3 g/dL 6.6   6.7   Total Bilirubin 0.2 - 1.2 mg/dL 0.9   1.2   Alkaline Phos 39 - 117 U/L 69   86   AST 0 - 37 U/L 25   20   ALT 0 - 35 U/L 22   15     DIAGNOSTIC IMAGING:  I have independently reviewed the relevant imaging and discussed with the patient.   WRAP UP:  All questions were answered. The patient knows to call the clinic with any  problems, questions or concerns.  Medical decision making: Moderate  Time spent on visit: I spent 20 minutes counseling the patient face to face. The total time spent in the appointment was 30 minutes and more than 50% was on counseling.  Cristina CHRISTELLA Barefoot, PA-C  08/25/24 8:55 AM

## 2024-08-25 ENCOUNTER — Inpatient Hospital Stay: Payer: MEDICARE | Admitting: Physician Assistant

## 2024-08-25 VITALS — BP 119/69 | HR 62 | Temp 97.8°F | Resp 18 | Wt 185.0 lb

## 2024-08-25 DIAGNOSIS — D500 Iron deficiency anemia secondary to blood loss (chronic): Secondary | ICD-10-CM

## 2024-08-25 DIAGNOSIS — K55069 Acute infarction of intestine, part and extent unspecified: Secondary | ICD-10-CM | POA: Diagnosis not present

## 2024-08-25 MED ORDER — XARELTO 20 MG PO TABS: 20.0000 mg | ORAL_TABLET | Freq: Every day | ORAL | 3 refills | Status: AC

## 2024-08-25 NOTE — Patient Instructions (Signed)
 [facility] at [facility] **VISIT SUMMARY & IMPORTANT INSTRUCTIONS **   You were seen today by Pleasant Barefoot PA-C for your follow-up visit.    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 Your blood and iron levels are both low. This is most likely due to slow/intermittent blood loss from your stomach or intestines. We will schedule you for IV iron x 1 dose. Seek immediate medical attention if you have worsening symptoms of anemia such as profound weakness, chest pain, lightheadedness, passing out, or difficulty breathing. Continue to take your over-the-counter iron supplement (ferrous sulfate  325 mg) once daily.  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  Your most recent CT scan showed that this blood clot has resolved! From hematology standpoint, you no longer need Xarelto  for treatment of this blood clot. However, you should CONTINUE to take Xarelto  for the time being, since you may need to be on blood thinner from your cardiologist (due to atrial fibrillation). Pay close attention for any signs or symptoms of bleeding.  LOW PLATELETS & WHITE BLOOD CELLS: Your low platelets and low white blood cells are related to your liver cirrhosis and enlarged spleen.  At this time, your levels are within an expected range for your liver disease.  If you have severe worsening of platelets and white blood cells beyond what is expected from liver disease, we would consider bone marrow biopsy to rule out other conditions.  FOLLOW-UP APPOINTMENT: - Labs only in 2 months.  (We will call you to go over these labs and let you know if you need any IV iron.) - Labs and office visit in 4 months.  ** Thank you for trusting me with your healthcare!  I strive to provide all of my patients with quality care at each visit.  If you  receive a survey for this visit, I would be so grateful to you for taking the time to provide feedback.  Thank you in advance!  ~ Steph Cheadle                                        Dr. Mickiel Davonna Pleasant Barefoot, PA-C   Delon Hope, NP   - - - - - - - - - - - - - - - - - -     Thank you for choosing [facility] at [facility] to provide your oncology and hematology care.  To afford each patient quality time with our provider, please arrive at least 15 minutes before your scheduled appointment time.   If you have a lab appointment with the Cancer Center please come in thru the Main Entrance and check in at the main information desk.  You need to re-schedule your appointment should you arrive 10 or more minutes late.  We strive to give you quality time with our providers, and arriving late affects you and other patients whose appointments are after yours.  Also, if you no show three or more times for appointments you may be dismissed from the clinic at the providers discretion.     Again, thank you for choosing [facility]  Our hope is that these requests will decrease the amount of time that you wait  before being seen by our physicians.       _____________________________________________________________  Should you have questions after your visit to [facility], please contact our office at [phone] and follow the prompts.  Our office hours are 8:00 a.m. and 4:30 p.m. Monday - Friday.  Please note that voicemails left after 4:00 p.m. may not be returned until the following business day.  We are closed weekends and major holidays.  You do have access to a nurse 24-7, just call the main number to the clinic [phone] and do not press any options, hold on the line and a nurse will answer the phone.    For prescription refill requests, have your pharmacy contact our office and allow 72 hours.

## 2024-08-26 ENCOUNTER — Inpatient Hospital Stay: Payer: MEDICARE

## 2024-08-26 VITALS — BP 115/50 | HR 53 | Temp 97.5°F | Resp 17

## 2024-08-26 DIAGNOSIS — D500 Iron deficiency anemia secondary to blood loss (chronic): Secondary | ICD-10-CM | POA: Diagnosis not present

## 2024-08-26 DIAGNOSIS — D509 Iron deficiency anemia, unspecified: Secondary | ICD-10-CM

## 2024-08-26 MED ORDER — SODIUM CHLORIDE 0.9 % IV SOLN
1000.0000 mg | Freq: Once | INTRAVENOUS | Status: AC
  Administered 2024-08-26: 1000 mg via INTRAVENOUS
  Filled 2024-08-26: qty 10

## 2024-08-26 MED ORDER — SODIUM CHLORIDE 0.9 % IV SOLN: Freq: Once | INTRAVENOUS | Status: AC

## 2024-08-26 NOTE — Progress Notes (Signed)
 Patient presents today for iron infusion.  Patient is in satisfactory condition with no new complaints voiced.  Vital signs are stable.  We will proceed with infusion per provider orders.    Patient took pre-meds at home prior to arrival.  Monoferric  1,000 mg given today per MD orders. Tolerated infusion without adverse affects. Vital signs stable. No complaints at this time. Discharged from clinic ambulatory in stable condition. Alert and oriented x 3. F/U with [facility] as scheduled.

## 2024-08-26 NOTE — Patient Instructions (Signed)
 [facility] [place] - A DEPT OF [facility]  Discharge Instructions: Thank you for choosing [facility] to provide your oncology and hematology care.  If you have a lab appointment with the Cancer Center - please note that after April 8th, 2024, all labs will be drawn in the cancer center.  You do not have to check in or register with the main entrance as you have in the past but will complete your check-in in the cancer center.  Wear comfortable clothing and clothing appropriate for easy access to any Portacath or PICC line.   We strive to give you quality time with your provider. You may need to reschedule your appointment if you arrive late (15 or more minutes).  Arriving late affects you and other patients whose appointments are after yours.  Also, if you miss three or more appointments without notifying the office, you may be dismissed from the clinic at the provider's discretion.      For prescription refill requests, have your pharmacy contact our office and allow 72 hours for refills to be completed.    Today you received Monoferric IV iron infusion.     BELOW ARE SYMPTOMS THAT SHOULD BE REPORTED IMMEDIATELY: *FEVER GREATER THAN 100.4 F (38 C) OR HIGHER *CHILLS OR SWEATING *NAUSEA AND VOMITING THAT IS NOT CONTROLLED WITH YOUR NAUSEA MEDICATION *UNUSUAL SHORTNESS OF BREATH *UNUSUAL BRUISING OR BLEEDING *URINARY PROBLEMS (pain or burning when urinating, or frequent urination) *BOWEL PROBLEMS (unusual diarrhea, constipation, pain near the anus) TENDERNESS IN MOUTH AND THROAT WITH OR WITHOUT PRESENCE OF ULCERS (sore throat, sores in mouth, or a toothache) UNUSUAL RASH, SWELLING OR PAIN  UNUSUAL VAGINAL DISCHARGE OR ITCHING   Items with * indicate a potential emergency and should be followed up as soon as possible or go to the Emergency Department if any problems should occur.  Please show the CHEMOTHERAPY ALERT CARD or IMMUNOTHERAPY ALERT CARD  at check-in to the Emergency Department and triage nurse.  Should you have questions after your visit or need to cancel or reschedule your appointment, please contact [facility] [place] - A DEPT OF Eligha Bridegroom [facility] [phone]  and follow the prompts.  Office hours are 8:00 a.m. to 4:30 p.m. Monday - Friday. Please note that voicemails left after 4:00 p.m. may not be returned until the following business day.  We are closed weekends and major holidays. You have access to a nurse at all times for urgent questions. Please call the main number to the clinic [phone] and follow the prompts.  For any non-urgent questions, you may also contact your provider using MyChart. We now offer e-Visits for anyone 35 and older to request care online for non-urgent symptoms. For details visit [email or web address].   Also download the MyChart app! Go to the app store, search "MyChart", open the app, select [place], and log in with your MyChart username and password.

## 2024-10-02 ENCOUNTER — Other Ambulatory Visit (INDEPENDENT_AMBULATORY_CARE_PROVIDER_SITE_OTHER): Payer: MEDICARE

## 2024-10-02 ENCOUNTER — Encounter: Payer: Self-pay | Admitting: Internal Medicine

## 2024-10-02 ENCOUNTER — Ambulatory Visit: Payer: MEDICARE | Admitting: Internal Medicine

## 2024-10-02 VITALS — BP 112/60 | HR 68 | Ht 65.5 in | Wt 178.0 lb

## 2024-10-02 DIAGNOSIS — R1013 Epigastric pain: Secondary | ICD-10-CM

## 2024-10-02 DIAGNOSIS — K766 Portal hypertension: Secondary | ICD-10-CM

## 2024-10-02 DIAGNOSIS — R55 Syncope and collapse: Secondary | ICD-10-CM

## 2024-10-02 DIAGNOSIS — K55069 Acute infarction of intestine, part and extent unspecified: Secondary | ICD-10-CM

## 2024-10-02 DIAGNOSIS — K7581 Nonalcoholic steatohepatitis (NASH): Secondary | ICD-10-CM

## 2024-10-02 DIAGNOSIS — D500 Iron deficiency anemia secondary to blood loss (chronic): Secondary | ICD-10-CM

## 2024-10-02 DIAGNOSIS — K7469 Other cirrhosis of liver: Secondary | ICD-10-CM | POA: Diagnosis not present

## 2024-10-02 DIAGNOSIS — K862 Cyst of pancreas: Secondary | ICD-10-CM

## 2024-10-02 DIAGNOSIS — K551 Chronic vascular disorders of intestine: Secondary | ICD-10-CM

## 2024-10-02 DIAGNOSIS — I4892 Unspecified atrial flutter: Secondary | ICD-10-CM

## 2024-10-02 LAB — COMPREHENSIVE METABOLIC PANEL WITH GFR
ALT: 21 U/L (ref 0–35)
AST: 20 U/L (ref 0–37)
Albumin: 4.1 g/dL (ref 3.5–5.2)
Alkaline Phosphatase: 88 U/L (ref 39–117)
BUN: 25 mg/dL — ABNORMAL HIGH (ref 6–23)
CO2: 33 meq/L — ABNORMAL HIGH (ref 19–32)
Calcium: 9.2 mg/dL (ref 8.4–10.5)
Chloride: 101 meq/L (ref 96–112)
Creatinine, Ser: 0.91 mg/dL (ref 0.40–1.20)
GFR: 57.7 mL/min — ABNORMAL LOW (ref >60.00)
Glucose, Bld: 170 mg/dL — ABNORMAL HIGH (ref 70–99)
Potassium: 4.1 meq/L (ref 3.5–5.1)
Sodium: 138 meq/L (ref 135–145)
Total Bilirubin: 0.8 mg/dL (ref 0.2–1.2)
Total Protein: 6.6 g/dL (ref 6.0–8.3)

## 2024-10-02 MED ORDER — FERROUS SULFATE 325 (65 FE) MG PO TABS: 325.0000 mg | ORAL_TABLET | Freq: Every day | ORAL | Status: AC

## 2024-10-02 NOTE — Progress Notes (Signed)
 KEMIAH Santos 83 y.o. January 31, 1941 [id number]  Assessment & Plan:   Encounter Diagnoses  Name Primary?   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 - Consider upper endoscopy if this persists and MRI is unrevealing - Less inclined to pursue a colonoscopy given recent colonoscopy exam even though she has had some bowel habit changes.  Some of this sounds like possible pancreatic insufficiency issues though it is transient and she does not have consistent changes of possible stearrhea.  Consider fecal elastase testing.   Chronic iron deficiency anemia likely due to portal hypertensive gastropathy and colopathy exacerbated by Xarelto  which is necessary for A-fib and less likely for prior SMV thrombosis - on 10 mg Nadolol  - cannot tolerate higher doses and given near-syncope would not push this - Continue to see hematology and receive parenteral iron support   Episodes of lightheadedness and near-syncope Intermittent lightheadedness possibly due to hypoglycemia, cardiac issues, or medication effects. - Check blood sugar and blood pressure during episodes. - May need to see cardiology or have jardiance  stopped/adjusted - do not think liver function is such that she is having hypoglycemia but possible - needs to eat more frequently and have a nighttime snack - instructions provided -Keep plan to follow-up with PCP next month     Orders Placed This Encounter  Procedures   MR Abdomen W Wo Contrast   Comprehensive metabolic panel with GFR   AFP tumor marker   Not  checking INR due to Xarelto  use   CC: Diedra Senior, MD   Subjective:  Gastroenterology summary   MASH cirrhosis with portal gastropathy, 1+ varices and portal colopathy, trace ascites HAV immune HBV vaccinated On nadolol  10 mg daily   Chronic blood loss anemia/iron deficiency anemia thought related to the gastropathy and colopathy in the setting of Eliquis therapy EGD and colonoscopy June 2024 at [facility] admission   05/06/23 colonoscopy--9 nonbleeding colon angioectasias tx'ed with APC; rectal varices; transverse and ascending colon polyps -05/06/23 EGD--grade 1 esophageal varices; portal HTN gastropathy, +gastric polyp A. STOMACH, BIOPSY:  - Oxyntic mucosa with mild chronic inflammation and few ectatic vessels  -H. pylori negative  B. COLON, ASCENDING, TRANSVERSE, POLYPECTOMY:   - Tubular adenoma (1 fragment)  - Hyperplastic polyp (1 fragment)  - Negative for high-grade dysplasia or malignancy    Remote history of colon cancer 1988     Superior mesenteric vein thrombosis diagnosed February 2024-on Eliquis   Cystic lesions of the pancreas thought benign last imaged MAY 2025 MR,    --------------------------------------------------------------------------------------- Chief Complaint: Follow-up of cirrhosis, having epigastric pain and altered bowel habits  HPI Discussed the use of AI scribe software for clinical note transcription with the patient, who gave verbal consent to proceed.   Cristina Santos is an 83 year old female with MASH related cirrhosis and a history of SMV thrombosis plus atrial fibrillation who presents for routine follow-up with complaints of bowel irregularities and epigastric pain.  She was last seen here March 16, 2024. MRI with and  without contrast 03/24/2024 IMPRESSION: 1. Cirrhosis and splenomegaly. 2. Very tiny lesion described by prior examinations in the posterior liver dome, hepatic segment VII, is not clearly appreciated  on today's examination, noting the technical limitation that the earliest phase of contrast provided for review labeled 30 seconds is in portal venous phase. No new or suspicious liver lesions. 3. Unchanged fluid signal cystic lesion in the dorsal pancreatic head measuring 1.1 x 0.6 cm. No solid lesion or suspicious contrast enhancement. No pancreatic ductal dilatation or surrounding inflammatory changes. 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 Given well established imaging stability and patient age substantially greater than 80 years, as well as significant medical comorbidities, no specific further follow-up or characterization is required. 4. Chronic narrowing of the superior mesenteric vein, in keeping with chronic sequelae of previously seen thrombus.      Bowel habit changes - Bowel irregularities for the past three weeks, characterized by alternating constipation and greasy, grainy stools - Occasional small spots of blood in the stool, not in significant amounts - Stool is dark but not black, attributed to iron supplement use - No oil droplets in the stool or clear signs of stearrhea - Typical bowel movement frequency is daily, but consistency and timing have become irregular  Epigastric pain - Intermittent epigastric pain for the last three weeks - Pain described as 'real gaseous kind' - Pain sometimes severe enough to disrupt sleep - Pantoprazole  taken once daily instead of prescribed twice daily - No correlation between trazodone use and onset of abdominal pain  Sleep disturbance - Recent switch from melatonin to trazodone for sleep issues - Trazodone taken at half dose, effective for sleep  Lightheadedness and presyncope - Episodes of lightheadedness and near-fainting spells - Attributed to potential blood sugar issues - Monitors blood sugar levels (ok) in the morning but not with episodes - On Jardiance  for CHF and diabetes management  Anticoagulation and  iron supplementation - On Xarelto  for superior mesenteric vein thrombosis and atrial fibrillation - Manages iron levels due to previous deficiencies  -Continues to get parenteral iron supplementation through hematology oncology at [facility] with last IV Monoferric  06/22/2024.      Wt Readings from Last 3 Encounters:  10/02/24 178 lb (80.7 kg)  08/25/24 185 lb (83.9 kg)  08/11/24 178 lb 12.8 oz (81.1 kg)      Latest Ref Rng & Units 08/19/2024   10:07 AM 06/01/2024   11:15 AM 03/25/2024   10:40 AM  CBC  WBC 4.0 - 10.5 K/uL 2.5  2.7  2.7   Hemoglobin 12.0 - 15.0 g/dL 89.3  89.9  89.4   Hematocrit 36.0 - 46.0 % 33.7  33.3  33.1   Platelets 150 - 400 K/uL 85  94  93    Lab Results  Component Value Date   IRON 59 08/19/2024   TIBC 311 08/19/2024   FERRITIN 28 08/19/2024     Allergies  Allergen Reactions   Latex Anaphylaxis    Other Reaction(s): Not available  latex   Codeine Palpitations    Other Reaction(s): Not available  codeine   Current Meds  Medication Sig   albuterol  (VENTOLIN  HFA) 108 (90 Base) MCG/ACT inhaler Inhale 1 puff into the lungs as needed.   ascorbic acid (VITAMIN C) 500 MG tablet Take 500 mg by mouth daily.   Biotin 1000 MCG tablet Take 1,000 mcg by mouth daily.   Cholecalciferol 50 MCG (2000 UT) TABS Take 2,000 Units by mouth  daily.   diphenhydrAMINE -APAP, sleep, (TYLENOL  PM EXTRA STRENGTH PO) Take 1 tablet by mouth as needed.   empagliflozin  (JARDIANCE ) 10 MG TABS tablet TAKE 1 TABLET BY MOUTH ONCE DAILY BEFORE BREAKFAST   furosemide  (LASIX ) 40 MG tablet Take 1 tablet (40 mg total) by mouth daily.   hydrOXYzine (ATARAX) 25 MG tablet Take 25 mg by mouth at bedtime.   lactulose  (CHRONULAC ) 10 GM/15ML solution Take 10 g by mouth as needed.   levothyroxine  (SYNTHROID ) 75 MCG tablet Take 75 mcg by mouth daily before breakfast.    lidocaine -prilocaine  (EMLA ) cream Apply 1 Application topically as needed. Apply to skin over port 30 minutes before  port flush lab draws   magnesium  30 MG tablet Take 400 mg by mouth daily.   Multiple Vitamins-Minerals (PRESERVISION AREDS PO) Take 1 tablet by mouth daily.   nadolol  (CORGARD ) 20 MG tablet Take 0.5 tablets (10 mg total) by mouth daily.   potassium chloride  SA (KLOR-CON  M) 20 MEQ tablet Take 1 tablet (20 mEq total) by mouth daily.   pramipexole  (MIRAPEX ) 0.75 MG tablet Take 0.75 mg by mouth every evening.   spironolactone  (ALDACTONE ) 50 MG tablet Take 50 mg by mouth every other day.   traZODone (DESYREL) 50 MG tablet Take 50 mg by mouth at bedtime. (Patient taking differently: Take 25 mg by mouth at bedtime. Pt taking a half of tablet)   triamcinolone cream (KENALOG) 0.1 % Apply topically.   vitamin B-12 (CYANOCOBALAMIN) 1000 MCG tablet Take 1,000 mcg by mouth daily.   XARELTO  20 MG TABS tablet Take 1 tablet (20 mg total) by mouth daily. Restart on 05/09/23   [DISCONTINUED] ferrous sulfate  325 (65 FE) MG tablet Take 1 tablet (325 mg total) by mouth 2 (two) times daily with a meal. (Patient taking differently: Take 325 mg by mouth daily with breakfast.)   [DISCONTINUED] pantoprazole  (PROTONIX ) 40 MG tablet Take 1 tablet (40 mg total) by mouth 2 (two) times daily before a meal.   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 10/09/2020   Pancreatic cysts 10/09/2020   Portal hypertension (HCC)-gastropathy and colopathy 10/09/2020   Sleep apnea    Thrombosis of mesenteric vein (HCC) SMV    SMV   Thyroid disease    Vestibular migraine    Past Surgical History:  Procedure Laterality Date   ABDOMINAL HYSTERECTOMY  1987   BIOPSY  05/06/2023   Procedure: BIOPSY;  Surgeon: Cindie Carlin POUR,  DO;  [facility];  Service: Endoscopy;;   COLON RESECTION     COLONOSCOPY     COLONOSCOPY WITH PROPOFOL  N/A 05/06/2023   Procedure: COLONOSCOPY WITH PROPOFOL ;  Surgeon: Cindie Carlin POUR, DO;  [facility];  Service: Endoscopy;  Laterality: N/A;   ESOPHAGOGASTRODUODENOSCOPY (EGD) WITH PROPOFOL  N/A 05/06/2023   Procedure: ESOPHAGOGASTRODUODENOSCOPY (EGD) WITH PROPOFOL ;  Surgeon: Cindie Carlin POUR, DO;  [facility];  Service: Endoscopy;  Laterality: N/A;   HOT HEMOSTASIS  05/06/2023   Procedure: HOT HEMOSTASIS (ARGON PLASMA COAGULATION/BICAP);  Surgeon: Cindie Carlin POUR, DO;  [facility];  Service: Endoscopy;;   POLYPECTOMY  05/06/2023   Procedure: POLYPECTOMY;  Surgeon: Cindie Carlin POUR, DO;  [facility];  Service: Endoscopy;;   PORTACATH PLACEMENT     Never removed placed at time of colon cancer treatment   SHOULDER ARTHROSCOPY     left   TOTAL KNEE ARTHROPLASTY Left 06/08/2019   Procedure: TOTAL KNEE ARTHROPLASTY;  Surgeon: Melodi Lerner, MD;  Location: [facility];  Service: Orthopedics;  Laterality: Left;    UPPER GASTROINTESTINAL ENDOSCOPY     Social History   Social History Narrative   Married, 1 son one daughter. Daughter is a publishing rights manager.   She is retired   2 cups coffee per day   family history includes Alzheimer's disease in her mother; Bone cancer in her brother and brother; Diabetes in her brother and mother; Heart attack in her brother; Heart disease in her brother; Lung cancer in her brother, brother, and sister; Prostate cancer in her brother; Ulcerative colitis in her sister.   Review of Systems As above She reports a topical therapy for dermatitis has greatly lessened previous irritation and itching and rash in lower extremities.  Objective:   Physical Exam @BP  112/60   Pulse 68   Ht 5' 5.5 (1.664 m)   Wt 178 lb (80.7 kg)   BMI 29.17 kg/m @  General:  NAD Eyes:   anicteric Lungs:   clear Heart::  S1S2 no rubs, murmurs or gallops Abdomen:  soft and mildly tender epigastrium without clear HSM or ascites BS+ Ext:   Trace bilat LE edema, cyanosis or clubbing there are changes of chronic venous stasis and some varicose veins.  No erythematous rash as before  A and o x 3 and no asterixis    Data Reviewed:  See HPI  I spent 61 minutes of time, including in depth chart review, independent review of results as outlined above, communicating results with the patient directly, face-to-face time with the patient, coordinating care, ordering studies and medications as appropriate, and documentation.

## 2024-10-02 NOTE — Patient Instructions (Addendum)
 Stop vitamin E as we discussed  Get the flu vaccine. Consider Covid vaccine.  Diet Instructions for Patients with Cirrhosis Eating well is very important for people with cirrhosis. Good nutrition can help you feel better, keep your muscles strong, and lower your risk of complications.[1-3] Eat enough calories and protein: Aim for about 35 calories per kilogram of your body weight each day, and 1.2-1.5 grams of protein per kilogram. Protein helps keep your muscles strong. Do not avoid protein, even if you have had confusion or hepatic encephalopathy in the past.[1-2][4-6] Choose a variety of protein sources: Include lean meats, fish, eggs, dairy, beans, nuts, and lentils. Vegetable and dairy proteins are especially helpful.[1][7]Branched-chain amino acid-rich foods (e.g., dairy, legumes, fish) may be particularly beneficial. Eat small, frequent meals: Try to eat every 3-4 hours while awake. Have a snack before bed, such as yogurt, cheese, or a sandwich, to help prevent muscle loss overnight.[1-3][5] Practical options include yogurt, protein bars, rice balls, nuts, beans, or fish, and should be tailored to patient preferences and local food availability.[1][6-8]  Limit salt (sodium) if you have swelling or fluid in your belly (ascites): Use less than 2,000 mg of sodium per day. Avoid salty foods like canned soups, processed meats, chips, and restaurant meals. Read food labels for sodium content.[2] Drink fluids as usual, unless your doctor tells you to restrict fluids. Only restrict fluids if your sodium level in the blood is very low.[2] Take vitamin and mineral supplements if recommended: People with cirrhosis often need extra vitamins and minerals, especially zinc , vitamin D, and thiamine. Your doctor may check your levels and suggest supplements.[6][8-9] Avoid alcohol completely: Alcohol can make liver damage worse.[8] Stay active: Gentle exercise, like walking or light resistance training, can  help keep your muscles strong.[4][7-8] See a dietitian if possible: A nutrition expert can help you make a meal plan that fits your needs.[1] If you have trouble eating enough or are losing weight, tell your healthcare team. They may suggest nutrition drinks or other ways to help you get the calories and protein you need.[1-3] Remember, eating well is a key part of managing cirrhosis and staying healthy.  Your provider has requested that you go to the basement level for lab work before leaving today. Press B on the elevator. The lab is located at the first door on the left as you exit the elevator.  Due to recent changes in healthcare laws, you may see the results of your imaging and laboratory studies on MyChart before your provider has had a chance to review them.  We understand that in some cases there may be results that are confusing or concerning to you. Not all laboratory results come back in the same time frame and the provider may be waiting for multiple results in order to interpret others.  Please give us  48 hours in order for your provider to thoroughly review all the results before contacting the office for clarification of your results.   You have been scheduled for an MRI at [facility]  on November 17th. Your appointment time is 5:45 PM Please arrive to ED entrance 30 minutes prior to your appointment time for registration purposes. Please make certain not to have anything to eat or drink 4 hours prior to your test. In addition, if you have any metal in your body, have a pacemaker or defibrillator, please be sure to let your ordering physician know. This test typically takes 45 minutes to 1 hour to complete. Should you need  to reschedule, please call [phone] to do so.  I appreciate the opportunity to care for you. Lupita Commander, MD, [facility]

## 2024-10-05 ENCOUNTER — Ambulatory Visit (HOSPITAL_COMMUNITY)
Admission: RE | Admit: 2024-10-05 | Discharge: 2024-10-05 | Disposition: A | Discharge status: Home / Self Care | Payer: MEDICARE | Source: Ambulatory Visit | Attending: Internal Medicine | Admitting: Internal Medicine

## 2024-10-05 ENCOUNTER — Other Ambulatory Visit: Payer: Self-pay | Admitting: Internal Medicine

## 2024-10-05 DIAGNOSIS — K7581 Nonalcoholic steatohepatitis (NASH): Secondary | ICD-10-CM | POA: Diagnosis present

## 2024-10-05 DIAGNOSIS — K862 Cyst of pancreas: Secondary | ICD-10-CM | POA: Diagnosis present

## 2024-10-05 DIAGNOSIS — K7469 Other cirrhosis of liver: Secondary | ICD-10-CM

## 2024-10-05 DIAGNOSIS — R1013 Epigastric pain: Secondary | ICD-10-CM

## 2024-10-05 LAB — AFP TUMOR MARKER: AFP-Tumor Marker: 2.9 ng/mL

## 2024-10-05 MED ORDER — GADOBUTROL 1 MMOL/ML IV SOLN
8.0000 mL | Freq: Once | INTRAVENOUS | Status: AC | PRN
  Administered 2024-10-05: 8 mL via INTRAVENOUS

## 2024-10-11 ENCOUNTER — Ambulatory Visit: Payer: Self-pay | Admitting: Internal Medicine

## 2024-10-23 ENCOUNTER — Inpatient Hospital Stay: Payer: MEDICARE

## 2024-10-27 ENCOUNTER — Inpatient Hospital Stay: Payer: MEDICARE

## 2024-11-16 ENCOUNTER — Encounter: Payer: Self-pay | Admitting: *Deleted

## 2024-11-17 ENCOUNTER — Inpatient Hospital Stay: Payer: MEDICARE | Attending: Hematology

## 2024-11-17 ENCOUNTER — Ambulatory Visit: Payer: Self-pay | Admitting: Physician Assistant

## 2024-11-17 DIAGNOSIS — D509 Iron deficiency anemia, unspecified: Secondary | ICD-10-CM | POA: Diagnosis present

## 2024-11-17 DIAGNOSIS — D500 Iron deficiency anemia secondary to blood loss (chronic): Secondary | ICD-10-CM

## 2024-11-17 LAB — CBC WITH DIFFERENTIAL/PLATELET
Abs Immature Granulocytes: 0.01 K/uL (ref 0.00–0.07)
Basophils Absolute: 0 K/uL (ref 0.0–0.1)
Basophils Relative: 0 %
Eosinophils Absolute: 0.2 K/uL (ref 0.0–0.5)
Eosinophils Relative: 4 %
HCT: 37.7 % (ref 36.0–46.0)
Hemoglobin: 11.7 g/dL — ABNORMAL LOW (ref 12.0–15.0)
Immature Granulocytes: 0 %
Lymphocytes Relative: 12 %
Lymphs Abs: 0.5 K/uL — ABNORMAL LOW (ref 0.7–4.0)
MCH: 28.1 pg (ref 26.0–34.0)
MCHC: 31 g/dL (ref 30.0–36.0)
MCV: 90.6 fL (ref 80.0–100.0)
Monocytes Absolute: 0.2 K/uL (ref 0.1–1.0)
Monocytes Relative: 6 %
Neutro Abs: 2.9 K/uL (ref 1.7–7.7)
Neutrophils Relative %: 78 %
Platelets: 97 K/uL — ABNORMAL LOW (ref 150–400)
RBC: 4.16 MIL/uL (ref 3.87–5.11)
RDW: 15.4 % (ref 11.5–15.5)
WBC: 3.8 K/uL — ABNORMAL LOW (ref 4.0–10.5)
nRBC: 0 % (ref 0.0–0.2)

## 2024-11-17 LAB — COMPREHENSIVE METABOLIC PANEL WITH GFR
ALT: 20 U/L (ref 0–44)
AST: 28 U/L (ref 15–41)
Albumin: 4.2 g/dL (ref 3.5–5.0)
Alkaline Phosphatase: 95 U/L (ref 38–126)
Anion gap: 13 (ref 5–15)
BUN: 19 mg/dL (ref 8–23)
CO2: 23 mmol/L (ref 22–32)
Calcium: 9 mg/dL (ref 8.9–10.3)
Chloride: 104 mmol/L (ref 98–111)
Creatinine, Ser: 0.76 mg/dL (ref 0.44–1.00)
GFR, Estimated: >60 mL/min
Glucose, Bld: 132 mg/dL — ABNORMAL HIGH (ref 70–99)
Potassium: 4 mmol/L (ref 3.5–5.1)
Sodium: 140 mmol/L (ref 135–145)
Total Bilirubin: 0.9 mg/dL (ref 0.0–1.2)
Total Protein: 6.4 g/dL — ABNORMAL LOW (ref 6.5–8.1)

## 2024-11-17 LAB — IRON AND TIBC
Iron: 52 ug/dL (ref 28–170)
Saturation Ratios: 17 % (ref 10.4–31.8)
TIBC: 316 ug/dL (ref 250–450)
UIBC: 264 ug/dL

## 2024-11-17 LAB — FERRITIN: Ferritin: 39 ng/mL (ref 11–307)

## 2024-11-17 NOTE — Progress Notes (Signed)
 Port flushed with good blood return noted. No bruising or swelling at site. Bandaid applied and patient discharged in satisfactory condition. VVS stable with no signs or symptoms of distressed noted.

## 2024-11-17 NOTE — Patient Instructions (Signed)
 [facility] [place] - A DEPT OF [facility]  Discharge Instructions: Thank you for choosing [facility] to provide your oncology and hematology care.  If you have a lab appointment with the Cancer Center - please note that after April 8th, 2024, all labs will be drawn in the cancer center.  You do not have to check in or register with the main entrance as you have in the past but will complete your check-in in the cancer center.  Wear comfortable clothing and clothing appropriate for easy access to any Portacath or PICC line.   We strive to give you quality time with your provider. You may need to reschedule your appointment if you arrive late (15 or more minutes).  Arriving late affects you and other patients whose appointments are after yours.  Also, if you miss three or more appointments without notifying the office, you may be dismissed from the clinic at the provider's discretion.      For prescription refill requests, have your pharmacy contact our office and allow 72 hours for refills to be completed.    Today you received the following port flush with labs. Return as scheduled.   To help prevent nausea and vomiting after your treatment, we encourage you to take your nausea medication as directed.  BELOW ARE SYMPTOMS THAT SHOULD BE REPORTED IMMEDIATELY: *FEVER GREATER THAN 100.4 F (38 C) OR HIGHER *CHILLS OR SWEATING *NAUSEA AND VOMITING THAT IS NOT CONTROLLED WITH YOUR NAUSEA MEDICATION *UNUSUAL SHORTNESS OF BREATH *UNUSUAL BRUISING OR BLEEDING *URINARY PROBLEMS (pain or burning when urinating, or frequent urination) *BOWEL PROBLEMS (unusual diarrhea, constipation, pain near the anus) TENDERNESS IN MOUTH AND THROAT WITH OR WITHOUT PRESENCE OF ULCERS (sore throat, sores in mouth, or a toothache) UNUSUAL RASH, SWELLING OR PAIN  UNUSUAL VAGINAL DISCHARGE OR ITCHING   Items with * indicate a potential emergency and should be followed up as soon as  possible or go to the Emergency Department if any problems should occur.  Please show the CHEMOTHERAPY ALERT CARD or IMMUNOTHERAPY ALERT CARD at check-in to the Emergency Department and triage nurse.  Should you have questions after your visit or need to cancel or reschedule your appointment, please contact [facility] [place] - A DEPT OF [facility] [phone]  and follow the prompts.  Office hours are 8:00 a.m. to 4:30 p.m. Monday - Friday. Please note that voicemails left after 4:00 p.m. may not be returned until the following business day.  We are closed weekends and major holidays. You have access to a nurse at all times for urgent questions. Please call the main number to the clinic [phone] and follow the prompts.  For any non-urgent questions, you may also contact your provider using MyChart. We now offer e-Visits for anyone 68 and older to request care online for non-urgent symptoms. For details visit [email or web address].   Also download the MyChart app! Go to the app store, search MyChart, open the app, select [facility], and log in with your MyChart username and password.

## 2024-11-17 NOTE — Progress Notes (Signed)
 NURSES: Please call patient to go over lab results. - Offer reassurance that low WBC and low platelets are within expected range for her liver cirrhosis. - She has low iron and low hemoglobin.  We will give IV iron x 1 dose. - Otherwise, follow-up as scheduled in February 2026.  SCHEDULERS: I have entered orders for Monoferric  x 1.  Please schedule appropriately.  Pleasant CHRISTELLA Barefoot, PA-C 11/17/2024 6:09 PM

## 2024-11-18 LAB — MISC LABCORP TEST (SEND OUT): Labcorp test code: 121251

## 2024-11-18 NOTE — Telephone Encounter (Signed)
-----   Message from Pleasant CHRISTELLA Barefoot sent at 11/17/2024  6:09 PM EST ----- NURSES: Please call patient to go over lab results. - Offer reassurance that low WBC and low platelets are within expected range for her liver cirrhosis. - She has low iron and low hemoglobin.  We will give IV iron x 1 dose. - Otherwise, follow-up as scheduled in February 2026.  SCHEDULERS: I have entered orders for Monoferric  x 1.  Please schedule appropriately.  Pleasant CHRISTELLA Barefoot, PA-C 11/17/2024 6:09 PM

## 2024-11-18 NOTE — Telephone Encounter (Signed)
 I spoke with patient this morning and advised of her recent lab results. She verbalized understanding and wishes to proceed with plan for iron.  She is aware that schedulers will call her with the upcoming appt. All questions answered to patient satisfaction.

## 2024-11-23 ENCOUNTER — Inpatient Hospital Stay: Payer: MEDICARE | Attending: Hematology

## 2024-11-23 VITALS — BP 121/55 | HR 58 | Temp 96.4°F | Resp 18 | Wt 184.0 lb

## 2024-11-23 DIAGNOSIS — D509 Iron deficiency anemia, unspecified: Secondary | ICD-10-CM | POA: Insufficient documentation

## 2024-11-23 MED ORDER — SODIUM CHLORIDE 0.9 % IV SOLN
1000.0000 mg | Freq: Once | INTRAVENOUS | Status: AC
  Administered 2024-11-23: 1000 mg via INTRAVENOUS
  Filled 2024-11-23: qty 1000

## 2024-11-23 MED ORDER — CETIRIZINE HCL 10 MG/ML IV SOLN
5.0000 mg | Freq: Once | INTRAVENOUS | Status: AC
  Administered 2024-11-23: 5 mg via INTRAVENOUS
  Filled 2024-11-23: qty 1

## 2024-11-23 MED ORDER — ACETAMINOPHEN 325 MG PO TABS
650.0000 mg | ORAL_TABLET | Freq: Once | ORAL | Status: AC
  Administered 2024-11-23: 650 mg via ORAL
  Filled 2024-11-23: qty 2

## 2024-11-23 MED ORDER — SODIUM CHLORIDE 0.9 % IV SOLN: Freq: Once | INTRAVENOUS | Status: AC

## 2024-11-23 NOTE — Progress Notes (Signed)
Patient presents today for iron infusion. Patient is in satisfactory condition with no new complaints voiced.  Vital signs are stable.  We will proceed with infusion per provider orders.    Peripheral IV started with good blood return pre and post infusion.  Monoferric 1,000 mg given today per MD orders. Tolerated infusion without adverse affects. Vital signs stable. No complaints at this time. Discharged from clinic ambulatory in stable condition. Alert and oriented x 3. F/U with [facility] as scheduled.

## 2024-11-23 NOTE — Patient Instructions (Signed)
 [facility] [place] - A DEPT OF [facility]  Discharge Instructions: Thank you for choosing [facility] to provide your oncology and hematology care.  If you have a lab appointment with the Cancer Center - please note that after April 8th, 2024, all labs will be drawn in the cancer center.  You do not have to check in or register with the main entrance as you have in the past but will complete your check-in in the cancer center.  Wear comfortable clothing and clothing appropriate for easy access to any Portacath or PICC line.   We strive to give you quality time with your provider. You may need to reschedule your appointment if you arrive late (15 or more minutes).  Arriving late affects you and other patients whose appointments are after yours.  Also, if you miss three or more appointments without notifying the office, you may be dismissed from the clinic at the provider's discretion.      For prescription refill requests, have your pharmacy contact our office and allow 72 hours for refills to be completed.    Today you received Monoferric IV iron infusion.     BELOW ARE SYMPTOMS THAT SHOULD BE REPORTED IMMEDIATELY: *FEVER GREATER THAN 100.4 F (38 C) OR HIGHER *CHILLS OR SWEATING *NAUSEA AND VOMITING THAT IS NOT CONTROLLED WITH YOUR NAUSEA MEDICATION *UNUSUAL SHORTNESS OF BREATH *UNUSUAL BRUISING OR BLEEDING *URINARY PROBLEMS (pain or burning when urinating, or frequent urination) *BOWEL PROBLEMS (unusual diarrhea, constipation, pain near the anus) TENDERNESS IN MOUTH AND THROAT WITH OR WITHOUT PRESENCE OF ULCERS (sore throat, sores in mouth, or a toothache) UNUSUAL RASH, SWELLING OR PAIN  UNUSUAL VAGINAL DISCHARGE OR ITCHING   Items with * indicate a potential emergency and should be followed up as soon as possible or go to the Emergency Department if any problems should occur.  Please show the CHEMOTHERAPY ALERT CARD or IMMUNOTHERAPY ALERT CARD  at check-in to the Emergency Department and triage nurse.  Should you have questions after your visit or need to cancel or reschedule your appointment, please contact [facility] [place] - A DEPT OF Eligha Bridegroom [facility] [phone]  and follow the prompts.  Office hours are 8:00 a.m. to 4:30 p.m. Monday - Friday. Please note that voicemails left after 4:00 p.m. may not be returned until the following business day.  We are closed weekends and major holidays. You have access to a nurse at all times for urgent questions. Please call the main number to the clinic [phone] and follow the prompts.  For any non-urgent questions, you may also contact your provider using MyChart. We now offer e-Visits for anyone 35 and older to request care online for non-urgent symptoms. For details visit [email or web address].   Also download the MyChart app! Go to the app store, search "MyChart", open the app, select [place], and log in with your MyChart username and password.

## 2024-12-05 ENCOUNTER — Encounter: Payer: Self-pay | Admitting: Internal Medicine

## 2024-12-07 ENCOUNTER — Other Ambulatory Visit: Payer: Self-pay | Admitting: Cardiology

## 2024-12-23 ENCOUNTER — Inpatient Hospital Stay: Payer: MEDICARE | Attending: Hematology | Admitting: Physician Assistant

## 2024-12-23 ENCOUNTER — Inpatient Hospital Stay: Payer: MEDICARE

## 2024-12-23 VITALS — BP 145/67 | HR 64 | Temp 96.7°F | Resp 18 | Wt 185.0 lb

## 2024-12-23 DIAGNOSIS — D500 Iron deficiency anemia secondary to blood loss (chronic): Secondary | ICD-10-CM | POA: Diagnosis not present

## 2024-12-23 DIAGNOSIS — K55069 Acute infarction of intestine, part and extent unspecified: Secondary | ICD-10-CM | POA: Diagnosis not present

## 2024-12-23 LAB — CBC WITH DIFFERENTIAL/PLATELET
Abs Immature Granulocytes: 0.01 10*3/uL (ref 0.00–0.07)
Basophils Absolute: 0 10*3/uL (ref 0.0–0.1)
Basophils Relative: 1 %
Eosinophils Absolute: 0.3 10*3/uL (ref 0.0–0.5)
Eosinophils Relative: 8 %
HCT: 40.6 % (ref 36.0–46.0)
Hemoglobin: 12.8 g/dL (ref 12.0–15.0)
Immature Granulocytes: 0 %
Lymphocytes Relative: 15 %
Lymphs Abs: 0.6 10*3/uL — ABNORMAL LOW (ref 0.7–4.0)
MCH: 28.7 pg (ref 26.0–34.0)
MCHC: 31.5 g/dL (ref 30.0–36.0)
MCV: 91 fL (ref 80.0–100.0)
Monocytes Absolute: 0.3 10*3/uL (ref 0.1–1.0)
Monocytes Relative: 8 %
Neutro Abs: 2.8 10*3/uL (ref 1.7–7.7)
Neutrophils Relative %: 68 %
Platelets: 110 10*3/uL — ABNORMAL LOW (ref 150–400)
RBC: 4.46 MIL/uL (ref 3.87–5.11)
RDW: 16 % — ABNORMAL HIGH (ref 11.5–15.5)
WBC: 4.1 10*3/uL (ref 4.0–10.5)
nRBC: 0 % (ref 0.0–0.2)

## 2024-12-23 LAB — IRON AND TIBC
Iron: 68 ug/dL (ref 28–170)
Saturation Ratios: 24 % (ref 10.4–31.8)
TIBC: 281 ug/dL (ref 250–450)
UIBC: 214 ug/dL

## 2024-12-23 LAB — FERRITIN: Ferritin: 200 ng/mL (ref 11–307)

## 2024-12-23 NOTE — Patient Instructions (Signed)
 [facility] at [facility] **VISIT SUMMARY & IMPORTANT INSTRUCTIONS **   You were seen today by Pleasant Barefoot PA-C for your follow-up visit.    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 Your blood and iron levels look better after your most recent IV iron. You are at risk for recurrent iron deficiency anemia in the future due to slow/intermittent blood loss from your stomach or intestines. Seek immediate medical attention if you have worsening symptoms of anemia such as profound weakness, chest pain, lightheadedness, passing out, or difficulty breathing. Continue to take your over-the-counter iron supplement (ferrous sulfate  325 mg) once daily.  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  Your most recent CT scan showed that this blood clot has resolved! From hematology standpoint, you no longer need Xarelto  for treatment of this blood clot. However, you should CONTINUE to take Xarelto  for the time being, since you may need to be on blood thinner from your cardiologist (due to atrial fibrillation). Pay close attention for any signs or symptoms of bleeding.  LOW PLATELETS & WHITE BLOOD CELLS: Your low platelets and low white blood cells are related to your liver cirrhosis and enlarged spleen.  At this time, your levels are within an expected range for your liver disease.  If you have severe worsening of platelets and white blood cells beyond what is expected from liver disease, we would consider bone marrow biopsy to rule out other conditions.  FOLLOW-UP APPOINTMENT: 3 months  ** Thank you for trusting me with your healthcare!  I strive to provide all of my patients with quality care at each visit.  If you receive a survey for this visit, I would be so grateful to you for taking the time to provide feedback.  Thank  you in advance!  ~ Melford Tullier                                        Dr. Mickiel Davonna Pleasant Barefoot, PA-C   Delon Hope, NP   - - - - - - - - - - - - - - - - - -     Thank you for choosing  [facility] to provide your oncology and hematology care.  To afford each patient quality time with our provider, please arrive at least 15 minutes before your scheduled appointment time.   If you have a lab appointment with the Cancer Center please come in thru the Main Entrance and check in at the main information desk.  You need to re-schedule your appointment should you arrive 10 or more minutes late.  We strive to give you quality time with our providers, and arriving late affects you and other patients whose appointments are after yours.  Also, if you no show three or more times for appointments you may be dismissed from the clinic at the providers discretion.     Again, thank you for choosing [facility].  Our hope is that these requests will decrease the amount of time that you wait before being seen by our physicians.       _____________________________________________________________  Should you have questions after your visit to [facility], please contact our  office at [phone] and follow the prompts.  Our office hours are 8:00 a.m. and 4:30 p.m. Monday - Friday.  Please note that voicemails left after 4:00 p.m. may not be returned until the following business day.  We are closed weekends and major holidays.  You do have access to a nurse 24-7, just call the main number to the clinic [phone] and do not press any options, hold on the line and a nurse will answer the phone.    For prescription refill requests, have your pharmacy contact our office and allow 72 hours.

## 2024-12-23 NOTE — Progress Notes (Signed)
 Patients port flushed without difficulty.  Good blood return noted with no bruising or swelling noted at site.  Labs drawn per orders.  VSS with discharge and left in satisfactory condition with no s/s of distress noted. All follow ups as scheduled.       Kierre Deines

## 2025-02-04 ENCOUNTER — Ambulatory Visit: Payer: MEDICARE | Admitting: Cardiology

## 2025-03-22 ENCOUNTER — Inpatient Hospital Stay: Payer: MEDICARE | Admitting: Physician Assistant

## 2025-03-22 ENCOUNTER — Inpatient Hospital Stay: Payer: MEDICARE
